# Patient Record
Sex: Male | Born: 1960
Health system: Southern US, Community
[De-identification: ages and names within clinical notes are randomized; demographics above are authoritative.]

## PROBLEM LIST (undated history)

## (undated) DIAGNOSIS — M1990 Unspecified osteoarthritis, unspecified site: Secondary | ICD-10-CM

## (undated) DIAGNOSIS — K649 Unspecified hemorrhoids: Secondary | ICD-10-CM

## (undated) DIAGNOSIS — C649 Malignant neoplasm of unspecified kidney, except renal pelvis: Secondary | ICD-10-CM

## (undated) DIAGNOSIS — K7689 Other specified diseases of liver: Secondary | ICD-10-CM

## (undated) HISTORY — PX: NECK SURGERY: SHX720

## (undated) HISTORY — DX: Malignant neoplasm of unspecified kidney, except renal pelvis: C64.9

---

## 2015-06-05 ENCOUNTER — Inpatient Hospital Stay
Admission: EM | Admit: 2015-06-05 | Discharge: 2015-06-07 | DRG: 700 | Disposition: A | Discharge status: Home / Self Care | Payer: BLUE CROSS/BLUE SHIELD | Attending: Internal Medicine | Admitting: Internal Medicine

## 2015-06-05 ENCOUNTER — Encounter: Payer: Self-pay | Admitting: Emergency Medicine

## 2015-06-05 DIAGNOSIS — N400 Enlarged prostate without lower urinary tract symptoms: Secondary | ICD-10-CM | POA: Diagnosis present

## 2015-06-05 DIAGNOSIS — Z833 Family history of diabetes mellitus: Secondary | ICD-10-CM

## 2015-06-05 DIAGNOSIS — N23 Unspecified renal colic: Secondary | ICD-10-CM | POA: Diagnosis present

## 2015-06-05 DIAGNOSIS — Z803 Family history of malignant neoplasm of breast: Secondary | ICD-10-CM

## 2015-06-05 DIAGNOSIS — K5900 Constipation, unspecified: Secondary | ICD-10-CM | POA: Diagnosis present

## 2015-06-05 DIAGNOSIS — N138 Other obstructive and reflux uropathy: Secondary | ICD-10-CM | POA: Diagnosis not present

## 2015-06-05 DIAGNOSIS — R310 Gross hematuria: Secondary | ICD-10-CM | POA: Diagnosis present

## 2015-06-05 DIAGNOSIS — N2889 Other specified disorders of kidney and ureter: Secondary | ICD-10-CM | POA: Diagnosis present

## 2015-06-05 DIAGNOSIS — R109 Unspecified abdominal pain: Secondary | ICD-10-CM | POA: Diagnosis not present

## 2015-06-05 DIAGNOSIS — R319 Hematuria, unspecified: Secondary | ICD-10-CM | POA: Diagnosis present

## 2015-06-05 DIAGNOSIS — K644 Residual hemorrhoidal skin tags: Secondary | ICD-10-CM | POA: Diagnosis present

## 2015-06-05 LAB — COMPREHENSIVE METABOLIC PANEL
ALT: 18 U/L (ref 17–63)
AST: 22 U/L (ref 15–41)
Albumin: 3.9 g/dL (ref 3.5–5.0)
Alkaline Phosphatase: 72 U/L (ref 38–126)
Anion gap: 9 (ref 5–15)
BUN: 16 mg/dL (ref 6–20)
CHLORIDE: 101 mmol/L (ref 101–111)
CO2: 26 mmol/L (ref 22–32)
Calcium: 8.8 mg/dL — ABNORMAL LOW (ref 8.9–10.3)
Creatinine, Ser: 1.75 mg/dL — ABNORMAL HIGH (ref 0.61–1.24)
GFR, EST AFRICAN AMERICAN: 50 mL/min — AB (ref >60)
GFR, EST NON AFRICAN AMERICAN: 43 mL/min — AB (ref >60)
Glucose, Bld: 177 mg/dL — ABNORMAL HIGH (ref 65–99)
POTASSIUM: 3.8 mmol/L (ref 3.5–5.1)
SODIUM: 136 mmol/L (ref 135–145)
Total Bilirubin: 0.9 mg/dL (ref 0.3–1.2)
Total Protein: 7.1 g/dL (ref 6.5–8.1)

## 2015-06-05 LAB — URINALYSIS COMPLETE WITH MICROSCOPIC (ARMC ONLY)
Bilirubin Urine: NEGATIVE
Glucose, UA: 150 mg/dL — AB
Ketones, ur: NEGATIVE mg/dL
LEUKOCYTES UA: NEGATIVE
Nitrite: NEGATIVE
PH: 6 (ref 5.0–8.0)
PROTEIN: 30 mg/dL — AB
SQUAMOUS EPITHELIAL / LPF: NONE SEEN
Specific Gravity, Urine: 1.017 (ref 1.005–1.030)

## 2015-06-05 LAB — CBC
HEMATOCRIT: 39.5 % — AB (ref 40.0–52.0)
Hemoglobin: 13.7 g/dL (ref 13.0–18.0)
MCH: 31.2 pg (ref 26.0–34.0)
MCHC: 34.6 g/dL (ref 32.0–36.0)
MCV: 90.2 fL (ref 80.0–100.0)
Platelets: 184 10*3/uL (ref 150–440)
RBC: 4.38 MIL/uL — AB (ref 4.40–5.90)
RDW: 13.2 % (ref 11.5–14.5)
WBC: 10.8 10*3/uL — AB (ref 3.8–10.6)

## 2015-06-05 LAB — LIPASE, BLOOD: LIPASE: 18 U/L — AB (ref 22–51)

## 2015-06-05 MED ORDER — ACETAMINOPHEN 650 MG RE SUPP: 650.0000 mg | Freq: Four times a day (QID) | RECTAL | Status: DC | PRN

## 2015-06-05 MED ORDER — ALBUTEROL SULFATE (2.5 MG/3ML) 0.083% IN NEBU: 2.5000 mg | INHALATION_SOLUTION | RESPIRATORY_TRACT | Status: DC | PRN

## 2015-06-05 MED ORDER — HYDROCODONE-ACETAMINOPHEN 5-325 MG PO TABS: 1.0000 | ORAL_TABLET | ORAL | Status: DC | PRN

## 2015-06-05 MED ORDER — POLYETHYLENE GLYCOL 3350 17 G PO PACK
17.0000 g | PACK | Freq: Every day | ORAL | Status: DC
  Administered 2015-06-05 – 2015-06-07 (×3): 17 g via ORAL
  Filled 2015-06-05 (×3): qty 1

## 2015-06-05 MED ORDER — ONDANSETRON HCL 4 MG PO TABS: 4.0000 mg | ORAL_TABLET | Freq: Four times a day (QID) | ORAL | Status: DC | PRN

## 2015-06-05 MED ORDER — ACETAMINOPHEN 10 MG/ML IV SOLN
1000.0000 mg | Freq: Four times a day (QID) | INTRAVENOUS | Status: AC
  Administered 2015-06-06 (×4): 1000 mg via INTRAVENOUS
  Filled 2015-06-05 (×4): qty 100

## 2015-06-05 MED ORDER — SENNOSIDES-DOCUSATE SODIUM 8.6-50 MG PO TABS
2.0000 | ORAL_TABLET | Freq: Every day | ORAL | Status: DC
  Administered 2015-06-05 – 2015-06-06 (×2): 2 via ORAL
  Filled 2015-06-05 (×2): qty 2

## 2015-06-05 MED ORDER — SODIUM CHLORIDE 0.9 % IV BOLUS (SEPSIS)
1000.0000 mL | Freq: Once | INTRAVENOUS | Status: AC
  Administered 2015-06-05: 1000 mL via INTRAVENOUS

## 2015-06-05 MED ORDER — ONDANSETRON HCL 4 MG/2ML IJ SOLN
4.0000 mg | INTRAMUSCULAR | Status: AC
  Administered 2015-06-05: 4 mg via INTRAVENOUS
  Filled 2015-06-05: qty 2

## 2015-06-05 MED ORDER — SODIUM CHLORIDE 0.9 % IV SOLN
INTRAVENOUS | Status: DC
  Administered 2015-06-06 – 2015-06-07 (×6): via INTRAVENOUS

## 2015-06-05 MED ORDER — SODIUM BICARBONATE 8.4 % IV SOLN
INTRAVENOUS | Status: DC
  Administered 2015-06-06 (×2): via INTRAVENOUS
  Filled 2015-06-05 (×6): qty 150

## 2015-06-05 MED ORDER — HYDROMORPHONE HCL 1 MG/ML IJ SOLN
1.0000 mg | INTRAMUSCULAR | Status: AC
  Administered 2015-06-05 (×2): 1 mg via INTRAVENOUS
  Filled 2015-06-05: qty 1

## 2015-06-05 MED ORDER — HYDROMORPHONE HCL 1 MG/ML IJ SOLN
1.0000 mg | INTRAMUSCULAR | Status: AC
  Administered 2015-06-05: 1 mg via INTRAVENOUS

## 2015-06-05 MED ORDER — MORPHINE SULFATE (PF) 2 MG/ML IV SOLN: 2.0000 mg | INTRAVENOUS | Status: DC | PRN

## 2015-06-05 MED ORDER — ONDANSETRON HCL 4 MG/2ML IJ SOLN: 4.0000 mg | Freq: Four times a day (QID) | INTRAMUSCULAR | Status: DC | PRN

## 2015-06-05 MED ORDER — ACETAMINOPHEN 325 MG PO TABS: 650.0000 mg | ORAL_TABLET | Freq: Four times a day (QID) | ORAL | Status: DC | PRN

## 2015-06-05 MED ORDER — HYDROMORPHONE HCL 2 MG PO TABS
2.0000 mg | ORAL_TABLET | ORAL | Status: DC | PRN
  Administered 2015-06-05 – 2015-06-06 (×2): 2 mg via ORAL
  Administered 2015-06-06: 13:00:00 4 mg via ORAL
  Administered 2015-06-06: 04:00:00 2 mg via ORAL
  Administered 2015-06-06 – 2015-06-07 (×5): 4 mg via ORAL
  Filled 2015-06-05: qty 1
  Filled 2015-06-05 (×2): qty 2
  Filled 2015-06-05: qty 1
  Filled 2015-06-05: qty 2
  Filled 2015-06-05: qty 1
  Filled 2015-06-05 (×3): qty 2

## 2015-06-05 MED ORDER — HYDROMORPHONE HCL 1 MG/ML IJ SOLN
INTRAMUSCULAR | Status: AC
  Administered 2015-06-05: 1 mg via INTRAVENOUS
  Filled 2015-06-05: qty 1

## 2015-06-05 MED ORDER — HYDROMORPHONE HCL 1 MG/ML IJ SOLN
0.5000 mg | INTRAMUSCULAR | Status: DC | PRN
  Administered 2015-06-06 – 2015-06-07 (×11): 0.5 mg via INTRAVENOUS
  Filled 2015-06-05 (×11): qty 1

## 2015-06-05 MED ORDER — SODIUM BICARBONATE 8.4 % IV SOLN
INTRAVENOUS | Status: DC
  Administered 2015-06-05: 23:00:00 via INTRAVENOUS
  Filled 2015-06-05 (×2): qty 50

## 2015-06-05 NOTE — H&P (Signed)
[facility] [place] NAME: Joseph May    MR#:  [id number]  DATE OF BIRTH:  01/11/1961  DATE OF ADMISSION:  06/05/2015  PRIMARY CARE PHYSICIAN: [name], MD   REQUESTING/REFERRING PHYSICIAN: Dr. Jacqualine Code  CHIEF COMPLAINT:   Chief Complaint  Patient presents with  . Flank Pain   left-sided flank pain and hematuria for 3 days.  HISTORY OF PRESENT ILLNESS:  Joseph May  is a 54 y.o. male with no past medical history. The patient to come to the ED due to worsening left-sided flank pain with hematuria. The patient traveled to [place] last week. He started to have left-sided flank pain and noticed the hematuria. CAT scan of abdomen and pelvis show left-sided kidney mass. He was treated with the pain control and IV fluid in [place] and was discharged. According to Dr. Jacqualine Code, on-call urologist suggested admitting patient, start bicarbonate and repeat CAT scan if renal function improves.  PAST MEDICAL HISTORY:  History reviewed. No pertinent past medical history.  PAST SURGICAL HISTORY:   Past Surgical History  Procedure Laterality Date  . Neck surgery      SOCIAL HISTORY:   Social History  Substance Use Topics  . Smoking status: Never Smoker   . Smokeless tobacco: Not on file  . Alcohol Use: Yes    FAMILY HISTORY:   Family History  Problem Relation Age of Onset  . Diabetes Mother   . Breast cancer Mother     DRUG ALLERGIES:  No Known Allergies  REVIEW OF SYSTEMS:  CONSTITUTIONAL: No fever, but has chills. No fatigue or weakness.  EYES: No blurred or double vision.  EARS, NOSE, AND THROAT: No tinnitus or ear pain.  RESPIRATORY: No cough, shortness of breath, wheezing or hemoptysis.  CARDIOVASCULAR: No chest pain, orthopnea, edema.  GASTROINTESTINAL: No nausea, vomiting, diarrhea but has left side abdominal pain.  GENITOURINARY: No dysuria, but has left-sided flank pain and hematuria.  ENDOCRINE: No polyuria,  nocturia,  HEMATOLOGY: No anemia, easy bruising or bleeding SKIN: No rash or lesion. MUSCULOSKELETAL: No joint pain or arthritis.   NEUROLOGIC: No tingling, numbness, weakness.  PSYCHIATRY: No anxiety or depression.   MEDICATIONS AT HOME:   Prior to Admission medications   Medication Sig Start Date End Date Taking? Authorizing Provider  acetaminophen (TYLENOL) 500 MG tablet Take 1,000 mg by mouth every 6 (six) hours as needed for mild pain or moderate pain.   Yes Historical Provider, MD  HYDROcodone-acetaminophen (NORCO) 7.5-325 MG per tablet Take 0.5-1 tablets by mouth every 4 (four) hours as needed for moderate pain or severe pain.   Yes Historical Provider, MD      VITAL SIGNS:  Blood pressure 163/99, pulse 110, temperature 99.9 F (37.7 C), temperature source Oral, resp. rate 20, height 6\' 1"  (1.854 m), weight 89.812 kg (198 lb), SpO2 99 %.  PHYSICAL EXAMINATION:  GENERAL:  55 y.o.-year-old patient lying in the bed with no acute distress.  EYES: Pupils equal, round, reactive to light and accommodation. No scleral icterus. Extraocular muscles intact.  HEENT: Head atraumatic, normocephalic. Oropharynx and nasopharynx clear.  NECK:  Supple, no jugular venous distention. No thyroid enlargement, no tenderness.  LUNGS: Normal breath sounds bilaterally, no wheezing, rales,rhonchi or crepitation. No use of accessory muscles of respiration.  CARDIOVASCULAR: S1, S2 normal. No murmurs, rubs, or gallops.  ABDOMEN: Soft, nontender, nondistended. Bowel sounds present. No organomegaly or mass.  EXTREMITIES: No pedal edema, cyanosis, or clubbing.  NEUROLOGIC: Cranial nerves II  through XII are intact. Muscle strength 5/5 in all extremities. Sensation intact. Gait not checked.  PSYCHIATRIC: The patient is alert and oriented x 3.  SKIN: No obvious rash, lesion, or ulcer.   LABORATORY PANEL:   CBC  Recent Labs Lab 06/05/15 1838  WBC 10.8*  HGB 13.7  HCT 39.5*  PLT 184    ------------------------------------------------------------------------------------------------------------------  Chemistries   Recent Labs Lab 06/05/15 1838  NA 136  K 3.8  CL 101  CO2 26  GLUCOSE 177*  BUN 16  CREATININE 1.75*  CALCIUM 8.8*  AST 22  ALT 18  ALKPHOS 72  BILITOT 0.9   ------------------------------------------------------------------------------------------------------------------  Cardiac Enzymes No results for input(s): TROPONINI in the last 168 hours. ------------------------------------------------------------------------------------------------------------------  RADIOLOGY:  No results found. CAT scan of abdomen and pelvis without IV contrast in [place] show thrombosis in the left intrarenal collecting system and suspected cortical or mass left kidney.  EKG:  No orders found for this or any previous visit.  IMPRESSION AND PLAN:   Left kidney mass Left mild hydronephrosis Acute renal failure Hematuria  The patient will be admitted to medical floor. I will keep patient nothing by mouth except for medications and water. I will start bicarbonate infusion per urologist the recommendation. Per on-call urologist, the patient need CAT scan of the abdomen and pelvis with contrast is bone kidney function improves tomorrow. Pain control and Zofran when necessary.  All the records are reviewed and case discussed with ED provider. Management plans discussed with the patient, family and they are in agreement.  CODE STATUS: Full code  TOTAL TIME TAKING CARE OF THIS PATIENT: 52 minutes.    Demetrios Loll M.D on 06/05/2015 at 9:00 PM  Between 7am to 6pm - Pager - [phone]  After 6pm go to [email or web address] - password [email or web address] [facility]  Office  [phone]  CC: Primary care physician; [name], MD

## 2015-06-05 NOTE — ED Notes (Signed)
Patient to ED with report of left flank pain that started on Friday, patient was seen in [place] and has paperwork that shows the details of admission. Wife reports that patient had been urinating blood and they assumed it was a kidney stone but they found a thrombus in the left intrarenal area as well as a suspected cortical mass of left kidney.

## 2015-06-05 NOTE — ED Notes (Signed)
Pt has left flank pain.  Pt was seen in hospital in [place] 2 days ago while on vacation.  Pt has disc and ct report...given to dr Jacqualine Code..  Pt taking hydrocodone without relief of pain.  No n/v/d.  Pt continues to have blood in urine.  Iv in place.  Family with pt.

## 2015-06-05 NOTE — ED Notes (Signed)
meds given   siderails up x 2 

## 2015-06-05 NOTE — ED Provider Notes (Signed)
[facility] Emergency Department Provider Note REMINDER - THIS NOTE IS NOT A FINAL MEDICAL RECORD UNTIL IT IS SIGNED. UNTIL THEN, THE CONTENT BELOW MAY REFLECT INFORMATION FROM A DOCUMENTATION TEMPLATE, NOT THE ACTUAL PATIENT VISIT. ____________________________________________  Time seen: Approximately 8:43 PM  I have reviewed the triage vital signs and the nursing notes.   HISTORY  Chief Complaint Flank Pain    HPI Joseph May is a 54 y.o. male presents for evaluation of a left kidney mass and left-sided pain. Patient reports that about 3 days ago in [place] he developed sudden onset of blood and clots in his urine with severe left flank pain. He was evaluated and admitted for concerns of a left renal mass and bleeding. He drove home from [place] and continues to have severe pain in the left flank. The patient denies fevers, occasionally feels slightly chilled. No nausea or vomiting. No previous medical history. No allergies. Reports is hydrocodone does not assist much with pain control.  Patient has CT report which is concerning for a possible, though not well visualized left cortical renal mass.  History reviewed. No pertinent past medical history.  Patient Active Problem List   Diagnosis Date Noted  . Left kidney mass 06/05/2015  . Hematuria 06/05/2015    Past Surgical History  Procedure Laterality Date  . Neck surgery      Current Outpatient Rx  Name  Route  Sig  Dispense  Refill  . acetaminophen (TYLENOL) 500 MG tablet   Oral   Take 1,000 mg by mouth every 6 (six) hours as needed for mild pain or moderate pain.         Marland Kitchen HYDROcodone-acetaminophen (NORCO) 7.5-325 MG per tablet   Oral   Take 0.5-1 tablets by mouth every 4 (four) hours as needed for moderate pain or severe pain.           Allergies Review of patient's allergies indicates no known allergies.  Family History  Problem Relation Age of Onset  . Diabetes Mother   .  Breast cancer Mother     Social History Social History  Substance Use Topics  . Smoking status: Never Smoker   . Smokeless tobacco: None  . Alcohol Use: Yes    Review of Systems Constitutional: No fever Eyes: No visual changes. ENT: No sore throat. Cardiovascular: Denies chest pain. Respiratory: Denies shortness of breath. Gastrointestinal:  No diarrhea.  No constipation. Genitourinary: Urgency, is able to urinate and states it is bloody but not passing any further clots. Musculoskeletal: Negative for back pain. Skin: Negative for rash. Neurological: Negative for headaches, focal weakness or numbness.  10-point ROS otherwise negative.  ____________________________________________   PHYSICAL EXAM:  VITAL SIGNS: ED Triage Vitals  Enc Vitals Group     BP 06/05/15 1835 146/84 mmHg     Pulse Rate 06/05/15 1835 104     Resp 06/05/15 1835 20     Temp 06/05/15 1835 99.9 F (37.7 C)     Temp Source 06/05/15 1835 Oral     SpO2 06/05/15 1835 99 %     Weight 06/05/15 1835 198 lb (89.812 kg)     Height 06/05/15 1835 6\' 1"  (1.854 m)     Head Cir --      Peak Flow --      Pain Score 06/05/15 1835 8     Pain Loc --      Pain Edu? --      Excl. in [place]? --  Constitutional: Alert and oriented. Well appearing and in no acute distress, but he does appear to be in moderate to severe pain. Eyes: Conjunctivae are normal. PERRL. EOMI. Head: Atraumatic. Nose: No congestion/rhinnorhea. Mouth/Throat: Mucous membranes are moist.  Oropharynx non-erythematous. Neck: No stridor.   Cardiovascular: Normal rate, regular rhythm. Grossly normal heart sounds.  Good peripheral circulation. Respiratory: Normal respiratory effort.  No retractions. Lungs CTAB. Gastrointestinal: Soft and nontender except for along the left flank. No distention. No abdominal bruits. Moderate left-sided CVA tenderness. No right-sided CVA tenderness. Musculoskeletal: No lower extremity tenderness nor edema.  No joint  effusions. Neurologic:  Normal speech and language. No gross focal neurologic deficits are appreciated. No gait instability. Skin:  Skin is warm, dry and intact. No rash noted. Psychiatric: Mood and affect are normal. Speech and behavior are normal.  ____________________________________________   LABS (all labs ordered are listed, but only abnormal results are displayed)  Labs Reviewed  LIPASE, BLOOD - Abnormal; Notable for the following:    Lipase 18 (*)    All other components within normal limits  COMPREHENSIVE METABOLIC PANEL - Abnormal; Notable for the following:    Glucose, Bld 177 (*)    Creatinine, Ser 1.75 (*)    Calcium 8.8 (*)    GFR calc non Af Amer 43 (*)    GFR calc Af Amer 50 (*)    All other components within normal limits  CBC - Abnormal; Notable for the following:    WBC 10.8 (*)    RBC 4.38 (*)    HCT 39.5 (*)    All other components within normal limits  URINALYSIS COMPLETEWITH MICROSCOPIC ([facility] ONLY) - Abnormal; Notable for the following:    Color, Urine YELLOW (*)    APPearance CLOUDY (*)    Glucose, UA 150 (*)    Hgb urine dipstick 3+ (*)    Protein, ur 30 (*)    Bacteria, UA RARE (*)    All other components within normal limits  URINE CULTURE   ____________________________________________  EKG   ____________________________________________  RADIOLOGY  We discussed imaging with Dr. Louis Meckel of urology, and given the patient's mild left-sided hydronephrosis and elevated creatinine he advised hydration and admission and advised against contrast-enhanced imaging, but would advise imaging possibly tomorrow if renal function improving. ____________________________________________   PROCEDURES  Procedure(s) performed: None  Critical Care performed: No  ____________________________________________   INITIAL IMPRESSION / ASSESSMENT AND PLAN / ED COURSE  Pertinent labs & imaging results that were available during my care of the patient were  reviewed by me and considered in my medical decision making (see chart for details).  Patient presents with ongoing severe left flank pain. Respiratory admitted in [place], and CT report that he sends is certainly concerning for renal mass. Dr. Louis Meckel of urology will see him. He has a low-grade temperature, though not conclusively a fever. Urinalysis was discussed with urology and doesn't frankly look to be infected. We will admit the patient to the hospital for ongoing urologic consultation, hydration, and further workup. He has significant pain issues, which is improving slowly with a lot of but will also require IV medication for pain control at this time. ____________________________________________   FINAL CLINICAL IMPRESSION(S) / ED DIAGNOSES  Final diagnoses:  Left renal mass  Left flank pain      Delman Kitten, MD 06/05/15 2122

## 2015-06-05 NOTE — Plan of Care (Signed)
Problem: Discharge Progression Outcomes Goal: Other Discharge Outcomes/Goals Outcome: Progressing Plan of care progress to goals: Discharge plan- discharge home, possibly in the AM per Dr. Louis Meckel Pain controlled- pt c/o left flank pain improved with PRN pain medications. Hemodynamically stable- VSS, continue to monitor labs. Complications controlled- Dr. Louis Meckel in to see patient, discussed MRI in the AM and possible discharge.  C/o constipation, laxatives ordered by MD and given per order, continue to monitor for results. Tolerating diet- no c/o nausea or vomiting  Activity- pt is ambulatory with steady gait.   Pt is A&O, wife was present during admission but then went home and will return in the AM. Pt c/o left flank pain, improved with PRN pain medication. C/o constipation, laxatives given per order, continue to monitor for results. MRI scheduled for in the AM. IVF infusing per MD order. Low Fall risk, pt will call if assistance is needed.

## 2015-06-05 NOTE — ED Notes (Signed)
Dr Jacqualine Code in with pt again. meds given again for pain.  Iv fluids infusing.

## 2015-06-05 NOTE — Consult Note (Signed)
I have been asked to see the patient by Dr. Lauralyn Primes, for evaluation and management of left renal mass.  History of present illness: 54 year old otherwise healthy white male who presented to the emergency department this afternoon with ongoing/progressive left flank pain. The patient was recently seen in an outside hospital, [place], for gross hematuria and left flank pain. He was admitted there for 1 night and underwent a noncontrast CT scan which demonstrated a suspicious renal lesion, likely cortical, in the left kidney. There appeared to be a density within the collecting system consistent with clot. The patient's kidney function was slightly diminished, and as such she was not given contrast. The patient was given pain medications and recommended that he follow-up closer to home, here in [place]. Currently, the patient states that he has had consistent left flank pressure/pain. The pain does not radiate down across the abdomen into the groin. He has no associated voiding symptoms. His urine has cleared for the most part, he is no longer passing hematuria or clots. However, he has had no relief of his pain since 2 days prior. The Dilaudid oral pain medication seems to be controlling his pain reasonably well. Yet, this afternoon his pain was significantly worse. The patient denies any associated fevers or chills. He denies any dysuria. He denies any abdominal pain. He denies any recent weight loss, night sweats, or diminished energy levels. He denies any changes to his bowel habits, visual changes or headaches, or muscle weakness. The patient's last bowel movement was 4 days prior. He denies any nausea or vomiting. The patient has no family history of kidney cancer or GU malignancies. He is a lifelong nonsmoker. He takes no medications and has no past medical history. He works as a Furniture conservator/restorer, Warden/ranger particles routinely. He has no exposure to analine dyes or organic  solvents. He is married and has one daughter, and one grandson. His wife is an Glass blower/designer at [employer], works predominantly at [place]: A 12 point comprehensive review of systems was obtained and is negative unless otherwise stated in the history of present illness.  Patient Active Problem List   Diagnosis Date Noted  . Left kidney mass 06/05/2015  . Hematuria 06/05/2015    No current facility-administered medications on file prior to encounter.   No current outpatient prescriptions on file prior to encounter.    History reviewed. No pertinent past medical history.  Past Surgical History  Procedure Laterality Date  . Neck surgery      Social History  Substance Use Topics  . Smoking status: Never Smoker   . Smokeless tobacco: None  . Alcohol Use: Yes    Family History  Problem Relation Age of Onset  . Diabetes Mother   . Breast cancer Mother     PE: Filed Vitals:   06/05/15 2015 06/05/15 2030 06/05/15 2100 06/05/15 2132  BP: 151/95 146/94 150/91 188/83  Pulse: 108 105 108 120  Temp:    100 F (37.8 C)  TempSrc:    Oral  Resp:    24  Height:    6\' 1"  (1.854 m)  Weight:      SpO2: 99% 94% 97% 100%   Patient appears to be in no acute distress  patient is alert and oriented x3 Atraumatic normocephalic head No cervical or supraclavicular lymphadenopathy appreciated No increased work of breathing, no audible wheezes/rhonchi Regular sinus rhythm/rate Abdomen is soft, nontender, nondistended, no CVA or suprapubic tenderness  Lower extremities are symmetric without appreciable edema Grossly neurologically intact No identifiable skin lesions   Recent Labs  06/05/15 1838  WBC 10.8*  HGB 13.7  HCT 39.5*    Recent Labs  06/05/15 1838  NA 136  K 3.8  CL 101  CO2 26  GLUCOSE 177*  BUN 16  CREATININE 1.75*  CALCIUM 8.8*   No results for input(s): LABPT, INR in the last 72 hours. No results for input(s): LABURIN in the last 72  hours. No results found for this or any previous visit.  Imaging: I've independently reviewed the patient's CT scan from the outside hospital which is a noncontrast abdomen/pelvis. There is what appears to be a 5 cm intraoral polar mass emanating from the renal cortex, however there is no contrast making the distinction difficult to visualize. There is a increased density within the renal pelvis. There is no ureterohydronephrosis. The right kidney looks normal. There are no kidney stones. There is no appreciable lymphadenopathy. 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  Imp: The patient has a left renal mass poorly characterized on the current noncontrast CT scan. He has associated renal colic from the hematuria presumably coming from the mass. This likely happened because of dehydration while on vacation at the [place] in [place].  Recommendations: The patient certainly needs more definitive imaging. Given the densities within the collecting system as well as his complex vasculature and his elevated creatinine I recommend the patient undergo a MRI- urogram. We will hydrate the patient tonight and obtain these images tomorrow. In the meantime, the patient can eat as we are not planning to take the patient to interventional radiology tonight or to the operating room for any surgeries or procedures. Further, given that we are not planning to perform a CT scan, the patient does not need to be on a bicarbonate drip. He should finish the bicarb drip that he currently has and then he can be switched to normal saline. I also ordered IV Tylenol for the patient for better pain control along with oral Dilaudid. Hopefully this will be enough for him so that he can be discharged tomorrow on oral Dilaudid. Finally, I ordered the patient an aggressive bowel regimen with miralax and Senokot S.  I instructed the patient to take Miralax  upon discharge.  One of my partners from [facility] will follow up with the patient tomorrow after his MRI. Hopefully, the patient will be discharged tomorrow afternoon, and I will then follow up with him on Tuesday in the [facility] office.   Thank you for involving me in this patient's care, we will continue to follow.  Louis Meckel W

## 2015-06-06 ENCOUNTER — Inpatient Hospital Stay: Payer: BLUE CROSS/BLUE SHIELD

## 2015-06-06 LAB — CBC
HCT: 36.4 % — ABNORMAL LOW (ref 40.0–52.0)
HEMOGLOBIN: 12.3 g/dL — AB (ref 13.0–18.0)
MCH: 30.6 pg (ref 26.0–34.0)
MCHC: 33.9 g/dL (ref 32.0–36.0)
MCV: 90.4 fL (ref 80.0–100.0)
PLATELETS: 172 10*3/uL (ref 150–440)
RBC: 4.02 MIL/uL — AB (ref 4.40–5.90)
RDW: 13.2 % (ref 11.5–14.5)
WBC: 8.3 10*3/uL (ref 3.8–10.6)

## 2015-06-06 LAB — BASIC METABOLIC PANEL
ANION GAP: 8 (ref 5–15)
BUN: 15 mg/dL (ref 6–20)
CALCIUM: 8.4 mg/dL — AB (ref 8.9–10.3)
CHLORIDE: 100 mmol/L — AB (ref 101–111)
CO2: 29 mmol/L (ref 22–32)
CREATININE: 1.73 mg/dL — AB (ref 0.61–1.24)
GFR calc non Af Amer: 44 mL/min — ABNORMAL LOW (ref >60)
GFR, EST AFRICAN AMERICAN: 51 mL/min — AB (ref >60)
Glucose, Bld: 113 mg/dL — ABNORMAL HIGH (ref 65–99)
Potassium: 3.8 mmol/L (ref 3.5–5.1)
SODIUM: 137 mmol/L (ref 135–145)

## 2015-06-06 MED ORDER — GADOBENATE DIMEGLUMINE 529 MG/ML IV SOLN
20.0000 mL | Freq: Once | INTRAVENOUS | Status: AC | PRN
  Administered 2015-06-06: 20 mL via INTRAVENOUS

## 2015-06-06 NOTE — Plan of Care (Signed)
Problem: Discharge Progression Outcomes Goal: Other Discharge Outcomes/Goals Outcome: Progressing Plan of care progress to goals: Discharge plan- discharge home, possibly in the AM per Dr. Louis Meckel Pain controlled- pt c/o left flank pain improved with PRN pain medications. Hemodynamically stable- VSS, continue to monitor labs. Complications controlled- Dr. Louis Meckel in to see patient, discussed MRI in the AM and possible discharge.  C/o constipation, laxatives ordered by MD and given per order, continue to monitor for results. Tolerating diet- no c/o nausea or vomiting   Activity- pt is ambulatory with steady gait.    Pt is A&O, wife was present during admission but then went home and will return in the AM. Pt c/o left flank pain, improved with PRN pain medication. C/o constipation, laxatives given per order, continue to monitor for results. MRI scheduled for in the AM. IVF infusing per MD order. Low Fall risk, pt will call if assistance is needed.

## 2015-06-06 NOTE — Plan of Care (Signed)
Problem: Discharge Progression Outcomes Goal: Other Discharge Outcomes/Goals Outcome: Progressing Plan of care progress to goal for: 1. Pain-pt c/o pain in left flank, prn meds given with improvement 2. Hemodynamically-             -VSS, pt remains afebrile this shift             -IV fluids continue per orders 3. Complications-no evidence of this shift 4. Diet-pt tolerating diet this shift 5. Activity-pt up in room unassisted with wife at bedside

## 2015-06-06 NOTE — Progress Notes (Signed)
Subjective: Patient reports he has left flank pain about the same. He hasn't ambulated much. No BM in a few days. He is voiding without difficulty and reports his urine has cleared.    Objective: Vital signs in last 24 hours: Temp:  [98.8 F (37.1 C)-100 F (37.8 C)] 98.8 F (37.1 C) (09/12 1307) Pulse Rate:  [101-120] 101 (09/12 1307) Resp:  [20-24] 20 (09/12 1307) BP: (146-188)/(83-99) 152/92 mmHg (09/12 1307) SpO2:  [94 %-100 %] 97 % (09/12 1307) Weight:  [89.812 kg (198 lb)-95.528 kg (210 lb 9.6 oz)] 95.528 kg (210 lb 9.6 oz) (09/12 0514)  Intake/Output from previous day: 09/11 0701 - 09/12 0700 In: 1243 [I.V.:1043; IV Piggyback:200] Out: 1700 [Urine:1700] Intake/Output this shift: Total I/O In: 563.3 [I.V.:463.3; IV Piggyback:100] Out: 1450 [Urine:1450]  Physical Exam:    Lab Results:  Recent Labs  06/05/15 1838 06/06/15 0518  HGB 13.7 12.3*  HCT 39.5* 36.4*   BMET  Recent Labs  06/05/15 1838 06/06/15 0518  NA 136 137  K 3.8 3.8  CL 101 100*  CO2 26 29  GLUCOSE 177* 113*  BUN 16 15  CREATININE 1.75* 1.73*  CALCIUM 8.8* 8.4*   No results for input(s): LABPT, INR in the last 72 hours. No results for input(s): LABURIN in the last 72 hours. No results found for this or any previous visit.  Studies/Results: Mr Pelvis W Wo Contrast  06/06/2015   CLINICAL DATA:  Left-sided flank pain for 3 days. Left renal mass on CT. Hematuria.  EXAM: MRI ABDOMEN WITHOUT AND WITH CONTRAST  TECHNIQUE: Multiplanar multisequence MR imaging of the abdomen was performed both before and after the administration of intravenous contrast.  CONTRAST:  42mL MULTIHANCE GADOBENATE DIMEGLUMINE 529 MG/ML IV SOLN  COMPARISON:  Outside CT of 06/03/2015.  FINDINGS: Portions of exam are mildly motion degraded.  Lower chest: Normal heart size without pericardial or pleural effusion. Left base atelectasis.  Hepatobiliary: Tiny left hepatic lobe cyst. Normal gallbladder, without biliary ductal  dilatation.  Pancreas: Normal, without mass or ductal dilatation.  Spleen: Normal  Adrenals/Urinary Tract: Normal adrenal glands. Normal right kidney, without hydronephrosis. An anterior interpolar left renal mass measures in 5.2 x 3.8 cm on image 68 of series 14. 4.1 cm craniocaudal on image 29 of series 18. Mildly T2 hypo intense. Demonstrates heterogeneous post-contrast enhancement.  There is hemorrhage in the left renal collecting system and proximal left ureter. New or increased mild left-sided hydroureteronephrosis. Example image 8 of series 2. No obstructive mass identified. Normal urinary bladder.  Stomach/Bowel: Normal stomach, without wall thickening. Normal large and small bowel loops.  Vascular/Lymphatic: Normal caliber of the aorta and branch vessels. Single renal arteries bilaterally. Duplicated IVC. There are likely 2 accessory left lower pole renal veins, entering the left-sided IVC. Example images 68 and 76 of series 14. No renal vein thrombus. No retroperitoneal or retrocrural adenopathy.  Reproductive: Normal prostate.  Other: Development of small volume free pelvic fluid, including on image 17 of series 6.  Musculoskeletal: No acute osseous abnormality.  IMPRESSION: 1. Enhancing left renal mass, consistent with renal cell carcinoma. 2. No renal vein involvement or evidence of metastatic disease within the abdomen/pelvis. 3. New or increased mild left-sided hydroureteronephrosis, likely secondary to hemorrhage within the left renal collecting system. 4. Mildly motion degraded exam. 5. New small volume pelvic fluid. 6. 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   Electronically Signed   By: Abigail Miyamoto M.D.   On: 06/06/2015 10:35  Mr Abdomen W Wo Contrast  06/06/2015   CLINICAL DATA:  Left-sided flank pain for 3 days. Left renal mass on CT. Hematuria.  EXAM: MRI ABDOMEN WITHOUT AND WITH CONTRAST  TECHNIQUE: Multiplanar multisequence MR imaging of the abdomen was  performed both before and after the administration of intravenous contrast.  CONTRAST:  77mL MULTIHANCE GADOBENATE DIMEGLUMINE 529 MG/ML IV SOLN  COMPARISON:  Outside CT of 06/03/2015.  FINDINGS: Portions of exam are mildly motion degraded.  Lower chest: Normal heart size without pericardial or pleural effusion. Left base atelectasis.  Hepatobiliary: Tiny left hepatic lobe cyst. Normal gallbladder, without biliary ductal dilatation.  Pancreas: Normal, without mass or ductal dilatation.  Spleen: Normal  Adrenals/Urinary Tract: Normal adrenal glands. Normal right kidney, without hydronephrosis. An anterior interpolar left renal mass measures in 5.2 x 3.8 cm on image 68 of series 14. 4.1 cm craniocaudal on image 29 of series 18. Mildly T2 hypo intense. Demonstrates heterogeneous post-contrast enhancement.  There is hemorrhage in the left renal collecting system and proximal left ureter. New or increased mild left-sided hydroureteronephrosis. Example image 8 of series 2. No obstructive mass identified. Normal urinary bladder.  Stomach/Bowel: Normal stomach, without wall thickening. Normal large and small bowel loops.  Vascular/Lymphatic: Normal caliber of the aorta and branch vessels. Single renal arteries bilaterally. Duplicated IVC. There are likely 2 accessory left lower pole renal veins, entering the left-sided IVC. Example images 68 and 76 of series 14. No renal vein thrombus. No retroperitoneal or retrocrural adenopathy.  Reproductive: Normal prostate.  Other: Development of small volume free pelvic fluid, including on image 17 of series 6.  Musculoskeletal: No acute osseous abnormality.  IMPRESSION: 1. Enhancing left renal mass, consistent with renal cell carcinoma. 2. No renal vein involvement or evidence of metastatic disease within the abdomen/pelvis. 3. New or increased mild left-sided hydroureteronephrosis, likely secondary to hemorrhage within the left renal collecting system. 4. Mildly motion degraded  exam. 5. New small volume pelvic fluid. 6. 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   Electronically Signed   By: Abigail Miyamoto M.D.   On: 06/06/2015 10:35   I reviewed the images.   Assessment/Plan:  Left renal mass-mostly endophytic, enhancing measuring 5.2 x 3.8 cm x 4.1 cm. No evidence of locally advanced or metastatic disease. There is some hemorrhage into the left renal collecting system and proximal left ureter but overall the collecting system appears well-preserved. His hemoglobin and hematocrit are down a slight amount. His kidney function is stable to slightly better. Will need left radical nephrectomy possible partial nephrectomy but that may be difficult as the mass is interpolar, mostly endophytic and abuts the collecting system. I discussed with the patient and his wife the nature of renal masses (could be BENIGN or Malignant). Discussed nature r/b/ of surveillance, partial nx, radical nx. Discussed role of renal mass biopsy. I drew them a picture of the anatomy. All questions answered.   Left flank pain - persistent. Pt with only mild hydronephrosis on CT. Will monitor. Do not think he needs a ureteral stent at this point.   Gross hematuria - likely from left renal mass. Per pt his urine is now clear which is a good sign. Vitals stable. HR down some. H/H pending in AM. Do not think he needs embolization at this point.   Patient can be d/c'd to home when pain controlled and h/h stable. He has a f/u appt at [facility] tomorrow at 1:30 PM to discuss nephrectomy with  Dr. Louis Meckel. Nephrectomy would be in a few weeks. Will follow.     LOS: 1 day   Sanaa Zilberman 06/06/2015

## 2015-06-06 NOTE — Progress Notes (Signed)
[facility] [place] NAME: Joseph May    MR#:  [id number]  DATE OF BIRTH:  [birth year]  SUBJECTIVE:  CHIEF COMPLAINT:   Chief Complaint  Patient presents with  . Flank Pain   patient still having some left-sided flank pain but it's improved. He and hematuria improved. Hemoglobin stable. -Patient had some hemorrhoidal bleeding earlier today and that also resolved. Family at bedside  REVIEW OF SYSTEMS:    Review of Systems  Constitutional: Negative for fever and chills.  HENT: Negative for congestion and tinnitus.   Eyes: Negative for blurred vision and double vision.  Respiratory: Negative for cough, shortness of breath and wheezing.   Cardiovascular: Negative for chest pain, orthopnea and PND.  Gastrointestinal: Negative for nausea, vomiting, abdominal pain and diarrhea.  Genitourinary: Positive for hematuria (improved) and flank pain (left-sided). Negative for dysuria.  Neurological: Negative for dizziness, sensory change and focal weakness.  All other systems reviewed and are negative.   Nutrition: Regular Tolerating Diet: Yes Tolerating PT: Ambulatory     DRUG ALLERGIES:  No Known Allergies  VITALS:  Blood pressure 152/92, pulse 101, temperature 98.8 F (37.1 C), temperature source Oral, resp. rate 20, height 6\' 1"  (1.854 m), weight 95.528 kg (210 lb 9.6 oz), SpO2 97 %.  PHYSICAL EXAMINATION:   Physical Exam  GENERAL:  54 y.o.-year-old patient lying in the bed with no acute distress.  EYES: Pupils equal, round, reactive to light and accommodation. No scleral icterus. Extraocular muscles intact.  HEENT: Head atraumatic, normocephalic. Oropharynx and nasopharynx clear.  NECK:  Supple, no jugular venous distention. No thyroid enlargement, no tenderness.  LUNGS: Normal breath sounds bilaterally, no wheezing, rales, rhonchi. No use of accessory muscles of respiration.  CARDIOVASCULAR: S1, S2 normal. No murmurs, rubs,  or gallops.  ABDOMEN: Soft, slightly distended, left-sided flank pain. Bowel sounds present. No organomegaly or mass.  EXTREMITIES: No cyanosis, clubbing or edema b/l.    NEUROLOGIC: Cranial nerves II through XII are intact. No focal Motor or sensory deficits b/l.   PSYCHIATRIC: The patient is alert and oriented x 3. Good affect SKIN: No obvious rash, lesion, or ulcer.    LABORATORY PANEL:   CBC  Recent Labs Lab 06/06/15 0518  WBC 8.3  HGB 12.3*  HCT 36.4*  PLT 172   ------------------------------------------------------------------------------------------------------------------  Chemistries   Recent Labs Lab 06/05/15 1838 06/06/15 0518  NA 136 137  K 3.8 3.8  CL 101 100*  CO2 26 29  GLUCOSE 177* 113*  BUN 16 15  CREATININE 1.75* 1.73*  CALCIUM 8.8* 8.4*  AST 22  --   ALT 18  --   ALKPHOS 72  --   BILITOT 0.9  --    ------------------------------------------------------------------------------------------------------------------  Cardiac Enzymes No results for input(s): TROPONINI in the last 168 hours. ------------------------------------------------------------------------------------------------------------------  RADIOLOGY:  Mr Pelvis W Wo Contrast  06/06/2015   CLINICAL DATA:  Left-sided flank pain for 3 days. Left renal mass on CT. Hematuria.  EXAM: MRI ABDOMEN WITHOUT AND WITH CONTRAST  TECHNIQUE: Multiplanar multisequence MR imaging of the abdomen was performed both before and after the administration of intravenous contrast.  CONTRAST:  12mL MULTIHANCE GADOBENATE DIMEGLUMINE 529 MG/ML IV SOLN  COMPARISON:  Outside CT of 06/03/2015.  FINDINGS: Portions of exam are mildly motion degraded.  Lower chest: Normal heart size without pericardial or pleural effusion. Left base atelectasis.  Hepatobiliary: Tiny left hepatic lobe cyst. Normal gallbladder, without biliary ductal dilatation.  Pancreas: Normal, without mass  or ductal dilatation.  Spleen: Normal   Adrenals/Urinary Tract: Normal adrenal glands. Normal right kidney, without hydronephrosis. An anterior interpolar left renal mass measures in 5.2 x 3.8 cm on image 68 of series 14. 4.1 cm craniocaudal on image 29 of series 18. Mildly T2 hypo intense. Demonstrates heterogeneous post-contrast enhancement.  There is hemorrhage in the left renal collecting system and proximal left ureter. New or increased mild left-sided hydroureteronephrosis. Example image 8 of series 2. No obstructive mass identified. Normal urinary bladder.  Stomach/Bowel: Normal stomach, without wall thickening. Normal large and small bowel loops.  Vascular/Lymphatic: Normal caliber of the aorta and branch vessels. Single renal arteries bilaterally. Duplicated IVC. There are likely 2 accessory left lower pole renal veins, entering the left-sided IVC. Example images 68 and 76 of series 14. No renal vein thrombus. No retroperitoneal or retrocrural adenopathy.  Reproductive: Normal prostate.  Other: Development of small volume free pelvic fluid, including on image 17 of series 6.  Musculoskeletal: No acute osseous abnormality.  IMPRESSION: 1. Enhancing left renal mass, consistent with renal cell carcinoma. 2. No renal vein involvement or evidence of metastatic disease within the abdomen/pelvis. 3. New or increased mild left-sided hydroureteronephrosis, likely secondary to hemorrhage within the left renal collecting system. 4. Mildly motion degraded exam. 5. New small volume pelvic fluid. 6. 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   Electronically Signed   By: Abigail Miyamoto M.D.   On: 06/06/2015 10:35   Mr Abdomen W Wo Contrast  06/06/2015   CLINICAL DATA:  Left-sided flank pain for 3 days. Left renal mass on CT. Hematuria.  EXAM: MRI ABDOMEN WITHOUT AND WITH CONTRAST  TECHNIQUE: Multiplanar multisequence MR imaging of the abdomen was performed both before and after the administration of intravenous contrast.  CONTRAST:   76mL MULTIHANCE GADOBENATE DIMEGLUMINE 529 MG/ML IV SOLN  COMPARISON:  Outside CT of 06/03/2015.  FINDINGS: Portions of exam are mildly motion degraded.  Lower chest: Normal heart size without pericardial or pleural effusion. Left base atelectasis.  Hepatobiliary: Tiny left hepatic lobe cyst. Normal gallbladder, without biliary ductal dilatation.  Pancreas: Normal, without mass or ductal dilatation.  Spleen: Normal  Adrenals/Urinary Tract: Normal adrenal glands. Normal right kidney, without hydronephrosis. An anterior interpolar left renal mass measures in 5.2 x 3.8 cm on image 68 of series 14. 4.1 cm craniocaudal on image 29 of series 18. Mildly T2 hypo intense. Demonstrates heterogeneous post-contrast enhancement.  There is hemorrhage in the left renal collecting system and proximal left ureter. New or increased mild left-sided hydroureteronephrosis. Example image 8 of series 2. No obstructive mass identified. Normal urinary bladder.  Stomach/Bowel: Normal stomach, without wall thickening. Normal large and small bowel loops.  Vascular/Lymphatic: Normal caliber of the aorta and branch vessels. Single renal arteries bilaterally. Duplicated IVC. There are likely 2 accessory left lower pole renal veins, entering the left-sided IVC. Example images 68 and 76 of series 14. No renal vein thrombus. No retroperitoneal or retrocrural adenopathy.  Reproductive: Normal prostate.  Other: Development of small volume free pelvic fluid, including on image 17 of series 6.  Musculoskeletal: No acute osseous abnormality.  IMPRESSION: 1. Enhancing left renal mass, consistent with renal cell carcinoma. 2. No renal vein involvement or evidence of metastatic disease within the abdomen/pelvis. 3. New or increased mild left-sided hydroureteronephrosis, likely secondary to hemorrhage within the left renal collecting system. 4. Mildly motion degraded exam. 5. New small volume pelvic fluid. 6. Duplicated IVC, with accessory left renal veins  entering the left IVC.   Electronically Signed   By: Abigail Miyamoto M.D.   On: 06/06/2015 10:35     ASSESSMENT AND PLAN:   54 year old male with no significant past medical history who presented to the hospital with left-sided flank pain and hematuria. Patient on CT scan was noted to have a possible left-sided renal mass suspicious for malignancy.  #1 left-sided renal mass-likely the cause of patient's left-sided flank pain, hematuria. -Patient had MRI of the abdomen and pelvis showing a left-sided renal mass consistent with possible renal cell carcinoma. -Patient likely will need surgical intervention but will await further urology input.  #2 left-sided flank pain-likely secondary to renal mass and suspected malignancy. -Continue pain control with IV Dilaudid.  #3 hematuria-likely secondary to the left-sided renal mass and suspected renal cell carcinoma. -Hemoglobin stable and will continue to monitor.   All the records are reviewed and case discussed with Care Management/Social Workerr. Management plans discussed with the patient, family and they are in agreement.  CODE STATUS: Full  DVT Prophylaxis: Ambulatory  TOTAL TIME TAKING CARE OF THIS PATIENT: 35 minutes.   POSSIBLE D/C IN 1-2 DAYS, DEPENDING ON CLINICAL CONDITION.  Greater than 50% of time spent in coordination of care with talking to the family and also discussion with the urologist over the phone   Henreitta Leber M.D on 06/06/2015 at 3:08 PM  Between 7am to 6pm - Pager - [phone]  After 6pm go to [email or web address] - password [email or web address]  Office  [phone]  CC: Primary care physician; [name], MD

## 2015-06-06 NOTE — Progress Notes (Signed)
Initial Nutrition Assessment  INTERVENTION:   Meals and Snacks: Cater to patient preferences   NUTRITION DIAGNOSIS:   No nutrition diagnosis at this time  GOAL:   Patient will meet greater than or equal to 90% of their needs  MONITOR:    (Energy Intake, Electrolyte and renal Profile)  REASON FOR ASSESSMENT:   Diagnosis    ASSESSMENT:   Pt admitted with left flank pain and hematuria with renal mass per CAT scan per MD note. Pt for MRI 06/06/2015.  History reviewed. No pertinent past medical history.   Diet Order:  Diet regular Room service appropriate?: Yes; Fluid consistency:: Thin   Current Nutrition: Pt out of room for MRI this am on visit. Per documentation bites of tray this am.  Food/Nutrition-Related History: Per MST no decrease in appetite PTA.   Medications: Miralax, senna, NS at 137mL/hr, Sodium bicarbonate, dilaudid  Electrolyte/Renal Profile and Glucose Profile:   Recent Labs Lab 06/05/15 1838 06/06/15 0518  NA 136 137  K 3.8 3.8  CL 101 100*  CO2 26 29  BUN 16 15  CREATININE 1.75* 1.73*  CALCIUM 8.8* 8.4*  GLUCOSE 177* 113*   Protein Profile:  Recent Labs Lab 06/05/15 1838  ALBUMIN 3.9    Gastrointestinal Profile: Last BM: unknown  Weight Change: Per MST no decrease in weight PTA  Height:   Ht Readings from Last 1 Encounters:  06/05/15 6\' 1"  (1.854 m)    Weight:   Wt Readings from Last 1 Encounters:  06/06/15 210 lb 9.6 oz (95.528 kg)     BMI:  Body mass index is 27.79 kg/(m^2).   EDUCATION NEEDS:   No education needs identified at this time   [place], [place], LDN Pager [phone]

## 2015-06-07 ENCOUNTER — Encounter
Admission: EM | Disposition: A | Discharge status: Home / Self Care | Payer: Self-pay | Source: Home / Self Care | Attending: Specialist

## 2015-06-07 ENCOUNTER — Inpatient Hospital Stay: Payer: BLUE CROSS/BLUE SHIELD | Admitting: Anesthesiology

## 2015-06-07 ENCOUNTER — Inpatient Hospital Stay: Payer: BLUE CROSS/BLUE SHIELD

## 2015-06-07 ENCOUNTER — Ambulatory Visit: Payer: Self-pay | Admitting: Urology

## 2015-06-07 ENCOUNTER — Encounter: Payer: Self-pay | Admitting: Anesthesiology

## 2015-06-07 DIAGNOSIS — N2889 Other specified disorders of kidney and ureter: Secondary | ICD-10-CM

## 2015-06-07 DIAGNOSIS — R109 Unspecified abdominal pain: Secondary | ICD-10-CM

## 2015-06-07 DIAGNOSIS — N138 Other obstructive and reflux uropathy: Secondary | ICD-10-CM

## 2015-06-07 DIAGNOSIS — N23 Unspecified renal colic: Secondary | ICD-10-CM

## 2015-06-07 HISTORY — PX: CYSTOSCOPY WITH STENT PLACEMENT: SHX5790

## 2015-06-07 LAB — HEPATIC FUNCTION PANEL
ALK PHOS: 69 U/L (ref 38–126)
ALT: 17 U/L (ref 17–63)
AST: 21 U/L (ref 15–41)
Albumin: 3.3 g/dL — ABNORMAL LOW (ref 3.5–5.0)
BILIRUBIN DIRECT: 0.2 mg/dL (ref 0.1–0.5)
BILIRUBIN INDIRECT: 0.5 mg/dL (ref 0.3–0.9)
Total Bilirubin: 0.7 mg/dL (ref 0.3–1.2)
Total Protein: 6.5 g/dL (ref 6.5–8.1)

## 2015-06-07 LAB — HEMOGLOBIN: HEMOGLOBIN: 12.2 g/dL — AB (ref 13.0–18.0)

## 2015-06-07 SURGERY — CYSTOSCOPY, WITH STENT INSERTION
Anesthesia: General | Laterality: Left

## 2015-06-07 MED ORDER — HYDROCODONE-ACETAMINOPHEN 7.5-325 MG PO TABS: 1.0000 | ORAL_TABLET | ORAL | Status: DC | PRN

## 2015-06-07 MED ORDER — HYDROCORTISONE ACE-PRAMOXINE 1-1 % RE FOAM: 1.0000 | Freq: Two times a day (BID) | RECTAL | Status: DC

## 2015-06-07 MED ORDER — LIDOCAINE HCL 2 % EX GEL
CUTANEOUS | Status: DC | PRN
  Administered 2015-06-07: 1 via URETHRAL

## 2015-06-07 MED ORDER — HYDROCORTISONE ACE-PRAMOXINE 1-1 % RE FOAM
1.0000 | Freq: Two times a day (BID) | RECTAL | Status: DC
  Administered 2015-06-07: 1 via RECTAL
  Filled 2015-06-07: qty 10

## 2015-06-07 MED ORDER — MEPERIDINE HCL 25 MG/ML IJ SOLN
6.2500 mg | INTRAMUSCULAR | Status: DC | PRN
  Administered 2015-06-07: 12.5 mg via INTRAVENOUS

## 2015-06-07 MED ORDER — SUCCINYLCHOLINE CHLORIDE 20 MG/ML IJ SOLN
INTRAMUSCULAR | Status: DC | PRN
  Administered 2015-06-07: 100 mg via INTRAVENOUS

## 2015-06-07 MED ORDER — HYDROMORPHONE HCL 1 MG/ML IJ SOLN
1.0000 mg | INTRAMUSCULAR | Status: DC | PRN
  Administered 2015-06-07 (×2): 1 mg via INTRAVENOUS
  Filled 2015-06-07 (×2): qty 1

## 2015-06-07 MED ORDER — OXYCODONE HCL 5 MG PO TABS: 5.0000 mg | ORAL_TABLET | Freq: Once | ORAL | Status: DC | PRN

## 2015-06-07 MED ORDER — CEFAZOLIN SODIUM-DEXTROSE 2-3 GM-% IV SOLR
INTRAVENOUS | Status: DC | PRN
  Administered 2015-06-07: 2 g via INTRAVENOUS

## 2015-06-07 MED ORDER — HYDROCORTISONE ACETATE 25 MG RE SUPP
25.0000 mg | Freq: Two times a day (BID) | RECTAL | Status: DC
  Administered 2015-06-07: 25 mg via RECTAL
  Filled 2015-06-07: qty 1

## 2015-06-07 MED ORDER — HYDROCORTISONE ACETATE 25 MG RE SUPP: 25.0000 mg | Freq: Two times a day (BID) | RECTAL | Status: DC

## 2015-06-07 MED ORDER — SODIUM CHLORIDE 0.9 % IR SOLN
Status: DC | PRN
  Administered 2015-06-07: 2400 mL

## 2015-06-07 MED ORDER — LIDOCAINE HCL 2 % EX GEL
CUTANEOUS | Status: AC
  Filled 2015-06-07: qty 10

## 2015-06-07 MED ORDER — MEPERIDINE HCL 25 MG/ML IJ SOLN
INTRAMUSCULAR | Status: AC
  Filled 2015-06-07: qty 1

## 2015-06-07 MED ORDER — LIDOCAINE HCL (CARDIAC) 20 MG/ML IV SOLN
INTRAVENOUS | Status: DC | PRN
  Administered 2015-06-07: 50 mg via INTRAVENOUS

## 2015-06-07 MED ORDER — ACETAMINOPHEN 10 MG/ML IV SOLN
1000.0000 mg | Freq: Four times a day (QID) | INTRAVENOUS | Status: DC
  Administered 2015-06-07 (×2): 1000 mg via INTRAVENOUS
  Filled 2015-06-07 (×4): qty 100

## 2015-06-07 MED ORDER — OXYCODONE HCL 5 MG/5ML PO SOLN: 5.0000 mg | Freq: Once | ORAL | Status: DC | PRN

## 2015-06-07 MED ORDER — IOTHALAMATE MEGLUMINE 43 % IV SOLN
INTRAVENOUS | Status: DC | PRN
  Administered 2015-06-07: 30 mL via URETHRAL

## 2015-06-07 MED ORDER — FUROSEMIDE 10 MG/ML IJ SOLN
INTRAMUSCULAR | Status: DC | PRN
  Administered 2015-06-07: 20 mg via INTRAMUSCULAR

## 2015-06-07 MED ORDER — FENTANYL CITRATE (PF) 100 MCG/2ML IJ SOLN
25.0000 ug | INTRAMUSCULAR | Status: DC | PRN
  Administered 2015-06-07: 50 ug via INTRAVENOUS

## 2015-06-07 MED ORDER — PROPOFOL 10 MG/ML IV BOLUS
INTRAVENOUS | Status: DC | PRN
  Administered 2015-06-07: 180 mg via INTRAVENOUS

## 2015-06-07 MED ORDER — PRAMOXINE HCL 1 % RE FOAM
Freq: Three times a day (TID) | RECTAL | Status: DC | PRN
  Filled 2015-06-07: qty 15

## 2015-06-07 MED ORDER — MIDAZOLAM HCL 2 MG/2ML IJ SOLN
INTRAMUSCULAR | Status: DC | PRN
  Administered 2015-06-07: 1 mg via INTRAVENOUS

## 2015-06-07 MED ORDER — TRAMADOL HCL 50 MG PO TABS: 50.0000 mg | ORAL_TABLET | Freq: Four times a day (QID) | ORAL | Status: DC | PRN

## 2015-06-07 MED ORDER — MAGNESIUM CITRATE PO SOLN
0.5000 | Freq: Once | ORAL | Status: AC
  Administered 2015-06-07: 15:00:00 0.5 via ORAL
  Filled 2015-06-07: qty 296

## 2015-06-07 SURGICAL SUPPLY — 16 items
BAG DRAIN CYSTO-URO LG1000N (MISCELLANEOUS) ×2 IMPLANT
CATH URETL 5X70 OPEN END (CATHETERS) ×2 IMPLANT
GLOVE BIO SURGEON STRL SZ7.5 (GLOVE) ×4 IMPLANT
GOWN STRL REUS W/ TWL LRG LVL3 (GOWN DISPOSABLE) ×2 IMPLANT
GOWN STRL REUS W/TWL LRG LVL3 (GOWN DISPOSABLE) ×2
PACK CYSTO AR (MISCELLANEOUS) ×2 IMPLANT
PREP PVP WINGED SPONGE (MISCELLANEOUS) IMPLANT
SENSORWIRE 0.038 NOT ANGLED (WIRE) ×2
SET CYSTO W/LG BORE CLAMP LF (SET/KITS/TRAYS/PACK) ×2 IMPLANT
SOL .9 NS 3000ML IRR  AL (IV SOLUTION) ×1
SOL .9 NS 3000ML IRR UROMATIC (IV SOLUTION) ×1 IMPLANT
STENT IMPLANT
STENT URET 6FRX28 CONTOUR (STENTS) ×2 IMPLANT
SURGILUBE 2OZ TUBE FLIPTOP (MISCELLANEOUS) ×2 IMPLANT
WATER STERILE IRR 1000ML POUR (IV SOLUTION) ×2 IMPLANT
WIRE SENSOR 0.038 NOT ANGLED (WIRE) ×1 IMPLANT

## 2015-06-07 NOTE — Anesthesia Procedure Notes (Signed)
Procedure Name: Intubation Date/Time: 06/07/2015 8:23 PM Performed by: Lendon Colonel Pre-anesthesia Checklist: Patient identified, Emergency Drugs available, Suction available, Patient being monitored and Timeout performed Patient Re-evaluated:Patient Re-evaluated prior to inductionOxygen Delivery Method: Circle system utilized Preoxygenation: Pre-oxygenation with 100% oxygen Intubation Type: IV induction and Cricoid Pressure applied Ventilation: Mask ventilation without difficulty Laryngoscope Size: Mac and 4 Grade View: Grade III Tube type: Oral Number of attempts: 2 Airway Equipment and Method: Stylet Placement Confirmation: ETT inserted through vocal cords under direct vision,  positive ETCO2 and breath sounds checked- equal and bilateral Secured at: 22 cm Tube secured with: Tape Dental Injury: Teeth and Oropharynx as per pre-operative assessment  Difficulty Due To: Difficult Airway- due to anterior larynx

## 2015-06-07 NOTE — Discharge Instructions (Signed)
DISCHARGE INSTRUCTIONS FOR KIDNEY STONE/URETERAL STENT   MEDICATIONS:  1.  Resume all your other meds from home - except do not take any extra narcotic pain meds that you may have at home.   ACTIVITY:  1. No strenuous activity x 1week  2. No driving while on narcotic pain medications  3. Drink plenty of water  4. Continue to walk at home - you can still get blood clots when you are at home, so keep active, but don't over do it.  5. May return to work/school tomorrow or when you feel ready   BATHING:  1. You can shower and we recommend daily showers    SIGNS/SYMPTOMS TO CALL:  Please call us if you have a fever greater than 101.5, uncontrolled nausea/vomiting, uncontrolled pain, dizziness, unable to urinate, bloody urine, chest pain, shortness of breath, leg swelling, leg pain, redness around wound, drainage from wound, or any other concerns or questions.   You can reach [place] at [phone].   FOLLOW-UP:  1. You will be contacted with a Surgery time/date in the near future.AMBULATORY SURGERY  DISCHARGE INSTRUCTIONS   1) The drugs that you were given will stay in your system until tomorrow so for the next 24 hours you should not:  A) Drive an automobile B) Make any legal decisions C) Drink any alcoholic beverage   2) You may resume regular meals tomorrow.  Today it is better to start with liquids and gradually work up to solid foods.  You may eat anything you prefer, but it is better to start with liquids, then soup and crackers, and gradually work up to solid foods.   3) Please notify your doctor immediately if you have any unusual bleeding, trouble breathing, redness and pain at the surgery site, drainage, fever, or pain not relieved by medication.    4) Additional Instructions:        Please contact your physician with any problems or Same Day Surgery at [phone], Monday through Friday 6 am to 4 pm, or [place] at [facility] number at [phone].

## 2015-06-07 NOTE — Progress Notes (Signed)
Mr. Vitolo has had extreme pain throughout the night needing pain medication every time it is available with temporary relief until next available dose. After voiding, pt has uncontrolled shaking/tremors for about 10-26min. Will continue to assess, monitor, & manage pain.

## 2015-06-07 NOTE — Plan of Care (Signed)
Problem: Discharge Progression Outcomes Goal: Other Discharge Outcomes/Goals Outcome: Progressing Plan of care progress to goal for: 1. Pain-pt c/o pain in left flank, prn meds given with improvement 2. Hemodynamically-             -VSS, pt remains afebrile this shift             -IV fluids continue per orders 3. Complications-no evidence of this shift 4. Diet-pt tolerating diet this shift 5. Activity-pt up in room unassisted with wife at bedside

## 2015-06-07 NOTE — Progress Notes (Signed)
Pt discharged instructions reviewed, prescriptions given, questions answered to pt and wife at bedside. No c/o pain.

## 2015-06-07 NOTE — Consult Note (Signed)
Patient with 5cm central renal mass, concerning for renal cell carcinoma.  As part of the work-up I have added a chest xray and LFTs.

## 2015-06-07 NOTE — Progress Notes (Signed)
[facility] [place] NAME: Joseph May    MR#:  [id number]  DATE OF BIRTH:  Jun 27, 1961  SUBJECTIVE:  CHIEF COMPLAINT:   Chief Complaint  Patient presents with  . Flank Pain   Patient still having significant left-sided flank pain.  Hematuria improved. Hemoglobin stable. Also having bleeding from his hemorrhoids which is causing some irritation.  REVIEW OF SYSTEMS:    Review of Systems  Constitutional: Negative for fever and chills.  HENT: Negative for congestion and tinnitus.   Eyes: Negative for blurred vision and double vision.  Respiratory: Negative for cough, shortness of breath and wheezing.   Cardiovascular: Negative for chest pain, orthopnea and PND.  Gastrointestinal: Negative for nausea, vomiting, abdominal pain and diarrhea.  Genitourinary: Positive for hematuria (improved) and flank pain (left-sided). Negative for dysuria.  Neurological: Negative for dizziness, sensory change and focal weakness.  All other systems reviewed and are negative.   Nutrition: Regular Tolerating Diet: Yes Tolerating PT: Ambulatory     DRUG ALLERGIES:  No Known Allergies  VITALS:  Blood pressure 147/91, pulse 110, temperature 98.5 F (36.9 C), temperature source Oral, resp. rate 18, height 6\' 1"  (1.854 m), weight 95.528 kg (210 lb 9.6 oz), SpO2 97 %.  PHYSICAL EXAMINATION:   Physical Exam  GENERAL:  54 y.o.-year-old patient lying in the bed with no acute distress.  EYES: Pupils equal, round, reactive to light and accommodation. No scleral icterus. Extraocular muscles intact.  HEENT: Head atraumatic, normocephalic. Oropharynx and nasopharynx clear.  NECK:  Supple, no jugular venous distention. No thyroid enlargement, no tenderness.  LUNGS: Normal breath sounds bilaterally, no wheezing, rales, rhonchi. No use of accessory muscles of respiration.  CARDIOVASCULAR: S1, S2 normal. No murmurs, rubs, or gallops.  ABDOMEN: Soft, slightly  distended, left-sided flank pain. Bowel sounds present. No organomegaly or mass.  EXTREMITIES: No cyanosis, clubbing or edema b/l.    NEUROLOGIC: Cranial nerves II through XII are intact. No focal Motor or sensory deficits b/l.   PSYCHIATRIC: The patient is alert and oriented x 3. Good affect SKIN: No obvious rash, lesion, or ulcer.    LABORATORY PANEL:   CBC  Recent Labs Lab 06/06/15 0518 06/07/15 0413  WBC 8.3  --   HGB 12.3* 12.2*  HCT 36.4*  --   PLT 172  --    ------------------------------------------------------------------------------------------------------------------  Chemistries   Recent Labs Lab 06/06/15 0518 06/07/15 0413  NA 137  --   K 3.8  --   CL 100*  --   CO2 29  --   GLUCOSE 113*  --   BUN 15  --   CREATININE 1.73*  --   CALCIUM 8.4*  --   AST  --  21  ALT  --  17  ALKPHOS  --  69  BILITOT  --  0.7   ------------------------------------------------------------------------------------------------------------------  Cardiac Enzymes No results for input(s): TROPONINI in the last 168 hours. ------------------------------------------------------------------------------------------------------------------  RADIOLOGY:  Dg Chest 1 View  06/07/2015   CLINICAL DATA:  Renal mass concerning for renal cell carcinoma  EXAM: CHEST  1 VIEW  COMPARISON:  None.  FINDINGS: Mild cardiomegaly. Low lung volumes. No confluent airspace opacities, effusions or edema. No acute bony abnormality.  IMPRESSION: Low lung volumes, mild cardiomegaly.  No acute findings.   Electronically Signed   By: Rolm Baptise M.D.   On: 06/07/2015 09:25   Mr Pelvis W Wo Contrast  06/06/2015   CLINICAL DATA:  Left-sided flank pain  for 3 days. Left renal mass on CT. Hematuria.  EXAM: MRI ABDOMEN WITHOUT AND WITH CONTRAST  TECHNIQUE: Multiplanar multisequence MR imaging of the abdomen was performed both before and after the administration of intravenous contrast.  CONTRAST:  45mL MULTIHANCE  GADOBENATE DIMEGLUMINE 529 MG/ML IV SOLN  COMPARISON:  Outside CT of 06/03/2015.  FINDINGS: Portions of exam are mildly motion degraded.  Lower chest: Normal heart size without pericardial or pleural effusion. Left base atelectasis.  Hepatobiliary: Tiny left hepatic lobe cyst. Normal gallbladder, without biliary ductal dilatation.  Pancreas: Normal, without mass or ductal dilatation.  Spleen: Normal  Adrenals/Urinary Tract: Normal adrenal glands. Normal right kidney, without hydronephrosis. An anterior interpolar left renal mass measures in 5.2 x 3.8 cm on image 68 of series 14. 4.1 cm craniocaudal on image 29 of series 18. Mildly T2 hypo intense. Demonstrates heterogeneous post-contrast enhancement.  There is hemorrhage in the left renal collecting system and proximal left ureter. New or increased mild left-sided hydroureteronephrosis. Example image 8 of series 2. No obstructive mass identified. Normal urinary bladder.  Stomach/Bowel: Normal stomach, without wall thickening. Normal large and small bowel loops.  Vascular/Lymphatic: Normal caliber of the aorta and branch vessels. Single renal arteries bilaterally. Duplicated IVC. There are likely 2 accessory left lower pole renal veins, entering the left-sided IVC. Example images 68 and 76 of series 14. No renal vein thrombus. No retroperitoneal or retrocrural adenopathy.  Reproductive: Normal prostate.  Other: Development of small volume free pelvic fluid, including on image 17 of series 6.  Musculoskeletal: No acute osseous abnormality.  IMPRESSION: 1. Enhancing left renal mass, consistent with renal cell carcinoma. 2. No renal vein involvement or evidence of metastatic disease within the abdomen/pelvis. 3. New or increased mild left-sided hydroureteronephrosis, likely secondary to hemorrhage within the left renal collecting system. 4. Mildly motion degraded exam. 5. New small volume pelvic fluid. 6. 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   Electronically Signed   By: Abigail Miyamoto M.D.   On: 06/06/2015 10:35   Mr Abdomen W Wo Contrast  06/06/2015   CLINICAL DATA:  Left-sided flank pain for 3 days. Left renal mass on CT. Hematuria.  EXAM: MRI ABDOMEN WITHOUT AND WITH CONTRAST  TECHNIQUE: Multiplanar multisequence MR imaging of the abdomen was performed both before and after the administration of intravenous contrast.  CONTRAST:  25mL MULTIHANCE GADOBENATE DIMEGLUMINE 529 MG/ML IV SOLN  COMPARISON:  Outside CT of 06/03/2015.  FINDINGS: Portions of exam are mildly motion degraded.  Lower chest: Normal heart size without pericardial or pleural effusion. Left base atelectasis.  Hepatobiliary: Tiny left hepatic lobe cyst. Normal gallbladder, without biliary ductal dilatation.  Pancreas: Normal, without mass or ductal dilatation.  Spleen: Normal  Adrenals/Urinary Tract: Normal adrenal glands. Normal right kidney, without hydronephrosis. An anterior interpolar left renal mass measures in 5.2 x 3.8 cm on image 68 of series 14. 4.1 cm craniocaudal on image 29 of series 18. Mildly T2 hypo intense. Demonstrates heterogeneous post-contrast enhancement.  There is hemorrhage in the left renal collecting system and proximal left ureter. New or increased mild left-sided hydroureteronephrosis. Example image 8 of series 2. No obstructive mass identified. Normal urinary bladder.  Stomach/Bowel: Normal stomach, without wall thickening. Normal large and small bowel loops.  Vascular/Lymphatic: Normal caliber of the aorta and branch vessels. Single renal arteries bilaterally. Duplicated IVC. There are likely 2 accessory left lower pole renal veins, entering the left-sided IVC. Example images 68 and 76 of series 14. No  renal vein thrombus. No retroperitoneal or retrocrural adenopathy.  Reproductive: Normal prostate.  Other: Development of small volume free pelvic fluid, including on image 17 of series 6.  Musculoskeletal: No acute osseous abnormality.  IMPRESSION: 1.  Enhancing left renal mass, consistent with renal cell carcinoma. 2. No renal vein involvement or evidence of metastatic disease within the abdomen/pelvis. 3. New or increased mild left-sided hydroureteronephrosis, likely secondary to hemorrhage within the left renal collecting system. 4. Mildly motion degraded exam. 5. New small volume pelvic fluid. 6. 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   Electronically Signed   By: Abigail Miyamoto M.D.   On: 06/06/2015 10:35     ASSESSMENT AND PLAN:   54 year old male with no significant past medical history who presented to the hospital with left-sided flank pain and hematuria. Patient on CT scan was noted to have a possible left-sided renal mass suspicious for malignancy.  #1 left-sided renal mass-likely the cause of patient's left-sided flank pain, hematuria. -Patient had MRI of the abdomen and pelvis showing a left-sided renal mass consistent with possible renal cell carcinoma. -Discussed with urology and the plan to place a left-sided ureteral stent to improve his pain and he can likely be discharged home after. Patient likely will need a left-sided nephrectomy in the next 2 weeks post discharge.  #2 left-sided flank pain-likely secondary to renal colic due to thrombus in the renal pelvis from his hematuria from his renal mass. Patient's pain is not very well controlled with IV Dilaudid and also oral Dilaudid. -Appreciate urology input and patient likely to go to the OR later today for left-sided ureteral stent placement. Patient can likely be discharged shortly thereafter.  #3 hematuria-likely secondary to the left-sided renal mass and suspected renal cell carcinoma. -Hemoglobin stable and will continue to monitor.  #4 hemorrhoids-patient has been constipated for a few days due to pain meds. He has not developed some mild bleeding from his external hemorrhoids. I have ordered some hydrocortisone suppository along with some  Proctofoam.   he likely can be discharged after ureteral stent placement later today.  All the records are reviewed and case discussed with Care Management/Social Workerr. Management plans discussed with the patient, family and they are in agreement.  CODE STATUS: Full  DVT Prophylaxis: Ambulatory  TOTAL TIME TAKING CARE OF THIS PATIENT: 30 minutes.   POSSIBLE D/C later today   Henreitta Leber M.D on 06/07/2015 at 2:47 PM  Between 7am to 6pm - Pager - [phone]  After 6pm go to [email or web address] - password [email or web address] [facility]  Office  [phone]  CC: Primary care physician; [name], MD

## 2015-06-07 NOTE — Transfer of Care (Signed)
Immediate Anesthesia Transfer of Care Note  Patient: Joseph May  Procedure(s) Performed: Procedure(s): CYSTOSCOPY WITH STENT PLACEMENT (Left)  Patient Location: PACU  Anesthesia Type:General  Level of Consciousness: awake, alert , oriented and patient cooperative  Airway & Oxygen Therapy: Patient Spontanous Breathing and Patient connected to nasal cannula oxygen  Post-op Assessment: Report given to RN and Post -op Vital signs reviewed and stable  Post vital signs: Reviewed and stable  Last Vitals:  Filed Vitals:   06/07/15 1306  BP: 147/91  Pulse: 110  Temp: 36.9 C  Resp: 18    Complications: No apparent anesthesia complications

## 2015-06-07 NOTE — Plan of Care (Signed)
Problem: Discharge Progression Outcomes Goal: Other Discharge Outcomes/Goals Outcome: Progressing Plan of care progress to goals: 1. Pain-pt c/o pain in left flank temporarily relieved by PRN Dilaudid 2. Hemodynamically             -VSS, afebrile             -IV fluids infusing as ordered 3. Tolerating regular diet w/o complication 5. Moderate fall risk. +1 assist out of bed for safety due to frequent pain medication. Pt understands how to use call system for assistance.

## 2015-06-07 NOTE — Anesthesia Preprocedure Evaluation (Signed)
Anesthesia Evaluation  Patient identified by MRN, date of birth, ID band Patient awake    Reviewed: Allergy & Precautions, H&P , NPO status , Patient's Chart, lab work & pertinent test results  History of Anesthesia Complications Negative for: history of anesthetic complications  Airway Mallampati: II  TM Distance: >3 FB Neck ROM: full    Dental  (+) Poor Dentition   Pulmonary neg pulmonary ROS,    Pulmonary exam normal breath sounds clear to auscultation       Cardiovascular negative cardio ROS Normal cardiovascular exam Rhythm:regular Rate:Normal     Neuro/Psych negative neurological ROS  negative psych ROS   GI/Hepatic negative GI ROS, Neg liver ROS,   Endo/Other  negative endocrine ROS  Renal/GU negative Renal ROS  negative genitourinary   Musculoskeletal   Abdominal   Peds  Hematology negative hematology ROS (+)   Anesthesia Other Findings left-sided renal mass  Reproductive/Obstetrics negative OB ROS                             Anesthesia Physical Anesthesia Plan  ASA: II  Anesthesia Plan: General ETT and Rapid Sequence   Post-op Pain Management:    Induction:   Airway Management Planned:   Additional Equipment:   Intra-op Plan:   Post-operative Plan:   Informed Consent: I have reviewed the patients History and Physical, chart, labs and discussed the procedure including the risks, benefits and alternatives for the proposed anesthesia with the patient or authorized representative who has indicated his/her understanding and acceptance.   Dental Advisory Given  Plan Discussed with: Anesthesiologist, CRNA and Surgeon  Anesthesia Plan Comments:         Anesthesia Quick Evaluation

## 2015-06-07 NOTE — Progress Notes (Signed)
63M with left renal mass and associated clot colic.  Intv: Tough night with pain control. No hematuria.  No fevers.  No BM  O: Filed Vitals:   06/07/15 1306  BP: 147/91  Pulse: 110  Temp: 98.5 F (36.9 C)  Resp: 18    Intake/Output Summary (Last 24 hours) at 06/07/15 1406 Last data filed at 06/07/15 0900  Gross per 24 hour  Intake   1940 ml  Output   3100 ml  Net  -1160 ml   Uncomfortable appearing Abdomen is distended Left CVA tenderness Extremities symmetric   Recent Labs  06/05/15 1838 06/06/15 0518 06/07/15 0413  WBC 10.8* 8.3  --   HGB 13.7 12.3* 12.2*  HCT 39.5* 36.4*  --     Recent Labs  06/05/15 1838 06/06/15 0518  NA 136 137  K 3.8 3.8  CL 101 100*  CO2 26 29  GLUCOSE 177* 113*  BUN 16 15  CREATININE 1.75* 1.73*  CALCIUM 8.8* 8.4*   No results for input(s): LABPT, INR in the last 72 hours. No results for input(s): PSA in the last 72 hours. No results for input(s): LABURIN in the last 72 hours. No results found for this or any previous visit.   MRI - I have independently reviewed his MRI demonstrating a 5cm central mass in the left intrapolar region concerning for RCC.  Left proximal hydro, presumably from clot. CXR - no evidence of metastatic disease  Imp: Left renal mass which bleed several days ago, he has had persistent clot colic without signficant relieve since admission. Plan: To OR for ureteral stent placement.  Should be able to be discharged from there.  F/u for lap nephrectomy in 2 weeks.

## 2015-06-07 NOTE — Progress Notes (Signed)
PAtient discharged from PACU.  Scripts written.  Instructions provided.  Has f/u pending.

## 2015-06-07 NOTE — Op Note (Signed)
Preoperative diagnosis:  1. Left ureteral obstruction/clot colic   Postoperative diagnosis:  1. same   Procedure:  1. Cystoscopy 2. left ureteral stent placement 3. left retrograde pyelography with interpretation   Surgeon: Ardis Hughs, MD  Anesthesia: General  Complications: None  Intraoperative findings:  Significant amt of old brown blood drained from collecting system once left ureteral orifice was cannulated.  Retrograde pyelogram demonstrated a filling defect in the distal ureter consistent with more clots as well as significant hydro with forniceal rupture at the UPJ.  No appreciable filling defects in renal pelvis.  28cm x 38F stent placed.  EBL: Minimal  Specimens: None  Indication: Joseph May is a 54 y.o. patient with left renal mass with associated bleeding.  Was in the hospital for two days with intense renal colic pain prior to opting for OR and stent placement.   After reviewing the management options for treatment, he elected to proceed with the above surgical procedure(s). We have discussed the potential benefits and risks of the procedure, side effects of the proposed treatment, the likelihood of the patient achieving the goals of the procedure, and any potential problems that might occur during the procedure or recuperation. Informed consent has been obtained.  Description of procedure:  The patient was taken to the operating room and general anesthesia was induced.  The patient was placed in the dorsal lithotomy position, prepped and draped in the usual sterile fashion, and preoperative antibiotics were administered. A preoperative time-out was performed.   Cystourethroscopy was performed.  The patient's urethra was examined and was normal demonstrating bilobar prostatic hypertrophy. The bladder was then systematically examined in its entirety. There was no evidence for any bladder tumors, stones, or other mucosal pathology.    Attention then turned  to the leftureteral orifice and a ureteral catheter was used to intubate the ureteral orifice.  Omnipaque contrast was injected through the ureteral catheter and a retrograde pyelogram was performed with findings as dictated above.  A 0.38 sensor guidewire was then advanced up the left ureter into the renal pelvis under fluoroscopic guidance.  The wire was then backloaded through the cystoscope and a ureteral stent was advance over the wire using Seldinger technique.  The stent was positioned appropriately under fluoroscopic and cystoscopic guidance.  The wire was then removed with an adequate stent curl noted in the renal pelvis as well as in the bladder.  The bladder was then emptied and the procedure ended.  The patient appeared to tolerate the procedure well and without complications.  The patient was able to be awakened and transferred to the recovery unit in satisfactory condition.    Ardis Hughs, M.D.

## 2015-06-08 ENCOUNTER — Encounter: Payer: Self-pay | Admitting: Urology

## 2015-06-08 ENCOUNTER — Other Ambulatory Visit: Payer: Self-pay | Admitting: Urology

## 2015-06-08 NOTE — Discharge Summary (Signed)
[facility] [place] NAME: Joseph May    MR#:  [id number]  DATE OF BIRTH:  [birth year]  DATE OF ADMISSION:  06/05/2015 ADMITTING PHYSICIAN: Demetrios Loll, MD  DATE OF DISCHARGE: 06/07/2015 11:22 PM  PRIMARY CARE PHYSICIAN: [name], MD    ADMISSION DIAGNOSIS:  Left flank pain [R10.9] Left renal mass [N28.89]  DISCHARGE DIAGNOSIS:  Principal Problem:   Left kidney mass Active Problems:   Hematuria   SECONDARY DIAGNOSIS:  History reviewed. No pertinent past medical history.  HOSPITAL COURSE:   54 year old male with no significant past medical history who presented to the hospital with left-sided flank pain and hematuria. Patient on CT scan was noted to have a possible left-sided renal mass suspicious for malignancy.  #1 left-sided renal mass-likely the cause of patient's left-sided flank pain, hematuria. -Patient had MRI of the abdomen and pelvis showing a left-sided renal mass consistent with possible renal cell carcinoma. -Patient was seen by urology and underwent left-sided ureteral stent placement on 06/07/2015 due to clot into the renal pelvis from the renal mass. Patient likely will need a nephrectomy in the near future and will have follow-up with urology as an outpatient.  #2 left-sided flank pain-likely secondary to renal colic due to thrombus in the renal pelvis from his hematuria from his renal mass. Patient was given IV Dilaudid and oral Dilaudid for pain control with minimal improvement. Since he was not improving with conventional treatment urology performed a left-sided ureteral stent which alleviated his symptoms and he was discharged home with close follow-up with them as an outpatient.  #3 hematuria-likely secondary to the left-sided renal mass and suspected renal cell carcinoma. -his Hemoglobin remained stable while in the hospital.   #4 hemorrhoids-patient had been constipated for a few days due to pain  meds. He developed some mild bleeding from his external hemorrhoids. He was discharged on hydrocortisone suppository and proctofoam as needed.   DISCHARGE CONDITIONS:   Stable  CONSULTS OBTAINED:  Treatment Team:  Ardis Hughs, MD  DRUG ALLERGIES:  No Known Allergies  DISCHARGE MEDICATIONS:   Discharge Medication List as of 06/07/2015  9:24 PM    START taking these medications   Details  hydrocortisone (ANUSOL-HC) 25 MG suppository Place 1 suppository (25 mg total) rectally 2 (two) times daily., Starting 06/07/2015, Until Discontinued, Print    hydrocortisone-pramoxine (PROCTOFOAM-HC) rectal foam Place 1 applicator rectally 2 (two) times daily., Starting 06/07/2015, Until Discontinued, Print      CONTINUE these medications which have CHANGED   Details  HYDROcodone-acetaminophen (NORCO) 7.5-325 MG per tablet Take 1-2 tablets by mouth every 4 (four) hours as needed for moderate pain or severe pain., Starting 06/07/2015, Until Discontinued, Print      CONTINUE these medications which have NOT CHANGED   Details  acetaminophen (TYLENOL) 500 MG tablet Take 1,000 mg by mouth every 6 (six) hours as needed for mild pain or moderate pain., Until Discontinued, Historical Med         DISCHARGE INSTRUCTIONS:   DIET:  Regular diet  DISCHARGE CONDITION:  Stable  ACTIVITY:  Activity as tolerated  OXYGEN:  Home Oxygen: No.   Oxygen Delivery: room air  DISCHARGE LOCATION:  home   If you experience worsening of your admission symptoms, develop shortness of breath, life threatening emergency, suicidal or homicidal thoughts you must seek medical attention immediately by calling 911 or calling your MD immediately  if symptoms less severe.  You Must read complete instructions/literature along  with all the possible adverse reactions/side effects for all the Medicines you take and that have been prescribed to you. Take any new Medicines after you have completely understood and  accpet all the possible adverse reactions/side effects.   Please note  You were cared for by a hospitalist during your hospital stay. If you have any questions about your discharge medications or the care you received while you were in the hospital after you are discharged, you can call the unit and asked to speak with the hospitalist on call if the hospitalist that took care of you is not available. Once you are discharged, your primary care physician will handle any further medical issues. Please note that NO REFILLS for any discharge medications will be authorized once you are discharged, as it is imperative that you return to your primary care physician (or establish a relationship with a primary care physician if you do not have one) for your aftercare needs so that they can reassess your need for medications and monitor your lab values.    DATA REVIEW:   CBC  Recent Labs Lab 06/06/15 0518 06/07/15 0413  WBC 8.3  --   HGB 12.3* 12.2*  HCT 36.4*  --   PLT 172  --     Chemistries   Recent Labs Lab 06/06/15 0518 06/07/15 0413  NA 137  --   K 3.8  --   CL 100*  --   CO2 29  --   GLUCOSE 113*  --   BUN 15  --   CREATININE 1.73*  --   CALCIUM 8.4*  --   AST  --  21  ALT  --  17  ALKPHOS  --  69  BILITOT  --  0.7    Cardiac Enzymes No results for input(s): TROPONINI in the last 168 hours.  Microbiology Results  No results found for this or any previous visit.  RADIOLOGY:  Dg Chest 1 View  06/07/2015   CLINICAL DATA:  Renal mass concerning for renal cell carcinoma  EXAM: CHEST  1 VIEW  COMPARISON:  None.  FINDINGS: Mild cardiomegaly. Low lung volumes. No confluent airspace opacities, effusions or edema. No acute bony abnormality.  IMPRESSION: Low lung volumes, mild cardiomegaly.  No acute findings.   Electronically Signed   By: Rolm Baptise M.D.   On: 06/07/2015 09:25      Management plans discussed with the patient, family and they are in agreement.  CODE  STATUS:   TOTAL TIME TAKING CARE OF THIS PATIENT: 40 minutes.    Henreitta Leber M.D on 06/08/2015 at 1:55 PM  Between 7am to 6pm - Pager - [phone]  After 6pm go to [email or web address] - password [email or web address] [facility]  Office  [phone]  CC: Primary care physician; [name], MD

## 2015-06-08 NOTE — Anesthesia Postprocedure Evaluation (Signed)
  Anesthesia Post-op Note  Patient: Joseph May  Procedure(s) Performed: Procedure(s): CYSTOSCOPY WITH STENT PLACEMENT (Left)  Anesthesia type:General ETT, Rapid Sequence  Patient location: PACU  Post pain: Pain level controlled  Post assessment: Post-op Vital signs reviewed, Patient's Cardiovascular Status Stable, Respiratory Function Stable, Patent Airway and No signs of Nausea or vomiting  Post vital signs: Reviewed and stable  Last Vitals:  Filed Vitals:   06/07/15 2214  BP: 158/89  Pulse: 100  Temp: 36.9 C  Resp: 18    Level of consciousness: awake, alert  and patient cooperative  Complications: No apparent anesthesia complications

## 2015-06-09 ENCOUNTER — Ambulatory Visit: Payer: BLUE CROSS/BLUE SHIELD | Admitting: Primary Care

## 2015-06-15 ENCOUNTER — Ambulatory Visit (INDEPENDENT_AMBULATORY_CARE_PROVIDER_SITE_OTHER): Payer: BLUE CROSS/BLUE SHIELD | Admitting: Primary Care

## 2015-06-15 ENCOUNTER — Encounter: Payer: Self-pay | Admitting: Primary Care

## 2015-06-15 VITALS — BP 142/82 | HR 81 | Temp 97.9°F | Ht 73.0 in | Wt 196.1 lb

## 2015-06-15 DIAGNOSIS — N2889 Other specified disorders of kidney and ureter: Secondary | ICD-10-CM | POA: Diagnosis not present

## 2015-06-15 NOTE — Assessment & Plan Note (Signed)
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 Left renal stent in place currently. Will undergo complete removal of left kidney on October 6th.

## 2015-06-15 NOTE — Progress Notes (Signed)
Subjective:    Patient ID: Joseph May, male    DOB: 02/19/1961, 54 y.o.   MRN: [id number]  HPI  Joseph May is a 54 year old male who presents today to establish care and discuss the problems mentioned below. Will review records. His last physical was 10+ years ago. He has no complaints today.  1) Renal mass: Discovered in early September. He was in [place] on vacation in early September when he noted a sudden onset of left sided flank pain and LUQ abdominal pain with urination of blood. He was admitted to the local hospital and was noted to have a mass to his left kidney. He was evaluated by a urologist in the ED in [place] and underwent renal MRI. It was determined to be a renal tumor and is undergoing complete removal of left kidney on October 6th. He is currently managed on norco and tramadol PRN, typically at bedtime.  2) Elevated blood pressure: Noted to be slightly elevated today in clinic at 142/82. He has no history of elevated readings. Denies headaches, chest pain SOB. He's currently in pain due to recent placement of renal stent.  Review of Systems  Constitutional: Negative for unexpected weight change.  HENT: Negative for rhinorrhea.   Respiratory: Negative for cough and shortness of breath.   Cardiovascular: Negative for chest pain.  Gastrointestinal: Negative for diarrhea and constipation.       Some constipation with pain medications. Denies concerns at this time.  Genitourinary: Positive for flank pain and difficulty urinating.       Has renal stent in place.  Musculoskeletal: Negative for myalgias and arthralgias.  Skin: Negative for rash.  Neurological: Negative for dizziness, numbness and headaches.  Psychiatric/Behavioral:       Denies concerns for anxiety or depression       History reviewed. No pertinent past medical history.  Social History   Social History  . Marital Status: Married    Spouse Name: N/A  . Number of Children: N/A  . Years of  Education: N/A   Occupational History  . Not on file.   Social History Main Topics  . Smoking status: Never Smoker   . Smokeless tobacco: Not on file  . Alcohol Use: Yes  . Drug Use: No  . Sexual Activity: Not on file   Other Topics Concern  . Not on file   Social History Narrative   Married.   Works as a Dance movement psychotherapist.   1 daughter.   Enjoys visiting family at the beach.        Past Surgical History  Procedure Laterality Date  . Neck surgery    . Cystoscopy with stent placement Left 06/07/2015    Procedure: CYSTOSCOPY WITH STENT PLACEMENT;  Surgeon: Ardis Hughs, MD;  Location: [facility] ORS;  Service: Urology;  Laterality: Left;    Family History  Problem Relation Age of Onset  . Diabetes Mother   . Breast cancer Mother     Allergies  Allergen Reactions  . Dilaudid [Hydromorphone Hcl] Rash    Current Outpatient Prescriptions on File Prior to Visit  Medication Sig Dispense Refill  . acetaminophen (TYLENOL) 500 MG tablet Take 1,000 mg by mouth every 6 (six) hours as needed for mild pain or moderate pain.    Marland Kitchen HYDROcodone-acetaminophen (NORCO) 7.5-325 MG per tablet Take 1-2 tablets by mouth every 4 (four) hours as needed for moderate pain or severe pain. 30 tablet 0  . Multiple Vitamin (MULTIVITAMIN WITH MINERALS) TABS  tablet Take 1 tablet by mouth daily.    . traMADol (ULTRAM) 50 MG tablet Take 1-2 tablets (50-100 mg total) by mouth every 6 (six) hours as needed for moderate pain. 50 tablet 1  . hydrocortisone (ANUSOL-HC) 25 MG suppository Place 1 suppository (25 mg total) rectally 2 (two) times daily. (Patient not taking: Reported on 06/14/2015) 12 suppository 0  . hydrocortisone-pramoxine (PROCTOFOAM-HC) rectal foam Place 1 applicator rectally 2 (two) times daily. (Patient not taking: Reported on 06/14/2015) 10 g 0   No current facility-administered medications on file prior to visit.    BP 142/82 mmHg  Pulse 81  Temp(Src) 97.9 F (36.6 C) (Oral)  Ht 6'  1" (1.854 m)  Wt 196 lb 1.9 oz (88.959 kg)  BMI 25.88 kg/m2  SpO2 99%    Objective:   Physical Exam  Constitutional: He is oriented to person, place, and time. He appears well-nourished.  Cardiovascular: Normal rate and regular rhythm.   Pulmonary/Chest: Effort normal and breath sounds normal.  Neurological: He is alert and oriented to person, place, and time.  Skin: Skin is warm and dry.  Psychiatric: He has a normal mood and affect.          Assessment & Plan:

## 2015-06-15 NOTE — Patient Instructions (Signed)
Please schedule a physical with me in early 2017. You will also schedule a lab only appointment one week prior. We will discuss your lab results during your physical.  It was a pleasure to meet you today! Please don't hesitate to call me with any questions. Welcome to [facility]!

## 2015-06-15 NOTE — Progress Notes (Signed)
Pre visit review using our clinic review tool, if applicable. No additional management support is needed unless otherwise documented below in the visit note. 

## 2015-06-22 NOTE — Patient Instructions (Addendum)
Joseph May  06/22/2015   Your procedure is scheduled on:  06/30/2015    Report to [facility] Main  Entrance take [facility]  elevators to 3rd floor to  [place] at 1000 AM.  Call this number if you have problems the morning of surgery [phone]   Remember: ONLY 1 PERSON MAY GO WITH YOU TO SHORT STAY TO GET  READY MORNING OF [place].  Do not eat food or drink liquids :After Midnight.             TAKE 8-10 ounces of Magnesium Citrate by 12 noon day before surgery.               Fleets enema nite before surgery.        Take these medicines the morning of surgery with A SIP OF WATER: Hydrocodone if needed                                  You may not have any metal on your body including hair pins and              piercings  Do not wear jewelry, , lotions, powders or perfumes, deodorant                     Men may shave face and neck.   Do not bring valuables to the hospital. [place].  Contacts, dentures or bridgework may not be worn into surgery.  Leave suitcase in the car. After surgery it may be brought to your room.     Special Instructions: coughing and deep breathing exercises, leg exercises               Please read over the following fact sheets you were given: _____________________________________________________________________             [facility] - Preparing for Surgery Before surgery, you can play an important role.  Because skin is not sterile, your skin needs to be as free of germs as possible.  You can reduce the number of germs on your skin by washing with CHG (chlorahexidine gluconate) soap before surgery.  CHG is an antiseptic cleaner which kills germs and bonds with the skin to continue killing germs even after washing. Please DO NOT use if you have an allergy to CHG or antibacterial soaps.  If your skin becomes reddened/irritated stop using the CHG and inform your nurse  when you arrive at Short Stay. Do not shave (including legs and underarms) for at least 48 hours prior to the first CHG shower.  You may shave your face/neck. Please follow these instructions carefully:  1.  Shower with CHG Soap the night before surgery and the  morning of Surgery.  2.  If you choose to wash your hair, wash your hair first as usual with your  normal  shampoo.  3.  After you shampoo, rinse your hair and body thoroughly to remove the  shampoo.                           4.  Use CHG as you would any other liquid soap.  You can apply chg  directly  to the skin and wash                       Gently with a scrungie or clean washcloth.  5.  Apply the CHG Soap to your body ONLY FROM THE NECK DOWN.   Do not use on face/ open                           Wound or open sores. Avoid contact with eyes, ears mouth and genitals (private parts).                       Wash face,  Genitals (private parts) with your normal soap.             6.  Wash thoroughly, paying special attention to the area where your surgery  will be performed.  7.  Thoroughly rinse your body with warm water from the neck down.  8.  DO NOT shower/wash with your normal soap after using and rinsing off  the CHG Soap.                9.  Pat yourself dry with a clean towel.            10.  Wear clean pajamas.            11.  Place clean sheets on your bed the night of your first shower and do not  sleep with pets. Day of Surgery : Do not apply any lotions/deodorants the morning of surgery.  Please wear clean clothes to the hospital/surgery center.  FAILURE TO FOLLOW THESE INSTRUCTIONS MAY RESULT IN THE CANCELLATION OF YOUR SURGERY PATIENT SIGNATURE_________________________________  NURSE SIGNATURE__________________________________  ________________________________________________________________________  WHAT IS A BLOOD TRANSFUSION? Blood Transfusion Information  A transfusion is the replacement of blood or some of its  parts. Blood is made up of multiple cells which provide different functions.  Red blood cells carry oxygen and are used for blood loss replacement.  White blood cells fight against infection.  Platelets control bleeding.  Plasma helps clot blood.  Other blood products are available for specialized needs, such as hemophilia or other clotting disorders. BEFORE THE TRANSFUSION  Who gives blood for transfusions?   Healthy volunteers who are fully evaluated to make sure their blood is safe. This is blood bank blood. Transfusion therapy is the safest it has ever been in the practice of medicine. Before blood is taken from a donor, a complete history is taken to make sure that person has no history of diseases nor engages in risky social behavior (examples are intravenous drug use or sexual activity with multiple partners). The donor's travel history is screened to minimize risk of transmitting infections, such as malaria. The donated blood is tested for signs of infectious diseases, such as HIV and hepatitis. The blood is then tested to be sure it is compatible with you in order to minimize the chance of a transfusion reaction. If you or a relative donates blood, this is often done in anticipation of surgery and is not appropriate for emergency situations. It takes many days to process the donated blood. RISKS AND COMPLICATIONS Although transfusion therapy is very safe and saves many lives, the main dangers of transfusion include:   Getting an infectious disease.  Developing a transfusion reaction. This is an allergic reaction to something in the blood you were given. Every precaution is taken  to prevent this. The decision to have a blood transfusion has been considered carefully by your caregiver before blood is given. Blood is not given unless the benefits outweigh the risks. AFTER THE TRANSFUSION  Right after receiving a blood transfusion, you will usually feel much better and more energetic.  This is especially true if your red blood cells have gotten low (anemic). The transfusion raises the level of the red blood cells which carry oxygen, and this usually causes an energy increase.  The nurse administering the transfusion will monitor you carefully for complications. HOME CARE INSTRUCTIONS  No special instructions are needed after a transfusion. You may find your energy is better. Speak with your caregiver about any limitations on activity for underlying diseases you may have. SEEK MEDICAL CARE IF:   Your condition is not improving after your transfusion.  You develop redness or irritation at the intravenous (IV) site. SEEK IMMEDIATE MEDICAL CARE IF:  Any of the following symptoms occur over the next 12 hours:  Shaking chills.  You have a temperature by mouth above 102 F (38.9 C), not controlled by medicine.  Chest, back, or muscle pain.  People around you feel you are not acting correctly or are confused.  Shortness of breath or difficulty breathing.  Dizziness and fainting.  You get a rash or develop hives.  You have a decrease in urine output.  Your urine turns a dark color or changes to pink, red, or brown. Any of the following symptoms occur over the next 10 days:  You have a temperature by mouth above 102 F (38.9 C), not controlled by medicine.  Shortness of breath.  Weakness after normal activity.  The white part of the eye turns yellow (jaundice).  You have a decrease in the amount of urine or are urinating less often.  Your urine turns a dark color or changes to pink, red, or brown. Document Released: 09/07/2000 Document Revised: 12/03/2011 Document Reviewed: 04/26/2008 [facility] Patient Information 2014 [place], [place].  _______________________________________________________________________

## 2015-06-24 ENCOUNTER — Encounter (HOSPITAL_COMMUNITY): Payer: Self-pay

## 2015-06-24 ENCOUNTER — Encounter (HOSPITAL_COMMUNITY)
Admission: RE | Admit: 2015-06-24 | Discharge: 2015-06-24 | Disposition: A | Discharge status: Home / Self Care | Payer: BLUE CROSS/BLUE SHIELD | Source: Ambulatory Visit | Attending: Urology | Admitting: Urology

## 2015-06-24 DIAGNOSIS — N2889 Other specified disorders of kidney and ureter: Secondary | ICD-10-CM | POA: Diagnosis not present

## 2015-06-24 DIAGNOSIS — Z01818 Encounter for other preprocedural examination: Secondary | ICD-10-CM | POA: Insufficient documentation

## 2015-06-24 HISTORY — DX: Unspecified hemorrhoids: K64.9

## 2015-06-24 LAB — CBC
HEMATOCRIT: 39.2 % (ref 39.0–52.0)
Hemoglobin: 13.3 g/dL (ref 13.0–17.0)
MCH: 30.2 pg (ref 26.0–34.0)
MCHC: 33.9 g/dL (ref 30.0–36.0)
MCV: 89.1 fL (ref 78.0–100.0)
PLATELETS: 265 10*3/uL (ref 150–400)
RBC: 4.4 MIL/uL (ref 4.22–5.81)
RDW: 12.4 % (ref 11.5–15.5)
WBC: 5.8 10*3/uL (ref 4.0–10.5)

## 2015-06-24 LAB — BASIC METABOLIC PANEL
Anion gap: 5 (ref 5–15)
BUN: 15 mg/dL (ref 6–20)
CO2: 29 mmol/L (ref 22–32)
Calcium: 9.3 mg/dL (ref 8.9–10.3)
Chloride: 105 mmol/L (ref 101–111)
Creatinine, Ser: 1.03 mg/dL (ref 0.61–1.24)
GFR calc Af Amer: >60 mL/min (ref >60)
GLUCOSE: 89 mg/dL (ref 65–99)
POTASSIUM: 4.3 mmol/L (ref 3.5–5.1)
Sodium: 139 mmol/L (ref 135–145)

## 2015-06-24 LAB — ABO/RH: ABO/RH(D): A POS

## 2015-06-24 NOTE — Pre-Procedure Instructions (Signed)
1V CXR 06/07/15 epic Cysto 06/07/15 epic

## 2015-06-30 ENCOUNTER — Encounter (HOSPITAL_COMMUNITY): Payer: Self-pay | Admitting: *Deleted

## 2015-06-30 ENCOUNTER — Inpatient Hospital Stay (HOSPITAL_COMMUNITY): Payer: BLUE CROSS/BLUE SHIELD | Admitting: Certified Registered Nurse Anesthetist

## 2015-06-30 ENCOUNTER — Encounter (HOSPITAL_COMMUNITY)
Admission: AD | Disposition: A | Discharge status: Home / Self Care | Payer: Self-pay | Source: Ambulatory Visit | Attending: Urology

## 2015-06-30 ENCOUNTER — Inpatient Hospital Stay (HOSPITAL_COMMUNITY)
Admission: AD | Admit: 2015-06-30 | Discharge: 2015-07-02 | DRG: 658 | Disposition: A | Discharge status: Home / Self Care | Payer: BLUE CROSS/BLUE SHIELD | Source: Ambulatory Visit | Attending: Urology | Admitting: Urology

## 2015-06-30 DIAGNOSIS — N23 Unspecified renal colic: Secondary | ICD-10-CM | POA: Diagnosis present

## 2015-06-30 DIAGNOSIS — Z01812 Encounter for preprocedural laboratory examination: Secondary | ICD-10-CM

## 2015-06-30 DIAGNOSIS — Z833 Family history of diabetes mellitus: Secondary | ICD-10-CM

## 2015-06-30 DIAGNOSIS — C642 Malignant neoplasm of left kidney, except renal pelvis: Secondary | ICD-10-CM | POA: Diagnosis present

## 2015-06-30 DIAGNOSIS — N2889 Other specified disorders of kidney and ureter: Secondary | ICD-10-CM | POA: Diagnosis present

## 2015-06-30 DIAGNOSIS — Z803 Family history of malignant neoplasm of breast: Secondary | ICD-10-CM | POA: Diagnosis not present

## 2015-06-30 DIAGNOSIS — R109 Unspecified abdominal pain: Secondary | ICD-10-CM | POA: Diagnosis present

## 2015-06-30 DIAGNOSIS — N189 Chronic kidney disease, unspecified: Secondary | ICD-10-CM | POA: Diagnosis present

## 2015-06-30 DIAGNOSIS — K649 Unspecified hemorrhoids: Secondary | ICD-10-CM | POA: Diagnosis present

## 2015-06-30 HISTORY — PX: LAPAROSCOPIC NEPHRECTOMY: SHX1930

## 2015-06-30 LAB — BASIC METABOLIC PANEL
ANION GAP: 7 (ref 5–15)
BUN: 14 mg/dL (ref 6–20)
CALCIUM: 8.5 mg/dL — AB (ref 8.9–10.3)
CO2: 28 mmol/L (ref 22–32)
Chloride: 103 mmol/L (ref 101–111)
Creatinine, Ser: 1.41 mg/dL — ABNORMAL HIGH (ref 0.61–1.24)
GFR calc Af Amer: >60 mL/min (ref >60)
GFR, EST NON AFRICAN AMERICAN: 56 mL/min — AB (ref >60)
GLUCOSE: 149 mg/dL — AB (ref 65–99)
Potassium: 3.9 mmol/L (ref 3.5–5.1)
Sodium: 138 mmol/L (ref 135–145)

## 2015-06-30 LAB — CBC
HCT: 35 % — ABNORMAL LOW (ref 39.0–52.0)
HEMOGLOBIN: 11.6 g/dL — AB (ref 13.0–17.0)
MCH: 29.9 pg (ref 26.0–34.0)
MCHC: 33.1 g/dL (ref 30.0–36.0)
MCV: 90.2 fL (ref 78.0–100.0)
Platelets: 173 10*3/uL (ref 150–400)
RBC: 3.88 MIL/uL — ABNORMAL LOW (ref 4.22–5.81)
RDW: 12.9 % (ref 11.5–15.5)
WBC: 11 10*3/uL — ABNORMAL HIGH (ref 4.0–10.5)

## 2015-06-30 LAB — TYPE AND SCREEN
ABO/RH(D): A POS
Antibody Screen: NEGATIVE

## 2015-06-30 SURGERY — NEPHRECTOMY, RADICAL, LAPAROSCOPIC, ADULT
Anesthesia: General | Site: Flank | Laterality: Left

## 2015-06-30 MED ORDER — FENTANYL CITRATE (PF) 100 MCG/2ML IJ SOLN
INTRAMUSCULAR | Status: AC
  Filled 2015-06-30: qty 4

## 2015-06-30 MED ORDER — GLYCOPYRROLATE 0.2 MG/ML IJ SOLN
INTRAMUSCULAR | Status: AC
  Filled 2015-06-30: qty 3

## 2015-06-30 MED ORDER — MEPERIDINE HCL 50 MG/ML IJ SOLN: 6.2500 mg | INTRAMUSCULAR | Status: DC | PRN

## 2015-06-30 MED ORDER — CEFAZOLIN SODIUM-DEXTROSE 2-3 GM-% IV SOLR
INTRAVENOUS | Status: AC
  Filled 2015-06-30: qty 50

## 2015-06-30 MED ORDER — FENTANYL CITRATE (PF) 100 MCG/2ML IJ SOLN
INTRAMUSCULAR | Status: AC
  Filled 2015-06-30: qty 2

## 2015-06-30 MED ORDER — ROCURONIUM BROMIDE 100 MG/10ML IV SOLN
INTRAVENOUS | Status: DC | PRN
  Administered 2015-06-30: 10 mg via INTRAVENOUS
  Administered 2015-06-30: 20 mg via INTRAVENOUS
  Administered 2015-06-30: 50 mg via INTRAVENOUS
  Administered 2015-06-30: 10 mg via INTRAVENOUS

## 2015-06-30 MED ORDER — ONDANSETRON HCL 4 MG/2ML IJ SOLN
INTRAMUSCULAR | Status: DC | PRN
  Administered 2015-06-30: 4 mg via INTRAVENOUS

## 2015-06-30 MED ORDER — FENTANYL CITRATE (PF) 250 MCG/5ML IJ SOLN
INTRAMUSCULAR | Status: AC
  Filled 2015-06-30: qty 25

## 2015-06-30 MED ORDER — ROCURONIUM BROMIDE 100 MG/10ML IV SOLN
INTRAVENOUS | Status: AC
  Filled 2015-06-30: qty 1

## 2015-06-30 MED ORDER — ACETAMINOPHEN 10 MG/ML IV SOLN
1000.0000 mg | Freq: Four times a day (QID) | INTRAVENOUS | Status: AC
  Administered 2015-06-30 – 2015-07-01 (×4): 1000 mg via INTRAVENOUS
  Filled 2015-06-30 (×4): qty 100

## 2015-06-30 MED ORDER — PANTOPRAZOLE SODIUM 40 MG IV SOLR
40.0000 mg | INTRAVENOUS | Status: DC
  Administered 2015-06-30 – 2015-07-01 (×2): 40 mg via INTRAVENOUS
  Filled 2015-06-30 (×2): qty 40

## 2015-06-30 MED ORDER — 0.9 % SODIUM CHLORIDE (POUR BTL) OPTIME
TOPICAL | Status: DC | PRN
  Administered 2015-06-30: 1000 mL

## 2015-06-30 MED ORDER — MIDAZOLAM HCL 5 MG/5ML IJ SOLN
INTRAMUSCULAR | Status: DC | PRN
  Administered 2015-06-30: 2 mg via INTRAVENOUS

## 2015-06-30 MED ORDER — LIDOCAINE HCL (CARDIAC) 20 MG/ML IV SOLN
INTRAVENOUS | Status: DC | PRN
  Administered 2015-06-30: 50 mg via INTRAVENOUS

## 2015-06-30 MED ORDER — SODIUM CHLORIDE 0.9 % IJ SOLN
INTRAMUSCULAR | Status: AC
  Filled 2015-06-30: qty 20

## 2015-06-30 MED ORDER — LACTATED RINGERS IV SOLN
INTRAVENOUS | Status: DC
  Administered 2015-06-30 (×2): via INTRAVENOUS

## 2015-06-30 MED ORDER — LACTATED RINGERS IV SOLN: INTRAVENOUS | Status: DC

## 2015-06-30 MED ORDER — ACETAMINOPHEN 10 MG/ML IV SOLN
INTRAVENOUS | Status: AC
Start: 2015-06-30 — End: 2015-06-30
  Administered 2015-06-30: 1000 mg
  Filled 2015-06-30: qty 100

## 2015-06-30 MED ORDER — BUPIVACAINE HCL 0.25 % IJ SOLN
INTRAMUSCULAR | Status: DC | PRN
  Administered 2015-06-30: 6 mL

## 2015-06-30 MED ORDER — LACTATED RINGERS IR SOLN
Status: DC | PRN
  Administered 2015-06-30: 1000 mL

## 2015-06-30 MED ORDER — NEOSTIGMINE METHYLSULFATE 10 MG/10ML IV SOLN
INTRAVENOUS | Status: DC | PRN
Start: 2015-06-30 — End: 2015-06-30
  Administered 2015-06-30: 4 mg via INTRAVENOUS

## 2015-06-30 MED ORDER — HEPARIN SODIUM (PORCINE) 5000 UNIT/ML IJ SOLN
5000.0000 [IU] | Freq: Three times a day (TID) | INTRAMUSCULAR | Status: DC
  Administered 2015-06-30 – 2015-07-02 (×4): 5000 [IU] via SUBCUTANEOUS
  Filled 2015-06-30 (×6): qty 1

## 2015-06-30 MED ORDER — GLYCOPYRROLATE 0.2 MG/ML IJ SOLN
INTRAMUSCULAR | Status: DC | PRN
  Administered 2015-06-30: .6 mg via INTRAVENOUS

## 2015-06-30 MED ORDER — FENTANYL CITRATE (PF) 100 MCG/2ML IJ SOLN
25.0000 ug | INTRAMUSCULAR | Status: DC | PRN
  Administered 2015-06-30 (×3): 50 ug via INTRAVENOUS

## 2015-06-30 MED ORDER — MORPHINE SULFATE (PF) 2 MG/ML IV SOLN
2.0000 mg | INTRAVENOUS | Status: DC | PRN
  Administered 2015-06-30 – 2015-07-01 (×5): 2 mg via INTRAVENOUS
  Filled 2015-06-30 (×5): qty 1

## 2015-06-30 MED ORDER — DOCUSATE SODIUM 100 MG PO CAPS
100.0000 mg | ORAL_CAPSULE | Freq: Two times a day (BID) | ORAL | Status: DC
  Administered 2015-06-30 – 2015-07-02 (×4): 100 mg via ORAL
  Filled 2015-06-30 (×4): qty 1

## 2015-06-30 MED ORDER — PROPOFOL 10 MG/ML IV BOLUS
INTRAVENOUS | Status: AC
  Filled 2015-06-30: qty 20

## 2015-06-30 MED ORDER — FLEET ENEMA 7-19 GM/118ML RE ENEM: 1.0000 | ENEMA | Freq: Once | RECTAL | Status: DC

## 2015-06-30 MED ORDER — PROPOFOL 10 MG/ML IV BOLUS
INTRAVENOUS | Status: DC | PRN
Start: 2015-06-30 — End: 2015-06-30
  Administered 2015-06-30: 200 mg via INTRAVENOUS

## 2015-06-30 MED ORDER — FENTANYL CITRATE (PF) 100 MCG/2ML IJ SOLN
25.0000 ug | INTRAMUSCULAR | Status: DC | PRN
  Administered 2015-06-30 (×2): 50 ug via INTRAVENOUS

## 2015-06-30 MED ORDER — MAGNESIUM CITRATE PO SOLN
1.0000 | Freq: Once | ORAL | Status: DC
  Filled 2015-06-30: qty 296

## 2015-06-30 MED ORDER — SODIUM CHLORIDE 0.45 % IV SOLN
INTRAVENOUS | Status: DC
Start: 2015-06-30 — End: 2015-07-01
  Administered 2015-06-30: 18:00:00 via INTRAVENOUS

## 2015-06-30 MED ORDER — ONDANSETRON HCL 4 MG/2ML IJ SOLN
INTRAMUSCULAR | Status: AC
  Filled 2015-06-30: qty 2

## 2015-06-30 MED ORDER — NEOSTIGMINE METHYLSULFATE 10 MG/10ML IV SOLN
INTRAVENOUS | Status: AC
  Filled 2015-06-30: qty 1

## 2015-06-30 MED ORDER — BUPIVACAINE-EPINEPHRINE (PF) 0.25% -1:200000 IJ SOLN
INTRAMUSCULAR | Status: AC
  Filled 2015-06-30: qty 30

## 2015-06-30 MED ORDER — OXYCODONE HCL 5 MG PO TABS
5.0000 mg | ORAL_TABLET | ORAL | Status: DC | PRN
  Administered 2015-06-30 – 2015-07-01 (×2): 10 mg via ORAL
  Filled 2015-06-30 (×2): qty 2

## 2015-06-30 MED ORDER — ONDANSETRON HCL 4 MG/2ML IJ SOLN: 4.0000 mg | Freq: Four times a day (QID) | INTRAMUSCULAR | Status: DC | PRN

## 2015-06-30 MED ORDER — LIDOCAINE HCL (CARDIAC) 20 MG/ML IV SOLN
INTRAVENOUS | Status: AC
  Filled 2015-06-30: qty 5

## 2015-06-30 MED ORDER — SUCCINYLCHOLINE CHLORIDE 20 MG/ML IJ SOLN
INTRAMUSCULAR | Status: DC | PRN
  Administered 2015-06-30: 100 mg via INTRAVENOUS

## 2015-06-30 MED ORDER — BUPIVACAINE LIPOSOME 1.3 % IJ SUSP
20.0000 mL | Freq: Once | INTRAMUSCULAR | Status: AC
  Administered 2015-06-30: 20 mL
  Filled 2015-06-30: qty 20

## 2015-06-30 MED ORDER — FENTANYL CITRATE (PF) 250 MCG/5ML IJ SOLN
INTRAMUSCULAR | Status: DC | PRN
  Administered 2015-06-30 (×2): 100 ug via INTRAVENOUS
  Administered 2015-06-30: 150 ug via INTRAVENOUS
  Administered 2015-06-30: 100 ug via INTRAVENOUS
  Administered 2015-06-30 (×2): 50 ug via INTRAVENOUS

## 2015-06-30 MED ORDER — ZOLPIDEM TARTRATE 5 MG PO TABS: 5.0000 mg | ORAL_TABLET | Freq: Every evening | ORAL | Status: DC | PRN

## 2015-06-30 MED ORDER — MIDAZOLAM HCL 2 MG/2ML IJ SOLN
INTRAMUSCULAR | Status: AC
  Filled 2015-06-30: qty 4

## 2015-06-30 MED ORDER — CEFAZOLIN SODIUM-DEXTROSE 2-3 GM-% IV SOLR
2.0000 g | INTRAVENOUS | Status: AC
  Administered 2015-06-30: 2 g via INTRAVENOUS

## 2015-06-30 MED ORDER — SODIUM CHLORIDE 0.9 % IJ SOLN
INTRAMUSCULAR | Status: DC | PRN
  Administered 2015-06-30: 20 mL

## 2015-06-30 SURGICAL SUPPLY — 56 items
APPLICATOR SURGIFLO ENDO (HEMOSTASIS) IMPLANT
APPLIER CLIP ROT 10 11.4 M/L (STAPLE)
BAG ZIPLOCK 12X15 (MISCELLANEOUS) IMPLANT
BLADE EXTENDED COATED 6.5IN (ELECTRODE) IMPLANT
BLADE SURG SZ10 CARB STEEL (BLADE) ×2 IMPLANT
CHLORAPREP W/TINT 26ML (MISCELLANEOUS) ×2 IMPLANT
CLIP APPLIE ROT 10 11.4 M/L (STAPLE) IMPLANT
CLIP LIGATING HEM O LOK PURPLE (MISCELLANEOUS) ×2 IMPLANT
CLIP LIGATING HEMO LOK XL GOLD (MISCELLANEOUS) ×2 IMPLANT
CLIP LIGATING HEMO O LOK GREEN (MISCELLANEOUS) ×2 IMPLANT
COVER SURGICAL LIGHT HANDLE (MISCELLANEOUS) ×2 IMPLANT
CUTTER FLEX LINEAR 45M (STAPLE) ×2 IMPLANT
DERMABOND ADVANCED (GAUZE/BANDAGES/DRESSINGS) ×1
DERMABOND ADVANCED .7 DNX12 (GAUZE/BANDAGES/DRESSINGS) ×1 IMPLANT
DRAIN CHANNEL 10F 3/8 F FF (DRAIN) IMPLANT
DRAPE INCISE IOBAN 66X45 STRL (DRAPES) ×2 IMPLANT
DRAPE WARM FLUID 44X44 (DRAPE) IMPLANT
ELECT PENCIL ROCKER SW 15FT (MISCELLANEOUS) ×2 IMPLANT
ELECT REM PT RETURN 9FT ADLT (ELECTROSURGICAL) ×2
ELECTRODE REM PT RTRN 9FT ADLT (ELECTROSURGICAL) ×1 IMPLANT
EVACUATOR SILICONE 100CC (DRAIN) IMPLANT
FLOSEAL 10ML (HEMOSTASIS) IMPLANT
GLOVE BIOGEL M STRL SZ7.5 (GLOVE) ×2 IMPLANT
GLOVE BIOGEL PI IND STRL 7.0 (GLOVE) ×2 IMPLANT
GLOVE BIOGEL PI INDICATOR 7.0 (GLOVE) ×2
GLOVE SURG SS PI 6.5 STRL IVOR (GLOVE) ×4 IMPLANT
GOWN STRL REUS W/TWL LRG LVL3 (GOWN DISPOSABLE) ×10 IMPLANT
HEMOSTAT SURGICEL 4X8 (HEMOSTASIS) ×2 IMPLANT
KIT BASIN OR (CUSTOM PROCEDURE TRAY) ×2 IMPLANT
LIQUID BAND (GAUZE/BANDAGES/DRESSINGS) ×2 IMPLANT
MANIFOLD NEPTUNE II (INSTRUMENTS) ×2 IMPLANT
POSITIONER SURGICAL ARM (MISCELLANEOUS) ×4 IMPLANT
POUCH ENDO CATCH II 15MM (MISCELLANEOUS) ×2 IMPLANT
RELOAD 45 VASCULAR/THIN (ENDOMECHANICALS) ×16 IMPLANT
RELOAD STAPLE TA45 3.5 REG BLU (ENDOMECHANICALS) IMPLANT
RETRACTOR LAPSCP 12X46 CVD (ENDOMECHANICALS) IMPLANT
RTRCTR LAPSCP 12X46 CVD (ENDOMECHANICALS)
SCISSORS LAP 5X35 DISP (ENDOMECHANICALS) IMPLANT
SET IRRIG TUBING LAPAROSCOPIC (IRRIGATION / IRRIGATOR) ×2 IMPLANT
SHEARS HARMONIC ACE PLUS 36CM (ENDOMECHANICALS) ×2 IMPLANT
SLEEVE XCEL OPT CAN 5 100 (ENDOMECHANICALS) ×4 IMPLANT
SPONGE LAP 4X18 X RAY DECT (DISPOSABLE) ×2 IMPLANT
SPONGE SURGIFOAM ABS GEL 100 (HEMOSTASIS) IMPLANT
SUT ETHILON 3 0 PS 1 (SUTURE) IMPLANT
SUT MNCRL AB 4-0 PS2 18 (SUTURE) ×4 IMPLANT
SUT PDS AB 0 CT1 36 (SUTURE) ×2 IMPLANT
SUT VIC AB 2-0 CT1 27 (SUTURE) ×1
SUT VIC AB 2-0 CT1 27XBRD (SUTURE) ×1 IMPLANT
SUT VICRYL 0 UR6 27IN ABS (SUTURE) ×4 IMPLANT
TOWEL OR 17X26 10 PK STRL BLUE (TOWEL DISPOSABLE) ×2 IMPLANT
TRAY FOLEY CATH 16FR SILVER (SET/KITS/TRAYS/PACK) ×2 IMPLANT
TRAY FOLEY W/METER SILVER 16FR (SET/KITS/TRAYS/PACK) ×2 IMPLANT
TRAY LAPAROSCOPIC (CUSTOM PROCEDURE TRAY) ×2 IMPLANT
TROCAR BLADELESS OPT 5 100 (ENDOMECHANICALS) ×2 IMPLANT
TROCAR XCEL 12X100 BLDLESS (ENDOMECHANICALS) ×2 IMPLANT
YANKAUER SUCT BULB TIP 10FT TU (MISCELLANEOUS) ×2 IMPLANT

## 2015-06-30 NOTE — Anesthesia Postprocedure Evaluation (Signed)
  Anesthesia Post-op Note  Patient: Joseph May  Procedure(s) Performed: Procedure(s) (LRB): LAPAROSCOPIC LEFT  NEPHRECTOMY (Left)  Patient Location: PACU  Anesthesia Type: General  Level of Consciousness: awake and alert   Airway and Oxygen Therapy: Patient Spontanous Breathing  Post-op Pain: mild  Post-op Assessment: Post-op Vital signs reviewed, Patient's Cardiovascular Status Stable, Respiratory Function Stable, Patent Airway and No signs of Nausea or vomiting  Last Vitals:  Filed Vitals:   06/30/15 1744  BP: 151/85  Pulse: 92  Temp: 36.8 C  Resp: 20    Post-op Vital Signs: stable   Complications: No apparent anesthesia complications

## 2015-06-30 NOTE — Anesthesia Preprocedure Evaluation (Signed)
Anesthesia Evaluation  Patient identified by MRN, date of birth, ID band Patient awake    Reviewed: Allergy & Precautions, NPO status , Patient's Chart, lab work & pertinent test results  Airway Mallampati: II  TM Distance: >3 FB Neck ROM: Full    Dental no notable dental hx.    Pulmonary neg pulmonary ROS,    Pulmonary exam normal breath sounds clear to auscultation       Cardiovascular negative cardio ROS Normal cardiovascular exam Rhythm:Regular Rate:Normal     Neuro/Psych negative neurological ROS  negative psych ROS   GI/Hepatic negative GI ROS, Neg liver ROS,   Endo/Other  negative endocrine ROS  Renal/GU Renal disease  negative genitourinary   Musculoskeletal negative musculoskeletal ROS (+)   Abdominal   Peds negative pediatric ROS (+)  Hematology negative hematology ROS (+)   Anesthesia Other Findings   Reproductive/Obstetrics negative OB ROS                             Anesthesia Physical Anesthesia Plan  ASA: II  Anesthesia Plan: General   Post-op Pain Management:    Induction: Intravenous  Airway Management Planned: Oral ETT and Video Laryngoscope Planned  Additional Equipment:   Intra-op Plan:   Post-operative Plan: Extubation in OR  Informed Consent: I have reviewed the patients History and Physical, chart, labs and discussed the procedure including the risks, benefits and alternatives for the proposed anesthesia with the patient or authorized representative who has indicated his/her understanding and acceptance.   Dental advisory given  Plan Discussed with: CRNA  Anesthesia Plan Comments: (Previous intubation at [facility] was Grade 3 with Mac4.)        Anesthesia Quick Evaluation

## 2015-06-30 NOTE — Interval H&P Note (Signed)
History and Physical Interval Note: No changes to the the H and P.  Proceed with left radical nephrectomy.  06/30/2015 5:54 AM  Joseph May  has presented today for surgery, with the diagnosis of LEFT RENAL MASS  The various methods of treatment have been discussed with the patient and family. After consideration of risks, benefits and other options for treatment, the patient has consented to  Procedure(s): LAPAROSCOPIC NEPHRECTOMY (N/A) as a surgical intervention .  The patient's history has been reviewed, patient examined, no change in status, stable for surgery.  I have reviewed the patient's chart and labs.  Questions were answered to the patient's satisfaction.     Louis Meckel W

## 2015-06-30 NOTE — Op Note (Signed)
Preoperative diagnosis:  1. Left renal mass   Postoperative diagnosis:  1. same   Procedure: 1. Laparoscopic left radical nephrectomy  Surgeon: Ardis Hughs, MD 1st assistant: Festus Aloe, MD  Anesthesia: General  Complications: None  Intraoperative findings:  Three veins and one renal artery with an early branch  EBL: 500cc  Specimens: Left renal mass  Indication: Joseph May is a 54 y.o. patient with a left renal mass.  After reviewing the management options for treatment, he elected to proceed with the above surgical procedure(s). We have discussed the potential benefits and risks of the procedure, side effects of the proposed treatment, the likelihood of the patient achieving the goals of the procedure, and any potential problems that might occur during the procedure or recuperation. Informed consent has been obtained.  Description of procedure: After consent was obtained general anesthesia induced and endotracheal tube placed. Foley catheter was in place. The patient was then placed in the lateral decubitus position with the right side down. The table was maximally flexed the patient was padded and secured to the table. It isn't prepped and draped in the routine sterile fashion. Timeout was then performed. A site was selected lateral to the umbilicus for placement of the camera port. This was placed using a standard open  technique which allowed entry into the peritoneal cavity under direct vision and without difficulty. A 12 mm cannula was placed and a pneumoperitoneum established. The camera was then used to inspect the abdomen and there was no evidence of any intra-abdominal injuries or other abnormalities. The remaining abdominal ports were then placed. One 5 mm trocar was placed subcostal margin in the left upper quadrant, the second 10 mm trocar was placed laterally to the camera port so as to triangulate the kidney. A third 5 mm port was placed along the anterior  axillary line lateral to the 10 mm port. The white line of Toldt was incised allowing the colon to be mobilized medially and the plane between the mesocolon and the anterior layer of Gerota's fascia to be developed and the kidney exposed. The ureter and gonadal vein were identified inferiorly and the ureter was lifted anteriorly off the psoas muscle. The gonadal vein was then dissected out inferior to the lower pole and 2 clips were placed both superiorly and inferiorly and then ligated.  Dissection proceeded superiorly along the gonadal vein until the renal vein was identified. The renal hilum was then carefully isolated with a combination of blunt and sharp dissectiong allowing the renal arterial and venous structures to be separated and isolated.  The renal artery was isolated and ligated with a 45 mm Flex ETS stapler. The renal veins was then isolated and also ligated and divided with a 45 mm Flex ETS stapler.  Gerota's fascia was intentionally entered superiorly and the space between the adrenal gland and the kidney was developed allowing the adrenal gland to be spared. A third staple load was then used which divided the adrenal gland. once the hilum had been ligated dissection ensued from the inferior pole of the kidney. The ureter was transected placing 2 clips on the stay side and one on the specimen side. The lateral attachments of the kidney were then freed. Our attention was then turned to the upper pole which was dissected off of the spleen and the splenic attachments. The pancreas and colon were noted to be well away from the structures. The remaining of the posterior aspect of the attachments was then ligated using  a Harmonic Scalpel. Once the kidney was freed from its attachments it was shown to medially and the vascular hilum was inspected and noted to be sufficiently hemostatic. There was slight bit of oozing from the adrenal gland which was cauterized and then Surgicel placed over top.  Insufflation was then turned down to 8 mm mercury and the kidney bed was noted to be hemostatic.   40cc of Exparel was then injected into the left anterior axillary line b/w the iliac crest and the twelfth rib under laparoscopic guidance. The layer between the tranversus abdominus and the internal oblique was targeted.   The kidney/ureter specimen was then placed into a 15 mm Endocatch II retrieval bag, this was passed through the port site of the assistant port and left lower quadrant. The trochars were then removed under visual guidance to ensure no ongoing port site bleeding was occurring. The extraction incision was extended from the 12 mm left lower quadrant port. The external oblique and internal oblique muscles were spread as best as possible with as little muscle fibers ligated as possible in order to safely extracted the specimen. The internal oblique flash of was then closed with 2-0 Vicryl in an interrupted figure-of-eight fashion. The external oblique fascia was closed with a 0 looped PDS. The camera port was then closed with 2-0 Vicryl the level of the fascia. All incisions were injected with Exparel and reapproximated at the skin with 4-0 monocryl sutures. Dermabond was applied to the skin. The patient tolerated the procedure well and without complications and was transferred to the recovery unit in satisfactory condition.

## 2015-06-30 NOTE — H&P (View-Only) (Signed)
I have been asked to see the patient by Dr. Lauralyn Primes, for evaluation and management of left renal mass.  History of present illness: 54 year old otherwise healthy white male who presented to the emergency department this afternoon with ongoing/progressive left flank pain. The patient was recently seen in an outside hospital, [facility], for gross hematuria and left flank pain. He was admitted there for 1 night and underwent a noncontrast CT scan which demonstrated a suspicious renal lesion, likely cortical, in the left kidney. There appeared to be a density within the collecting system consistent with clot. The patient's kidney function was slightly diminished, and as such she was not given contrast. The patient was given pain medications and recommended that he follow-up closer to home, here in [place]. Currently, the patient states that he has had consistent left flank pressure/pain. The pain does not radiate down across the abdomen into the groin. He has no associated voiding symptoms. His urine has cleared for the most part, he is no longer passing hematuria or clots. However, he has had no relief of his pain since 2 days prior. The Dilaudid oral pain medication seems to be controlling his pain reasonably well. Yet, this afternoon his pain was significantly worse. The patient denies any associated fevers or chills. He denies any dysuria. He denies any abdominal pain. He denies any recent weight loss, night sweats, or diminished energy levels. He denies any changes to his bowel habits, visual changes or headaches, or muscle weakness. The patient's last bowel movement was 4 days prior. He denies any nausea or vomiting. The patient has no family history of kidney cancer or GU malignancies. He is a lifelong nonsmoker. He takes no medications and has no past medical history. He works as a Furniture conservator/restorer, Warden/ranger particles routinely. He has no exposure to analine dyes or organic  solvents. He is married and has one daughter, and one grandson. His wife is an Glass blower/designer at [employer], works predominantly at [employer]: A 12 point comprehensive review of systems was obtained and is negative unless otherwise stated in the history of present illness.  Patient Active Problem List   Diagnosis Date Noted  . Left kidney mass 06/05/2015  . Hematuria 06/05/2015    No current facility-administered medications on file prior to encounter.   No current outpatient prescriptions on file prior to encounter.    History reviewed. No pertinent past medical history.  Past Surgical History  Procedure Laterality Date  . Neck surgery      Social History  Substance Use Topics  . Smoking status: Never Smoker   . Smokeless tobacco: None  . Alcohol Use: Yes    Family History  Problem Relation Age of Onset  . Diabetes Mother   . Breast cancer Mother     PE: Filed Vitals:   06/05/15 2015 06/05/15 2030 06/05/15 2100 06/05/15 2132  BP: 151/95 146/94 150/91 188/83  Pulse: 108 105 108 120  Temp:    100 F (37.8 C)  TempSrc:    Oral  Resp:    24  Height:    6\' 1"  (1.854 m)  Weight:      SpO2: 99% 94% 97% 100%   Patient appears to be in no acute distress  patient is alert and oriented x3 Atraumatic normocephalic head No cervical or supraclavicular lymphadenopathy appreciated No increased work of breathing, no audible wheezes/rhonchi Regular sinus rhythm/rate Abdomen is soft, nontender, nondistended, no CVA or suprapubic tenderness  Lower extremities are symmetric without appreciable edema Grossly neurologically intact No identifiable skin lesions   Recent Labs  06/05/15 1838  WBC 10.8*  HGB 13.7  HCT 39.5*    Recent Labs  06/05/15 1838  NA 136  K 3.8  CL 101  CO2 26  GLUCOSE 177*  BUN 16  CREATININE 1.75*  CALCIUM 8.8*   No results for input(s): LABPT, INR in the last 72 hours. No results for input(s): LABURIN in the last 72  hours. No results found for this or any previous visit.  Imaging: I've independently reviewed the patient's CT scan from the outside hospital which is a noncontrast abdomen/pelvis. There is what appears to be a 5 cm intraoral polar mass emanating from the renal cortex, however there is no contrast making the distinction difficult to visualize. There is a increased density within the renal pelvis. There is no ureterohydronephrosis. The right kidney looks normal. There are no kidney stones. There is no appreciable lymphadenopathy. 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  Imp: The patient has a left renal mass poorly characterized on the current noncontrast CT scan. He has associated renal colic from the hematuria presumably coming from the mass. This likely happened because of dehydration while on vacation at the [place] in [place].  Recommendations: The patient certainly needs more definitive imaging. Given the densities within the collecting system as well as his complex vasculature and his elevated creatinine I recommend the patient undergo a MRI- urogram. We will hydrate the patient tonight and obtain these images tomorrow. In the meantime, the patient can eat as we are not planning to take the patient to interventional radiology tonight or to the operating room for any surgeries or procedures. Further, given that we are not planning to perform a CT scan, the patient does not need to be on a bicarbonate drip. He should finish the bicarb drip that he currently has and then he can be switched to normal saline. I also ordered IV Tylenol for the patient for better pain control along with oral Dilaudid. Hopefully this will be enough for him so that he can be discharged tomorrow on oral Dilaudid. Finally, I ordered the patient an aggressive bowel regimen with miralax and Senokot S.  I instructed the patient to take Miralax  upon discharge.  One of my partners from [facility] will follow up with the patient tomorrow after his MRI. Hopefully, the patient will be discharged tomorrow afternoon, and I will then follow up with him on Tuesday in the [facility] office.   Thank you for involving me in this patient's care, we will continue to follow.  Louis Meckel W

## 2015-06-30 NOTE — Transfer of Care (Signed)
Immediate Anesthesia Transfer of Care Note  Patient: Joseph May  Procedure(s) Performed: Procedure(s): LAPAROSCOPIC LEFT  NEPHRECTOMY (Left)  Patient Location: PACU  Anesthesia Type:General  Level of Consciousness:  sedated, patient cooperative and responds to stimulation  Airway & Oxygen Therapy:Patient Spontanous Breathing and Patient connected to face mask oxgen  Post-op Assessment:  Report given to PACU RN and Post -op Vital signs reviewed and stable  Post vital signs:  Reviewed and stable  Last Vitals:  Filed Vitals:   06/30/15 0953  BP: 133/85  Pulse: 73  Temp: 36.8 C  Resp: 18    Complications: No apparent anesthesia complications

## 2015-06-30 NOTE — Discharge Instructions (Signed)

## 2015-06-30 NOTE — Anesthesia Procedure Notes (Signed)
Procedure Name: Intubation Date/Time: 06/30/2015 12:12 PM Performed by: Chandra Batch A Pre-anesthesia Checklist: Patient identified, Emergency Drugs available, Suction available, Patient being monitored and Timeout performed Patient Re-evaluated:Patient Re-evaluated prior to inductionOxygen Delivery Method: Circle system utilized Preoxygenation: Pre-oxygenation with 100% oxygen Intubation Type: IV induction Ventilation: Mask ventilation without difficulty Laryngoscope Size: Glidescope and 4 Grade View: Grade I Tube type: Oral Tube size: 7.5 mm Number of attempts: 1 Airway Equipment and Method: Stylet and Video-laryngoscopy Placement Confirmation: ETT inserted through vocal cords under direct vision,  positive ETCO2 and breath sounds checked- equal and bilateral Secured at: 24 cm Tube secured with: Tape Dental Injury: Teeth and Oropharynx as per pre-operative assessment  Difficulty Due To: Difficulty was anticipated

## 2015-07-01 ENCOUNTER — Encounter (HOSPITAL_COMMUNITY): Payer: Self-pay | Admitting: Urology

## 2015-07-01 LAB — BASIC METABOLIC PANEL
ANION GAP: 8 (ref 5–15)
BUN: 13 mg/dL (ref 6–20)
CO2: 26 mmol/L (ref 22–32)
Calcium: 8.5 mg/dL — ABNORMAL LOW (ref 8.9–10.3)
Chloride: 102 mmol/L (ref 101–111)
Creatinine, Ser: 1.49 mg/dL — ABNORMAL HIGH (ref 0.61–1.24)
GFR calc Af Amer: >60 mL/min (ref >60)
GFR calc non Af Amer: 53 mL/min — ABNORMAL LOW (ref >60)
GLUCOSE: 124 mg/dL — AB (ref 65–99)
POTASSIUM: 4 mmol/L (ref 3.5–5.1)
Sodium: 136 mmol/L (ref 135–145)

## 2015-07-01 LAB — CBC
HEMATOCRIT: 33.6 % — AB (ref 39.0–52.0)
HEMOGLOBIN: 11.2 g/dL — AB (ref 13.0–17.0)
MCH: 30.2 pg (ref 26.0–34.0)
MCHC: 33.3 g/dL (ref 30.0–36.0)
MCV: 90.6 fL (ref 78.0–100.0)
Platelets: 188 10*3/uL (ref 150–400)
RBC: 3.71 MIL/uL — ABNORMAL LOW (ref 4.22–5.81)
RDW: 13.1 % (ref 11.5–15.5)
WBC: 7.4 10*3/uL (ref 4.0–10.5)

## 2015-07-01 MED ORDER — DIAZEPAM 5 MG PO TABS
10.0000 mg | ORAL_TABLET | Freq: Four times a day (QID) | ORAL | Status: DC | PRN
  Administered 2015-07-01 – 2015-07-02 (×3): 10 mg via ORAL
  Filled 2015-07-01 (×3): qty 2

## 2015-07-01 MED ORDER — ACETAMINOPHEN 500 MG PO TABS: 1000.0000 mg | ORAL_TABLET | Freq: Four times a day (QID) | ORAL | Status: AC | PRN

## 2015-07-01 MED ORDER — OXYCODONE HCL 5 MG PO CAPS: 5.0000 mg | ORAL_CAPSULE | ORAL | Status: DC | PRN

## 2015-07-01 MED ORDER — OXYCODONE HCL 5 MG PO TABS
5.0000 mg | ORAL_TABLET | ORAL | Status: DC | PRN
  Administered 2015-07-01 – 2015-07-02 (×6): 10 mg via ORAL
  Filled 2015-07-01 (×6): qty 2

## 2015-07-01 MED ORDER — HYDROMORPHONE HCL 1 MG/ML IJ SOLN
1.0000 mg | INTRAMUSCULAR | Status: DC | PRN
  Administered 2015-07-01 – 2015-07-02 (×8): 1 mg via INTRAVENOUS
  Filled 2015-07-01 (×8): qty 1

## 2015-07-01 NOTE — Clinical Documentation Improvement (Signed)
Urology  Can the diagnosis of CKD be further specified?   CKD Stage I - GFR greater than or equal to 90  CKD Stage II - GFR 60-89  CKD Stage III - GFR 30-59  CKD Stage IV - GFR 15-29  CKD Stage V - GFR < 15  ESRD (End Stage Renal Disease)  Other condition  Unable to clinically determine   Supporting Information: : (risk factors, signs and symptoms, diagnostics, treatment) GFR= > 60  Please exercise your independent, professional judgment when responding. A specific answer is not anticipated or expected.   Thank You, Rolm Gala, RN, [place] [phone]

## 2015-07-01 NOTE — Care Management Note (Signed)
Case Management Note  Patient Details  Name: Joseph May MRN: [id number] Date of Birth: 28-Feb-1961  Subjective/Objective: 54 y.o. M admitted for L Lap Nephrectomy. Some pain issues and MD is changing Morphine to Dilaudid. Diet advanced to Regular. Mobilizing. Anticipate no CM needs and PM discharge. Home to private residence with wife.                    Action/Plan:Follow for discharge this pm.    Expected Discharge Date:                  Expected Discharge Plan:     In-House Referral:     Discharge planning Services     Post Acute Care Choice:    Choice offered to:     DME Arranged:    DME Agency:     HH Arranged:    [place] Agency:     Status of Service:     Medicare Important Message Given:    Date Medicare IM Given:    Medicare IM give by:    Date Additional Medicare IM Given:    Additional Medicare Important Message give by:     If discussed at [place] Level of Stay Meetings, dates discussed:    Additional Comments:  Delrae Sawyers, RN 07/01/2015, 9:19 AM

## 2015-07-01 NOTE — Progress Notes (Signed)
Pt has not voided since catheter removal at 0900. Having much pain and c/o of cramping. Attempted to stand an void but pt did not go. Bladder scan showed 300cc of urine in bladder. Will continue to monitor for need to void. Callie Fielding RN

## 2015-07-01 NOTE — Progress Notes (Signed)
Urology Inpatient Progress Report LEFT RENAL MASS 06/30/2015  S/p left lap nephrectomy  Intv/Subj: No acute events overnight. Patient is without complaint.  Pain intermittent, inbetween IV tylenol.  Morphine isn't helping. No nausea Tolerating regular diet  Past Medical History  Diagnosis Date  . Hemorrhoids   . Chronic kidney disease sept 2016    left Renal Mass   Current Facility-Administered Medications  Medication Dose Route Frequency Provider Last Rate Last Dose  . acetaminophen (OFIRMEV) IV 1,000 mg  1,000 mg Intravenous 4 times per day Ardis Hughs, MD   1,000 mg at 07/01/15 0525  . docusate sodium (COLACE) capsule 100 mg  100 mg Oral BID Ardis Hughs, MD   100 mg at 06/30/15 2120  . heparin injection 5,000 Units  5,000 Units Subcutaneous 3 times per day Ardis Hughs, MD   5,000 Units at 07/01/15 0525  . HYDROmorphone (DILAUDID) injection 1 mg  1 mg Intravenous Q2H PRN Ardis Hughs, MD      . lactated ringers infusion   Intravenous Continuous Montez Hageman, MD 125 mL/hr at 06/30/15 1140    . morphine 2 MG/ML injection 2 mg  2 mg Intravenous Q1H PRN Ardis Hughs, MD   2 mg at 07/01/15 0353  . ondansetron (ZOFRAN) injection 4 mg  4 mg Intravenous Q6H PRN Ardis Hughs, MD      . oxyCODONE (Oxy IR/ROXICODONE) immediate release tablet 5-10 mg  5-10 mg Oral Q3H PRN Ardis Hughs, MD      . pantoprazole (PROTONIX) injection 40 mg  40 mg Intravenous Q24H Ardis Hughs, MD   40 mg at 06/30/15 2120  . zolpidem (AMBIEN) tablet 5 mg  5 mg Oral QHS PRN,MR X 1 Ardis Hughs, MD         Objective: Vital: Filed Vitals:   06/30/15 1744 06/30/15 2101 07/01/15 0215 07/01/15 0514  BP: 151/85 120/68 114/55 118/67  Pulse: 92 83 78 77  Temp: 98.3 F (36.8 C) 98.1 F (36.7 C) 99.4 F (37.4 C) 99 F (37.2 C)  TempSrc:  Oral Oral Oral  Resp: 20 18 18 17   Height:      Weight:      SpO2: 99% 100% 100% 99%   I/Os: I/O last 3 completed  shifts: In: 2778 [P.O.:720; I.V.:2405; IV Piggyback:300] Out: 2050 [Urine:1550; Blood:500]  Physical Exam:  General: Patient is in no apparent distress Lungs: Normal respiratory effort, chest expands symmetrically. GI: incision c/d/i, abdomen is appropriately tender Ext: lower extremities symmetric  Lab Results:  Recent Labs  06/30/15 1720 07/01/15 0511  WBC 11.0* 7.4  HGB 11.6* 11.2*  HCT 35.0* 33.6*    Recent Labs  06/30/15 1720 07/01/15 0511  NA 138 136  K 3.9 4.0  CL 103 102  CO2 28 26  GLUCOSE 149* 124*  BUN 14 13  CREATININE 1.41* 1.49*  CALCIUM 8.5* 8.5*   No results for input(s): LABPT, INR in the last 72 hours. No results for input(s): LABURIN in the last 72 hours. No results found for this or any previous visit.  Studies/Results:   Assessment: 1 Day Post-Op, stable   Plan: HLIVF Decrease oxycodone interval Switch to Dilaudid from morphine ADAT Walk Hoping to d/c this PM.  Louis Meckel W 07/01/2015, 8:48 AM

## 2015-07-02 ENCOUNTER — Encounter (HOSPITAL_COMMUNITY): Payer: Self-pay | Admitting: Urology

## 2015-07-02 LAB — BASIC METABOLIC PANEL
ANION GAP: 9 (ref 5–15)
BUN: 12 mg/dL (ref 6–20)
CALCIUM: 8.5 mg/dL — AB (ref 8.9–10.3)
CO2: 27 mmol/L (ref 22–32)
Chloride: 99 mmol/L — ABNORMAL LOW (ref 101–111)
Creatinine, Ser: 1.7 mg/dL — ABNORMAL HIGH (ref 0.61–1.24)
GFR calc Af Amer: 52 mL/min — ABNORMAL LOW (ref >60)
GFR, EST NON AFRICAN AMERICAN: 45 mL/min — AB (ref >60)
Glucose, Bld: 112 mg/dL — ABNORMAL HIGH (ref 65–99)
POTASSIUM: 3.9 mmol/L (ref 3.5–5.1)
SODIUM: 135 mmol/L (ref 135–145)

## 2015-07-02 LAB — CBC
HCT: 33.1 % — ABNORMAL LOW (ref 39.0–52.0)
Hemoglobin: 11 g/dL — ABNORMAL LOW (ref 13.0–17.0)
MCH: 30.1 pg (ref 26.0–34.0)
MCHC: 33.2 g/dL (ref 30.0–36.0)
MCV: 90.7 fL (ref 78.0–100.0)
PLATELETS: 169 10*3/uL (ref 150–400)
RBC: 3.65 MIL/uL — AB (ref 4.22–5.81)
RDW: 13.1 % (ref 11.5–15.5)
WBC: 8.6 10*3/uL (ref 4.0–10.5)

## 2015-07-02 MED ORDER — BISACODYL 10 MG RE SUPP
10.0000 mg | Freq: Once | RECTAL | Status: AC
  Administered 2015-07-02: 10 mg via RECTAL
  Filled 2015-07-02: qty 1

## 2015-07-02 MED ORDER — OXYCODONE HCL 5 MG PO TABS
15.0000 mg | ORAL_TABLET | ORAL | Status: DC | PRN
  Administered 2015-07-02: 15 mg via ORAL
  Filled 2015-07-02: qty 3

## 2015-07-02 MED ORDER — POLYETHYLENE GLYCOL 3350 17 G PO PACK
17.0000 g | PACK | Freq: Every day | ORAL | Status: DC
  Administered 2015-07-02: 17 g via ORAL
  Filled 2015-07-02: qty 1

## 2015-07-02 MED ORDER — OXYCODONE HCL 5 MG PO TABS: 5.0000 mg | ORAL_TABLET | ORAL | Status: DC | PRN

## 2015-07-02 MED ORDER — OXYCODONE HCL 15 MG PO TABS: 15.0000 mg | ORAL_TABLET | ORAL | Status: DC | PRN

## 2015-07-02 MED ORDER — SENNA 8.6 MG PO TABS
2.0000 | ORAL_TABLET | Freq: Every day | ORAL | Status: DC
  Administered 2015-07-02: 17.2 mg via ORAL
  Filled 2015-07-02: qty 2

## 2015-07-02 MED ORDER — DIAZEPAM 10 MG PO TABS
10.0000 mg | ORAL_TABLET | Freq: Two times a day (BID) | ORAL | Status: DC | PRN
Start: 2015-07-02 — End: 2015-07-02

## 2015-07-02 MED ORDER — DIAZEPAM 10 MG PO TABS: 10.0000 mg | ORAL_TABLET | Freq: Two times a day (BID) | ORAL | Status: DC | PRN

## 2015-07-02 NOTE — Discharge Summary (Signed)
Date of admission: 06/30/2015  Date of discharge: 07/02/2015  Admission diagnosis: renal mass  Discharge diagnosis: Same  Secondary diagnoses: none  History and Physical: For full details, please see admission history and physical. Briefly, Joseph May is a 54 y.o. year old patient with with a left renal mass here for lap nephrectomy.   Hospital Course: The patient underwent  Lap nephrectomy on 06/30/15. The surgery was uneventful and he was transferred to the floor post operatively. Once on the floor his diet was advanced, his foley was removed POD#1 and he voided spontaneously. He was taking narcotic pain medications prior to surgery and so he had a a high narcotic tolerance and pain control was an issue. He stayed until POD#2 when his pain was under control with oral pain medications.   Laboratory values:  Recent Labs  06/30/15 1720 07/01/15 0511 07/02/15 0820  HGB 11.6* 11.2* 11.0*  HCT 35.0* 33.6* 33.1*    Recent Labs  07/01/15 0511 07/02/15 0820  CREATININE 1.49* 1.70*    Disposition: Home  Discharge instruction: The patient was instructed to be ambulatory but told to refrain from heavy lifting, strenuous activity, or driving.   Discharge medications:    Medication List    STOP taking these medications        HYDROcodone-acetaminophen 7.5-325 MG tablet  Commonly known as:  NORCO     oxycodone 5 MG capsule  Commonly known as:  OXY-IR  Replaced by:  oxyCODONE 15 MG immediate release tablet     traMADol 50 MG tablet  Commonly known as:  ULTRAM      TAKE these medications        acetaminophen 500 MG tablet  Commonly known as:  TYLENOL  Take 2 tablets (1,000 mg total) by mouth every 6 (six) hours as needed for mild pain or moderate pain.     diazepam 10 MG tablet  Commonly known as:  VALIUM  Take 1 tablet (10 mg total) by mouth every 12 (twelve) hours as needed.     hydrocortisone 25 MG suppository  Commonly known as:  ANUSOL-HC  Place 1 suppository (25  mg total) rectally 2 (two) times daily.     hydrocortisone-pramoxine rectal foam  Commonly known as:  PROCTOFOAM-HC  Place 1 applicator rectally 2 (two) times daily.     multivitamin with minerals Tabs tablet  Take 1 tablet by mouth daily.     oxyCODONE 15 MG immediate release tablet  Commonly known as:  ROXICODONE  Take 1 tablet (15 mg total) by mouth every 4 (four) hours as needed.        Followup:      Follow-up Information    Follow up with Ardis Hughs, MD On 07/18/2015.   Specialty:  Urology   Why:  12:45pm   Contact information:   [place] [address] [phone]

## 2015-07-12 NOTE — Progress Notes (Signed)
  PATHOLOGY REPORT FINAL DIAGNOSIS Diagnosis Kidney, radical nephrectomy for tumor, left CLEAR CELL RENAL CELL CARCINOMA, WHO/ISUP NUCLEAR GRADE 4 (3.6 CM) THE TUMOR INVADES RENAL VEIN AND PERIRENAL FAT URETER, VASCULAR AND ALL SURGICAL MARGINS ARE NEGATIVE FOR TUMOR

## 2015-08-03 ENCOUNTER — Ambulatory Visit (INDEPENDENT_AMBULATORY_CARE_PROVIDER_SITE_OTHER): Payer: BLUE CROSS/BLUE SHIELD | Admitting: Primary Care

## 2015-08-03 VITALS — BP 146/82 | HR 64 | Temp 97.9°F | Ht 73.0 in | Wt 189.8 lb

## 2015-08-03 DIAGNOSIS — M791 Myalgia, unspecified site: Secondary | ICD-10-CM

## 2015-08-03 LAB — BASIC METABOLIC PANEL
BUN: 14 mg/dL (ref 6–23)
CALCIUM: 9.7 mg/dL (ref 8.4–10.5)
CO2: 30 meq/L (ref 19–32)
Chloride: 100 mEq/L (ref 96–112)
Creatinine, Ser: 1.44 mg/dL (ref 0.40–1.50)
GFR: 54.77 mL/min — AB (ref >60.00)
GLUCOSE: 108 mg/dL — AB (ref 70–99)
POTASSIUM: 4.1 meq/L (ref 3.5–5.1)
Sodium: 139 mEq/L (ref 135–145)

## 2015-08-03 MED ORDER — CYCLOBENZAPRINE HCL 5 MG PO TABS: 5.0000 mg | ORAL_TABLET | Freq: Every evening | ORAL | Status: DC | PRN

## 2015-08-03 NOTE — Patient Instructions (Addendum)
You may take Cyclobenzaprine (Flexeril) at bedtime as needed for muscle spasms/pain.   Complete lab work prior to leaving today. I will notify you of your results.  Use handout to exercise your muscles and advance your activity slowly.  Please call me if no improvement in 2 weeks.  It was a pleasure to see you today!

## 2015-08-03 NOTE — Progress Notes (Signed)
Subjective:    Patient ID: Joseph May, male    DOB: 07-04-1961, 54 y.o.   MRN: [id number]  HPI  Joseph May is a 54 year old male who presents today with a chief complaint of lower extremity pain. His pain is located to the right lower extremity. He initially experienced pain to the anterior and posterior aspect of his left thigh after surgery in October 2016.  His left lower extremity eventually resolved.   About 2 weeks ago he's developed right lower extremity pain with radiation down to his toes. He's taken ibuprofen, applied heat/ice, aspracream with some improvement temporarily. He's started back to work and has been working half days for the previous weeks, mainly sitting, some standing. He describes his pain as sore mostly with some sharp, shooting pain. He experiences most of his pain at night when trying to sleep. He had a post op follow up with his urologist on 07/18/15.   Review of Systems  Constitutional: Negative for fever and chills.  Musculoskeletal: Positive for myalgias. Negative for back pain.       Right lower extremity pain.       Past Medical History  Diagnosis Date  . Hemorrhoids   . Renal mass Sept 2016    Social History   Social History  . Marital Status: Married    Spouse Name: N/A  . Number of Children: N/A  . Years of Education: N/A   Occupational History  . Not on file.   Social History Main Topics  . Smoking status: Never Smoker   . Smokeless tobacco: Never Used  . Alcohol Use: Yes     Comment: 2-3 beers at night  . Drug Use: No  . Sexual Activity: Not on file   Other Topics Concern  . Not on file   Social History Narrative   Married.   Works as a Dance movement psychotherapist.   1 daughter.   Enjoys visiting family at the beach.        Past Surgical History  Procedure Laterality Date  . Neck surgery    . Cystoscopy with stent placement Left 06/07/2015    Procedure: CYSTOSCOPY WITH STENT PLACEMENT;  Surgeon: Ardis Hughs, MD;   Location: [facility] ORS;  Service: Urology;  Laterality: Left;  . Laparoscopic nephrectomy Left 06/30/2015    Procedure: LAPAROSCOPIC LEFT  NEPHRECTOMY;  Surgeon: Ardis Hughs, MD;  Location: [facility] ORS;  Service: Urology;  Laterality: Left;    Family History  Problem Relation Age of Onset  . Diabetes Mother   . Breast cancer Mother     Allergies  Allergen Reactions  . Dilaudid [Hydromorphone Hcl] Rash    Questionable if it was causative. He has tolerated doses since.    Current Outpatient Prescriptions on File Prior to Visit  Medication Sig Dispense Refill  . acetaminophen (TYLENOL) 500 MG tablet Take 2 tablets (1,000 mg total) by mouth every 6 (six) hours as needed for mild pain or moderate pain. 30 tablet 0  . Multiple Vitamin (MULTIVITAMIN WITH MINERALS) TABS tablet Take 1 tablet by mouth daily.     No current facility-administered medications on file prior to visit.    BP 146/82 mmHg  Pulse 64  Temp(Src) 97.9 F (36.6 C) (Oral)  Ht 6\' 1"  (1.854 m)  Wt 189 lb 12.8 oz (86.093 kg)  BMI 25.05 kg/m2  SpO2 98%    Objective:   Physical Exam  Constitutional: He appears well-nourished.  Cardiovascular: Normal rate and regular rhythm.  Pulmonary/Chest: Effort normal and breath sounds normal.  Musculoskeletal:  Decreased ROM with knee flexion, pain to popliteal fossa on right side. No instability to patella or PCL. Negative straight leg raise.  Skin: Skin is warm and dry.  No swelling, erythema, or tenderness  Psychiatric: He has a normal mood and affect.          Assessment & Plan:  Lower extremity pain:  Located to right hamstring area and poplitea fossa. Suspect MSK origin, no joint instability, non tender. Negative straight leg raise. Pain sounds like muscle strain from increased activity post op. Do not suspect blood embolus.  Handout with exercises provided. RX for flexeril HS for tightness or spasms. He is to notify us if no improvement in 2 weeks, will  consider PT at that point.

## 2015-08-03 NOTE — Progress Notes (Signed)
Pre visit review using our clinic review tool, if applicable. No additional management support is needed unless otherwise documented below in the visit note. 

## 2015-08-08 ENCOUNTER — Telehealth: Payer: Self-pay

## 2015-08-08 NOTE — Telephone Encounter (Signed)
Pt left v/m; pt seen 08/03/15 was given muscle relaxant for pain in lower rt leg; muscle relaxant is not helping and pt request different med to [facility].Please advise.

## 2015-08-08 NOTE — Telephone Encounter (Signed)
Anda Kraft wrote in her note that if muscle relaxer note effective, she wanted him to go to PT If he prefers to try something different, he will need to follow up because Anda Kraft is out of the office

## 2015-08-09 ENCOUNTER — Telehealth: Payer: Self-pay | Admitting: Oncology

## 2015-08-09 NOTE — Telephone Encounter (Signed)
Spoke with pt regarding new pt referral. Pt confirmed appt

## 2015-08-10 NOTE — Telephone Encounter (Signed)
Pt states he will give it more time and follow if necessary

## 2015-08-17 ENCOUNTER — Encounter: Payer: Self-pay | Admitting: Primary Care

## 2015-08-17 ENCOUNTER — Ambulatory Visit (INDEPENDENT_AMBULATORY_CARE_PROVIDER_SITE_OTHER): Payer: BLUE CROSS/BLUE SHIELD | Admitting: Primary Care

## 2015-08-17 VITALS — BP 146/82 | HR 101 | Temp 98.1°F | Ht 73.0 in | Wt 189.1 lb

## 2015-08-17 DIAGNOSIS — M792 Neuralgia and neuritis, unspecified: Secondary | ICD-10-CM | POA: Diagnosis not present

## 2015-08-17 MED ORDER — GABAPENTIN 100 MG PO CAPS: 100.0000 mg | ORAL_CAPSULE | Freq: Three times a day (TID) | ORAL | Status: DC

## 2015-08-17 NOTE — Progress Notes (Signed)
Pre visit review using our clinic review tool, if applicable. No additional management support is needed unless otherwise documented below in the visit note. 

## 2015-08-17 NOTE — Patient Instructions (Addendum)
Start Gabapentin 100 mg for nerve pain. Take 1 tablet by mouth three times daily as needed.   Please keep in close contact with me regarding how you're feeling. We may need to adjust the dose.  It was a pleasure to see you today! Happy Thanksgiving!

## 2015-08-17 NOTE — Progress Notes (Signed)
Subjective:    Patient ID: Joseph May, male    DOB: 1960/10/16, 54 y.o.   MRN: [id number]  HPI  Joseph May is a 54 year old male who presents today with a chief complaint of nerve pain. He was evaluated several weeks ago for a complaint of bilateral lower extremity muscle pain and pulling. He also noted subtle sharp, intermittent pain radiating down his right lower extremity. He had just started back to work during his last visit. He was provided with exercises and flexeril at bedtime for muscle aches and rest.   Since his last visit he continues to feel pain that starts from his right hamstring and radiates down to his right toes. He visited the chiropractor who believes his pain may be related to nerve damage from his recent surgery. His pain is worse at night. He's tried to support his legs with pillows at night which hasn't helped. He's tried heat, ice, compression socks, and several chiropractic session without relief.. He's taken tylenol, tylenol PM without much relief. He's currently going through PT at home post operatively and they note good strength.  Review of Systems  Constitutional: Negative for fever.  Musculoskeletal: Positive for myalgias. Negative for back pain.       Sharp pain radiating down right lower extremity.  Neurological: Positive for numbness.       Past Medical History  Diagnosis Date  . Hemorrhoids   . Renal mass Sept 2016    Social History   Social History  . Marital Status: Married    Spouse Name: N/A  . Number of Children: N/A  . Years of Education: N/A   Occupational History  . Not on file.   Social History Main Topics  . Smoking status: Never Smoker   . Smokeless tobacco: Never Used  . Alcohol Use: Yes     Comment: 2-3 beers at night  . Drug Use: No  . Sexual Activity: Not on file   Other Topics Concern  . Not on file   Social History Narrative   Married.   Works as a Dance movement psychotherapist.   1 daughter.   Enjoys visiting family at  the beach.        Past Surgical History  Procedure Laterality Date  . Neck surgery    . Cystoscopy with stent placement Left 06/07/2015    Procedure: CYSTOSCOPY WITH STENT PLACEMENT;  Surgeon: Ardis Hughs, MD;  Location: [facility];  Service: Urology;  Laterality: Left;  . Laparoscopic nephrectomy Left 06/30/2015    Procedure: LAPAROSCOPIC LEFT  NEPHRECTOMY;  Surgeon: Ardis Hughs, MD;  Location: [facility];  Service: Urology;  Laterality: Left;    Family History  Problem Relation Age of Onset  . Diabetes Mother   . Breast cancer Mother     Allergies  Allergen Reactions  . Dilaudid [Hydromorphone Hcl] Rash    Questionable if it was causative. He has tolerated doses since.    Current Outpatient Prescriptions on File Prior to Visit  Medication Sig Dispense Refill  . acetaminophen (TYLENOL) 500 MG tablet Take 2 tablets (1,000 mg total) by mouth every 6 (six) hours as needed for mild pain or moderate pain. 30 tablet 0  . Multiple Vitamin (MULTIVITAMIN WITH MINERALS) TABS tablet Take 1 tablet by mouth daily.    . cyclobenzaprine (FLEXERIL) 5 MG tablet Take 1 tablet (5 mg total) by mouth at bedtime as needed for muscle spasms. (Patient not taking: Reported on 08/17/2015) 15 tablet 0  No current facility-administered medications on file prior to visit.    BP 146/82 mmHg  Pulse 101  Temp(Src) 98.1 F (36.7 C) (Oral)  Ht 6\' 1"  (1.854 m)  Wt 189 lb 1.9 oz (85.784 kg)  BMI 24.96 kg/m2  SpO2 98%    Objective:   Physical Exam  Constitutional: He appears well-nourished.  Cardiovascular: Normal rate and regular rhythm.   Pulmonary/Chest: Effort normal and breath sounds normal.  Musculoskeletal:  Pain with straight leg raise (supine and sitting) to right lower extremity. Good strength. Decreased ROM to right lower extremity due to pain.  Skin: Skin is warm and dry.          Assessment & Plan:  Neuropathic Pain:  Located to right lower extremity x 4 weeks without  relief from supportive measures. Pain to right lower extremity with PROM to right lower extremity with extension and flexion. Suspect nerve damage from recent surgery.  Will treat with low dose gabapentin TID for now. He is to call me with an update in 2 weeks, may need to titrate. Drowsiness precautions provided.

## 2015-08-22 ENCOUNTER — Encounter: Payer: Self-pay | Admitting: Primary Care

## 2015-08-24 ENCOUNTER — Telehealth: Payer: Self-pay | Admitting: Oncology

## 2015-08-24 ENCOUNTER — Ambulatory Visit (HOSPITAL_BASED_OUTPATIENT_CLINIC_OR_DEPARTMENT_OTHER): Payer: BLUE CROSS/BLUE SHIELD | Admitting: Oncology

## 2015-08-24 VITALS — BP 149/82 | HR 86 | Temp 98.1°F | Resp 20 | Ht 73.0 in | Wt 192.0 lb

## 2015-08-24 DIAGNOSIS — C649 Malignant neoplasm of unspecified kidney, except renal pelvis: Secondary | ICD-10-CM

## 2015-08-24 DIAGNOSIS — C642 Malignant neoplasm of left kidney, except renal pelvis: Secondary | ICD-10-CM | POA: Diagnosis not present

## 2015-08-24 DIAGNOSIS — G622 Polyneuropathy due to other toxic agents: Secondary | ICD-10-CM | POA: Diagnosis not present

## 2015-08-24 NOTE — Telephone Encounter (Signed)
per pof to sch pt appt-gave pt copy of avs °

## 2015-08-24 NOTE — Consult Note (Signed)
Reason for Referral: Kidney cancer.   HPI: Joseph May is a 54 year old gentleman who is otherwise healthy for the majority of his life. He did have a neck operation in the past but have not really had any chronic medical illnesses. He presented with flank pain after a period of time predominantly on the left side. While he was vacationing in [place], his pain was intense that subsequently was evaluated in the emergency department and a CT scan obtained at that time showed a left kidney tumor. On 06/06/2015 he underwent MRI of the abdomen and pelvis which showed 5.2 x 3.8 cm left renal mass. No evidence of lymphadenopathy or metastatic disease noted. Patient was evaluated by Dr. Louis Meckel and underwent a left ureteral stent on 06/07/2015. He subsequently underwent laparoscopic left radical nephrectomy on 06/30/2015. He tolerated the procedure well and was discharged on 07/02/2015. The pathology showed clear cell carcinoma with Fuhrman grade 4. No sarcomatoid features noted. No renal invasion or margins involved. Patient recovered well but did develop neuropathy in his right lower extremity and takes gabapentin for that. He resumed all his work-related duties and feels relatively back to normal. Has not reported any hematuria or flank pain at this time.  He does not report any headaches, blurry vision, syncope or seizures. He has not reported any fevers, chills or sweats. Does not report any cough, hemoptysis or wheezing. Desirable any chest pain, palpitation, orthopnea or leg edema. Does not report any nausea, vomiting, diarrhea, constipation or change in his bowel habits. He does not report any frequency, urgency or hesitancy. Does not report any skeletal complaints of arthralgias or myalgias. Remaining review of systems unremarkable.   Past Medical History  Diagnosis Date  . Hemorrhoids   . Renal mass Sept 2016  :  Past Surgical History  Procedure Laterality Date  . Neck surgery    . Cystoscopy with  stent placement Left 06/07/2015    Procedure: CYSTOSCOPY WITH STENT PLACEMENT;  Surgeon: Ardis Hughs, MD;  Location: [facility];  Service: Urology;  Laterality: Left;  . Laparoscopic nephrectomy Left 06/30/2015    Procedure: LAPAROSCOPIC LEFT  NEPHRECTOMY;  Surgeon: Ardis Hughs, MD;  Location: [facility];  Service: Urology;  Laterality: Left;  :   Current outpatient prescriptions:  .  acetaminophen (TYLENOL) 500 MG tablet, Take 2 tablets (1,000 mg total) by mouth every 6 (six) hours as needed for mild pain or moderate pain., Disp: 30 tablet, Rfl: 0 .  gabapentin (NEURONTIN) 100 MG capsule, Take 1 capsule (100 mg total) by mouth 3 (three) times daily., Disp: 90 capsule, Rfl: 3 .  Multiple Vitamin (MULTIVITAMIN WITH MINERALS) TABS tablet, Take 1 tablet by mouth daily., Disp: , Rfl: :  No Active Allergies:  Family History  Problem Relation Age of Onset  . Diabetes Mother   . Breast cancer Mother   :  Social History   Social History  . Marital Status: Married    Spouse Name: N/A  . Number of Children: N/A  . Years of Education: N/A   Occupational History  . Not on file.   Social History Main Topics  . Smoking status: Never Smoker   . Smokeless tobacco: Never Used  . Alcohol Use: Yes     Comment: 2-3 beers at night  . Drug Use: No  . Sexual Activity: Not on file   Other Topics Concern  . Not on file   Social History Narrative   Married.   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   Enjoys visiting family at the beach.      :  Pertinent items are noted in HPI.  Exam: ECOG 0 Blood pressure 149/82, pulse 86, temperature 98.1 F (36.7 C), temperature source Oral, resp. rate 20, height 6\' 1"  (1.854 m), weight 192 lb (87.091 kg), SpO2 100 %. General appearance: alert and cooperative Head: Normocephalic, without obvious abnormality Nose: Nares normal. Septum midline. Mucosa normal. No drainage or sinus tenderness. Throat: lips, mucosa, and tongue normal; teeth  and gums normal Neck: no adenopathy Back: negative Resp: clear to auscultation bilaterally Chest wall: no tenderness Cardio: regular rate and rhythm, S1, S2 normal, no murmur, click, rub or gallop GI: soft, non-tender; bowel sounds normal; no masses,  no organomegaly Extremities: extremities normal, atraumatic, no cyanosis or edema Pulses: 2+ and symmetric Skin: Skin color, texture, turgor normal. No rashes or lesions Lymph nodes: Cervical, supraclavicular, and axillary nodes normal.  CBC    Component Value Date/Time   WBC 8.6 07/02/2015 0820   RBC 3.65* 07/02/2015 0820   HGB 11.0* 07/02/2015 0820   HCT 33.1* 07/02/2015 0820   PLT 169 07/02/2015 0820   MCV 90.7 07/02/2015 0820   MCH 30.1 07/02/2015 0820   MCHC 33.2 07/02/2015 0820   RDW 13.1 07/02/2015 0820      Chemistry      Component Value Date/Time   NA 139 08/03/2015 1516   K 4.1 08/03/2015 1516   CL 100 08/03/2015 1516   CO2 30 08/03/2015 1516   BUN 14 08/03/2015 1516   CREATININE 1.44 08/03/2015 1516      Component Value Date/Time   CALCIUM 9.7 08/03/2015 1516   ALKPHOS 69 06/07/2015 0413   AST 21 06/07/2015 0413   ALT 17 06/07/2015 0413   BILITOT 0.7 06/07/2015 0413       Assessment and Plan:   54 year old gentleman with the following issues:  1. Renal cell carcinoma diagnosed in September 2016 after presenting with a large left kidney mass. He is status post left radical nephrectomy on 06/30/2015. The pathology revealed a 3.6 cm clear cell histology at T3a N0. He did not have any sarcomatoid features. He recovered well from the operation without any major complications.  The natural course of this disease was discussed with the patient as well as treatment options at this time. The standard of care treatment at this time would be observation and surveillance as there is no approved adjuvant therapy at this time. Multiple clinical trials have been done to test drugs approved in the metastatic setting have  been tested in the adjuvant setting without any significant improvement.  However, recent data from a Sutent adjuvant trial (S-TRAC) showed improvement in progression free survival after utilizing 50 mg dose, 4 weeks on and 2 weeks off for 1 year. The progression free survival and the study group 06.8 years compared to 5.6 years in the placebo group. Therefore portions of patients that are disease-free at 3 years with 64% versus 59% in the study versus a placebo arm. Overall survival data is not available at this time.  These date of surgery with the patient with the caveat that this medication has not been approved for this indication. Complications associated with this medicine will also reviewed extensively. He is alert include nausea, vomiting, myelosuppression, fatigue, tiredness, elevation in transaminases, hand-foot syndrome, hypertension, hypothyroidism among others.  After discussion today, the plan is to await his staging workup which is scheduled for January 2017 and also allow him to discuss  this further with his family and think about it at this time. He will let me know with his next visit in January 2017.  All his questions answered to his satisfaction.  2. Peripheral neuropathy: Likely will be affected by his treatment at this time.  3. Follow-up: Will be in January after repeat imaging studies which were rescheduled by Dr. Louis Meckel.

## 2015-08-24 NOTE — Progress Notes (Signed)
Please see consult note.  

## 2015-08-29 ENCOUNTER — Encounter: Payer: Self-pay | Admitting: Primary Care

## 2015-08-29 ENCOUNTER — Other Ambulatory Visit: Payer: Self-pay | Admitting: Primary Care

## 2015-08-29 DIAGNOSIS — M792 Neuralgia and neuritis, unspecified: Secondary | ICD-10-CM

## 2015-08-29 MED ORDER — GABAPENTIN 300 MG PO CAPS: 300.0000 mg | ORAL_CAPSULE | Freq: Three times a day (TID) | ORAL | Status: DC

## 2015-08-31 ENCOUNTER — Other Ambulatory Visit: Payer: Self-pay | Admitting: Urology

## 2015-08-31 DIAGNOSIS — C642 Malignant neoplasm of left kidney, except renal pelvis: Secondary | ICD-10-CM

## 2015-09-16 ENCOUNTER — Encounter: Payer: Self-pay | Admitting: Primary Care

## 2015-09-16 ENCOUNTER — Other Ambulatory Visit: Payer: Self-pay | Admitting: Primary Care

## 2015-09-16 DIAGNOSIS — R202 Paresthesia of skin: Principal | ICD-10-CM

## 2015-09-16 DIAGNOSIS — R200 Anesthesia of skin: Secondary | ICD-10-CM

## 2015-09-27 ENCOUNTER — Ambulatory Visit
Admission: RE | Admit: 2015-09-27 | Discharge: 2015-09-27 | Disposition: A | Discharge status: Home / Self Care | Payer: BLUE CROSS/BLUE SHIELD | Source: Ambulatory Visit | Attending: Urology | Admitting: Urology

## 2015-09-27 DIAGNOSIS — C642 Malignant neoplasm of left kidney, except renal pelvis: Secondary | ICD-10-CM

## 2015-09-27 DIAGNOSIS — Q279 Congenital malformation of peripheral vascular system, unspecified: Secondary | ICD-10-CM | POA: Diagnosis not present

## 2015-09-27 DIAGNOSIS — Z905 Acquired absence of kidney: Secondary | ICD-10-CM | POA: Insufficient documentation

## 2015-09-27 DIAGNOSIS — R918 Other nonspecific abnormal finding of lung field: Secondary | ICD-10-CM | POA: Diagnosis not present

## 2015-09-27 DIAGNOSIS — Z0189 Encounter for other specified special examinations: Secondary | ICD-10-CM | POA: Diagnosis present

## 2015-09-27 MED ORDER — IOHEXOL 350 MG/ML SOLN
100.0000 mL | Freq: Once | INTRAVENOUS | Status: AC | PRN
  Administered 2015-09-27: 85 mL via INTRAVENOUS

## 2015-10-04 ENCOUNTER — Ambulatory Visit (HOSPITAL_BASED_OUTPATIENT_CLINIC_OR_DEPARTMENT_OTHER): Payer: BLUE CROSS/BLUE SHIELD | Admitting: Oncology

## 2015-10-04 ENCOUNTER — Telehealth: Payer: Self-pay | Admitting: Oncology

## 2015-10-04 VITALS — BP 136/71 | HR 75 | Temp 98.4°F | Resp 18 | Ht 73.0 in | Wt 204.1 lb

## 2015-10-04 DIAGNOSIS — C649 Malignant neoplasm of unspecified kidney, except renal pelvis: Secondary | ICD-10-CM | POA: Diagnosis not present

## 2015-10-04 DIAGNOSIS — R911 Solitary pulmonary nodule: Secondary | ICD-10-CM

## 2015-10-04 NOTE — Telephone Encounter (Signed)
Talked to patient here in office. Scheduled appt.       [facility]. °

## 2015-10-04 NOTE — Progress Notes (Signed)
Hematology and Oncology Follow Up Visit  WEIR CROFF [id number] 10/25/1960 55 y.o. 10/04/2015 3:30 PM Joseph May, MDClark, Belenda Cruise, MD   Principle Diagnosis: 55 year old with renal cell carcinoma diagnosed in September 2016. He was found to have a large left kidney mass. The pathology revealed clear cell histology T3a N0. Imaging study did not show any evidence of metastatic disease.   Prior Therapy: He is status post laparoscopic left radical nephrectomy done on 06/30/2015. The pathology revealed clear cell histology nuclear grade 4 measuring 3.6 cm. The final pathological staging T3aNX.  Current therapy: Observation and surveillance.  Interim History: Joseph May presents today for a follow-up visit. Since his last visit, he reports no new complaints. He continues to have right leg pain which is chronic in nature and associated with neuropathy. Despite that, he resumed all activities of daily living. He works full time and have not had any decline in his performance status or activity level. He denied any respiratory symptoms of cough or shortness of breath. Has not reported any hematuria or dysuria. Has not reported any flank pain.  He does not report any headaches, blurry vision, syncope or seizures. He has not reported any fevers, chills or sweats. Does not report any cough, hemoptysis or wheezing. Desirable any chest pain, palpitation, orthopnea or leg edema. Does not report any nausea, vomiting, diarrhea, constipation or change in his bowel habits. He does not report any frequency, urgency or hesitancy. Does not report any skeletal complaints of arthralgias or myalgias. Remaining review of systems unremarkable.    Medications: I have reviewed the patient's current medications.  Current Outpatient Prescriptions  Medication Sig Dispense Refill  . acetaminophen (TYLENOL) 500 MG tablet Take 2 tablets (1,000 mg total) by mouth every 6 (six) hours as needed for mild pain or moderate pain.  30 tablet 0  . Multiple Vitamin (MULTIVITAMIN WITH MINERALS) TABS tablet Take 1 tablet by mouth daily.     No current facility-administered medications for this visit.     Allergies: No Known Allergies  Past Medical History, Surgical history, Social history, and Family History were reviewed and updated.   Physical Exam: Blood pressure 136/71, pulse 75, temperature 98.4 F (36.9 C), temperature source Oral, resp. rate 18, height 6\' 1"  (1.854 m), weight 204 lb 1.6 oz (92.579 kg), SpO2 100 %. ECOG: 0 General appearance: alert and cooperative Head: Normocephalic, without obvious abnormality Neck: no adenopathy Lymph nodes: Cervical, supraclavicular, and axillary nodes normal. Heart:regular rate and rhythm, S1, S2 normal, no murmur, click, rub or gallop Lung:chest clear, no wheezing, rales, normal symmetric air entry Abdomin: soft, non-tender, without masses or organomegaly EXT:no erythema, induration, or nodules   Lab Results: Lab Results  Component Value Date   WBC 8.6 07/02/2015   HGB 11.0* 07/02/2015   HCT 33.1* 07/02/2015   MCV 90.7 07/02/2015   PLT 169 07/02/2015     Chemistry      Component Value Date/Time   NA 139 08/03/2015 1516   K 4.1 08/03/2015 1516   CL 100 08/03/2015 1516   CO2 30 08/03/2015 1516   BUN 14 08/03/2015 1516   CREATININE 1.44 08/03/2015 1516      Component Value Date/Time   CALCIUM 9.7 08/03/2015 1516   ALKPHOS 69 06/07/2015 0413   AST 21 06/07/2015 0413   ALT 17 06/07/2015 0413   BILITOT 0.7 06/07/2015 0413       Radiological Studies:  CLINICAL DATA: History of renal cell carcinoma with left nephrectomy 06/30/2015. Right-sided leg and foot  tingling/ pain for 2 months.  EXAM: CT CHEST WITH CONTRAST  CT ABDOMEN WITHOUT AND WITH CONTRAST  CT PELVIS WITH CONTRAST  TECHNIQUE: Multidetector CT imaging of the abdomen was performed without intravenous contrast. Multidetector CT imaging of the chest and abdomen pelvis was then  performed during bolus administration of intravenous contrast.  CONTRAST: 2mL OMNIPAQUE IOHEXOL 350 MG/ML SOLN  COMPARISON: MRI of the abdomen of 06/06/2015. Outside CT of 06/03/2015.  FINDINGS: CT CHEST WITH CONTRAST  Mediastinum/Nodes: No supraclavicular adenopathy. Normal heart size, without pericardial effusion. No central pulmonary embolism, on this non-dedicated study. No mediastinal or hilar adenopathy.  Lungs/Pleura: No pleural fluid. 3 mm right lower lobe pulmonary nodule on image 31/series 14. A subpleural left lower lobe 4 mm pulmonary nodule on image 40/series 14 was similar on 06/03/2015 abdominal study.  Musculoskeletal: No acute osseous abnormality.  CT ABDOMEN WITHOUT AND WITH CONTRAST, CT PELVIS WITH CONTRAST  Normal liver. Normal gallbladder, without biliary ductal dilatation.  Pancreas: Normal, without mass or ductal dilatation.  Spleen: Normal in size, without focal abnormality.  Adrenals/Urinary Tract: Normal adrenal glands. Left nephrectomy, without locally recurrent disease. Normal stable appearance of the right kidney, without mass or hydronephrosis. No hydroureter. Normal urinary bladder.  Stomach/Bowel: Normal stomach, without wall thickening. Scattered colonic diverticula. Normal terminal ileum and appendix. Normal small bowel.  Vascular/Lymphatic: Aortic atherosclerosis. Duplicated IVC. No abdominopelvic adenopathy.  Reproductive: Normal prostate.  Other: No significant free fluid. Edema about the left pelvic wall on image 141/series 5 could be postoperative.  Musculoskeletal: No acute osseous abnormality.  IMPRESSION: 1. Interval left nephrectomy, without evidence of recurrent or metastatic disease. 2. Tiny nonspecific bilateral pulmonary nodules. Recommend attention on follow-up. 3. Duplicated IVC, as before.  Impression and Plan:  55 year old gentleman with the following issues:  1. Renal cell carcinoma  diagnosed in September 2016 after presenting with a large left kidney mass. He is status post left radical nephrectomy on 06/30/2015. The pathology revealed a 3.6 cm clear cell histology at T3a N0. He did not have any sarcomatoid features. He recovered well from the operation without any major complications.  CT scan on 09/27/2015 was reviewed today and showed no evidence of metastatic disease.  Options of treatment were reviewed today which include observation versus adjuvant Sutent based on the results of the trial (S-TRAC). This study showed improvement in progression free survival but premature for overall survival.  Risks and benefits of observation versus treatment were reviewed and we have elected to continue with observation and surveillance only. Plan to repeat imaging studies in 3 months and every 6 months after that.  2. Peripheral neuropathy: Not cancer related at this time. He follows up with neurology.  3. Very small nonspecific pulmonary nodules: Follow-up CT scan will further determine these.  4. Follow-up: Will be in 3 months after repeat imaging studies.   [id number], MD 1/10/20173:30 PM

## 2015-10-05 ENCOUNTER — Encounter: Payer: Self-pay | Admitting: Primary Care

## 2015-10-06 ENCOUNTER — Other Ambulatory Visit: Payer: Self-pay | Admitting: Primary Care

## 2015-10-06 DIAGNOSIS — M792 Neuralgia and neuritis, unspecified: Secondary | ICD-10-CM

## 2015-10-06 MED ORDER — PREDNISONE 10 MG PO TABS: ORAL_TABLET | ORAL | Status: DC

## 2015-10-21 ENCOUNTER — Ambulatory Visit (INDEPENDENT_AMBULATORY_CARE_PROVIDER_SITE_OTHER): Payer: BLUE CROSS/BLUE SHIELD | Admitting: Neurology

## 2015-10-21 ENCOUNTER — Encounter: Payer: Self-pay | Admitting: Neurology

## 2015-10-21 VITALS — BP 130/80 | HR 107 | Ht 73.0 in | Wt 201.2 lb

## 2015-10-21 DIAGNOSIS — Z85528 Personal history of other malignant neoplasm of kidney: Secondary | ICD-10-CM

## 2015-10-21 DIAGNOSIS — R292 Abnormal reflex: Secondary | ICD-10-CM

## 2015-10-21 DIAGNOSIS — R258 Other abnormal involuntary movements: Secondary | ICD-10-CM

## 2015-10-21 DIAGNOSIS — M792 Neuralgia and neuritis, unspecified: Secondary | ICD-10-CM

## 2015-10-21 DIAGNOSIS — R252 Cramp and spasm: Secondary | ICD-10-CM

## 2015-10-21 MED ORDER — CYCLOBENZAPRINE HCL 5 MG PO TABS: 5.0000 mg | ORAL_TABLET | Freq: Two times a day (BID) | ORAL | Status: DC | PRN

## 2015-10-21 MED ORDER — NORTRIPTYLINE HCL 10 MG PO CAPS: 10.0000 mg | ORAL_CAPSULE | Freq: Every day | ORAL | Status: DC

## 2015-10-21 NOTE — Patient Instructions (Addendum)
1.  MRI lumbar spine 2.  Start flexeril 5mg  at bedtime 3.  Start nortriptyline 10mg  at bedtime

## 2015-10-21 NOTE — Progress Notes (Signed)
[facility] Clinic Note - Initial Visit   Date: 10/21/2015  Joseph May MRN: [id number] DOB: [birth year]   Dear Dr. [name]:  Thank you for your kind referral of Joseph May for consultation of bilateral leg paresthesias and pain. Although his history is well known to you, please allow [place] to reiterate it for the purpose of our medical record. The patient was accompanied to the clinic by self.   History of Present Illness: Joseph May is a 55 y.o. right-handed Caucasian male with renal cell carcinoma s/p left nephrectomy (05/2015) presenting for evaluation of right leg numbness and foot pain.    He underwent left nephrectomy on October 2016 and several weeks later as he tapered his pain medications, he began experiencing burning sensation over the left anterior thigh and then developed right thigh soreness, as if pulled his hamstring. This slowly improved around Thanksgiving, but has persistent and numbness down the side of his right leg and shooting pain in his right foot.  He tried seeing a chiropractor, TENS units, gabapentin, and prednisone with no relief.   He denies back pain, weakness, bowel/bladder incontinence.  He reports having 3-4 beers nightly about three times per week.  He has reduced his alcohol intake to two beers about twice per week.  Out-side paper records, electronic medical record, and images have been reviewed where available and summarized as:  CT abdomen and pelvis 09/27/2015:   1. Interval left nephrectomy, without evidence of recurrent or metastatic disease. 2. Tiny nonspecific bilateral pulmonary nodules. Recommend attention on follow-up. 3. Duplicated IVC, as before.  Past Medical History  Diagnosis Date  . Hemorrhoids   . Renal mass Sept 2016  . Cancer [facility])     [place] 06/30/2015    Past Surgical History  Procedure Laterality Date  . Neck surgery    . Cystoscopy with stent placement Left 06/07/2015    Procedure: CYSTOSCOPY WITH  STENT PLACEMENT;  Surgeon: Ardis Hughs, MD;  Location: [facility] ORS;  Service: Urology;  Laterality: Left;  . Laparoscopic nephrectomy Left 06/30/2015    Procedure: LAPAROSCOPIC LEFT  NEPHRECTOMY;  Surgeon: Ardis Hughs, MD;  Location: [facility] ORS;  Service: Urology;  Laterality: Left;     Medications:  Outpatient Encounter Prescriptions as of 10/21/2015  Medication Sig  . acetaminophen (TYLENOL) 500 MG tablet Take 2 tablets (1,000 mg total) by mouth every 6 (six) hours as needed for mild pain or moderate pain.  . Multiple Vitamin (MULTIVITAMIN WITH MINERALS) TABS tablet Take 1 tablet by mouth daily.  . [DISCONTINUED] predniSONE (DELTASONE) 10 MG tablet Take 3 tablets for 3 days, then 2 tablets for 3 days, then 1 tablets for 3 days.   No facility-administered encounter medications on file as of 10/21/2015.     Allergies: No Known Allergies  Family History: Family History  Problem Relation Age of Onset  . Diabetes Mother   . Breast cancer Mother   . Liver disease Father   . Alcohol abuse Father     Deceased 72  . Healthy Brother   . Healthy Daughter     Social History: Social History  Substance Use Topics  . Smoking status: Never Smoker   . Smokeless tobacco: Never Used  . Alcohol Use: 0.0 oz/week    0 Standard drinks or equivalent per week     Comment: 2-3 beers at night   Social History   Social History Narrative   Married.  Lives in a one story home.  Works as a Dance movement psychotherapist.   1 daughter.     Enjoys visiting family at the beach.        Review of Systems:  CONSTITUTIONAL: No fevers, chills, night sweats, or weight loss.   EYES: No visual changes or eye pain ENT: No hearing changes.  No history of nose bleeds.   RESPIRATORY: No cough, wheezing and shortness of breath.   CARDIOVASCULAR: Negative for chest pain, and palpitations.   GI: Negative for abdominal discomfort, blood in stools or black stools.  No recent change in bowel habits.   GU:  No  history of incontinence.   MUSCLOSKELETAL: No history of joint pain or swelling.  +myalgias.   SKIN: Negative for lesions, rash, and itching.   HEMATOLOGY/ONCOLOGY: Negative for prolonged bleeding, bruising easily, and swollen nodes.  No history of cancer.   ENDOCRINE: Negative for cold or heat intolerance, polydipsia or goiter.   PSYCH:  No depression or anxiety symptoms.   NEURO: As Above.   Vital Signs:  BP 130/80 mmHg  Pulse 107  Ht 6\' 1"  (1.854 m)  Wt 201 lb 3 oz (91.258 kg)  BMI 26.55 kg/m2  SpO2 98%   General Medical Exam:   General:  Well appearing, comfortable.   Eyes/ENT: see cranial nerve examination.   Neck: No masses appreciated.  Full range of motion without tenderness.  No carotid bruits.  Negative Lhermitte's sign. Respiratory:  Clear to auscultation, good air entry bilaterally.   Cardiac:  Regular rate and rhythm, no murmur.   Extremities:  No deformities, edema, or skin discoloration.  Skin:  No rashes or lesions.  Neurological Exam: MENTAL STATUS including orientation to time, place, person, recent and remote memory, attention span and concentration, language, and fund of knowledge is normal.  Speech is not dysarthric.  CRANIAL NERVES: II:  No visual field defects.  Unremarkable fundi.   III-IV-VI: Pupils equal round and reactive to light.  Normal conjugate, extra-ocular eye movements in all directions of gaze.  No nystagmus.  No ptosis.   V:  Normal facial sensation   VII:  Normal facial symmetry and movements.  No pathologic facial reflexes.  VIII:  Normal hearing and vestibular function.   IX-X:  Normal palatal movement.   XI:  Normal shoulder shrug and head rotation.   XII:  Normal tongue strength and range of motion, no deviation or fasciculation.  MOTOR:  No atrophy, fasciculations or abnormal movements.  No pronator drift.  Tone is normal.    Right Upper Extremity:    Left Upper Extremity:    Deltoid  5/5   Deltoid  5/5   Biceps  5/5   Biceps  5/5    Triceps  5/5   Triceps  5/5   Wrist extensors  5/5   Wrist extensors  5/5   Wrist flexors  5/5   Wrist flexors  5/5   Finger extensors  5/5   Finger extensors  5/5   Finger flexors  5/5   Finger flexors  5/5   Dorsal interossei  5/5   Dorsal interossei  5/5   Abductor pollicis  5/5   Abductor pollicis  5/5   Tone (Ashworth scale)  0  Tone (Ashworth scale)  0   Right Lower Extremity:    Left Lower Extremity:    Hip flexors  5/5   Hip flexors  5/5   Hip extensors  5/5   Hip extensors  5/5   Knee flexors  5/5   Knee  flexors  5/5   Knee extensors  5/5   Knee extensors  5/5   Dorsiflexors  5/5   Dorsiflexors  5/5   Plantarflexors  5/5   Plantarflexors  5/5   Toe extensors  5/5   Toe extensors  5/5   Toe flexors  5/5   Toe flexors  5/5   Tone (Ashworth scale)  1+  Tone (Ashworth scale)  1+   MSRs:  Right                                                                 Left brachioradialis 2+  brachioradialis 2+  biceps 2+  biceps 2+  triceps 2+  triceps 2+  patellar 3+  patellar 3+  ankle jerk 3+  ankle jerk 3+  Hoffman no  Hoffman no  plantar response down  plantar response down  Crossed adductors present bilaterally  SENSORY:  Reduced temperature, vibration, light touch on the right over the dorsum of the right foot, intact elsewhere. No sensory level.  COORDINATION/GAIT: Normal finger-to- nose-finger and heel-to-shin.  Intact rapid alternating movements bilaterally.  Able to rise from a chair without using arms.  Gait appears antalgic and spastic.  He is able to walk on toes, unable to walk on heels.  Unsteady with tandem gait.   IMPRESSION: Mr. Risper is a 55 year-old gentleman with history of renal cell carcinoma s/p resection (06/2015) referred for evaluation of right leg paresthesias and spasticity.  His exam shows generalized hyperreflexia, most notable in the legs, with prominent spasticity of the legs.  His sensory deficits follow the right L5 dermatome.  I am most concerned  about a structural lesion of spinal cord affecting the lumbar region and will order MRI lumbar spine with and without contrast.  Need to exclude metastatic lesion to the spinal cord.  If MRI lumbar spine does not disclose any abnormality, will need to look higher at the thoracic and/or cervical spine.    PLAN/RECOMMENDATIONS:  1.  MRI lumbar spine wwo contrast 2.  Start flexeril 5mg  at bedtime 3.  Start nortriptyline 10mg  at bedtime  Return to clinic in 2 months.   The duration of this appointment visit was 45 minutes of face-to-face time with the patient.  Greater than 50% of this time was spent in counseling, explanation of diagnosis, planning of further management, and coordination of care.   Thank you for allowing me to participate in patient's care.  If I can answer any additional questions, I would be pleased to do so.    Sincerely,    Aimie Wagman K. Posey Pronto, DO

## 2015-10-25 ENCOUNTER — Ambulatory Visit
Admission: RE | Admit: 2015-10-25 | Discharge: 2015-10-25 | Disposition: A | Discharge status: Home / Self Care | Payer: BLUE CROSS/BLUE SHIELD | Source: Ambulatory Visit | Attending: Neurology | Admitting: Neurology

## 2015-10-25 DIAGNOSIS — G544 Lumbosacral root disorders, not elsewhere classified: Secondary | ICD-10-CM | POA: Insufficient documentation

## 2015-10-25 DIAGNOSIS — R52 Pain, unspecified: Secondary | ICD-10-CM | POA: Insufficient documentation

## 2015-10-25 DIAGNOSIS — Z85528 Personal history of other malignant neoplasm of kidney: Secondary | ICD-10-CM

## 2015-10-25 DIAGNOSIS — R292 Abnormal reflex: Secondary | ICD-10-CM

## 2015-10-25 DIAGNOSIS — M792 Neuralgia and neuritis, unspecified: Secondary | ICD-10-CM

## 2015-10-25 DIAGNOSIS — M545 Low back pain: Secondary | ICD-10-CM | POA: Diagnosis present

## 2015-10-25 DIAGNOSIS — R258 Other abnormal involuntary movements: Secondary | ICD-10-CM | POA: Insufficient documentation

## 2015-10-25 DIAGNOSIS — R252 Cramp and spasm: Secondary | ICD-10-CM

## 2015-10-25 DIAGNOSIS — M5127 Other intervertebral disc displacement, lumbosacral region: Secondary | ICD-10-CM | POA: Diagnosis not present

## 2015-10-25 MED ORDER — GADOBENATE DIMEGLUMINE 529 MG/ML IV SOLN
20.0000 mL | Freq: Once | INTRAVENOUS | Status: AC | PRN
  Administered 2015-10-25: 19 mL via INTRAVENOUS

## 2015-10-28 ENCOUNTER — Telehealth: Payer: Self-pay | Admitting: Neurology

## 2015-10-28 NOTE — Telephone Encounter (Signed)
PT called and said he missed a call and thinks its about his MRI results/Dawn CB# [phone]

## 2015-10-28 NOTE — Telephone Encounter (Signed)
See next note

## 2015-11-01 ENCOUNTER — Ambulatory Visit: Payer: BLUE CROSS/BLUE SHIELD

## 2015-11-03 ENCOUNTER — Ambulatory Visit: Payer: BLUE CROSS/BLUE SHIELD | Admitting: Neurology

## 2015-12-30 ENCOUNTER — Ambulatory Visit: Admission: RE | Admit: 2015-12-30 | Payer: BLUE CROSS/BLUE SHIELD | Source: Ambulatory Visit

## 2015-12-30 ENCOUNTER — Ambulatory Visit
Admission: RE | Admit: 2015-12-30 | Discharge: 2015-12-30 | Disposition: A | Discharge status: Home / Self Care | Payer: BLUE CROSS/BLUE SHIELD | Source: Ambulatory Visit | Attending: Oncology | Admitting: Oncology

## 2015-12-30 DIAGNOSIS — Z85528 Personal history of other malignant neoplasm of kidney: Secondary | ICD-10-CM | POA: Diagnosis not present

## 2015-12-30 DIAGNOSIS — Z905 Acquired absence of kidney: Secondary | ICD-10-CM | POA: Insufficient documentation

## 2015-12-30 DIAGNOSIS — R911 Solitary pulmonary nodule: Secondary | ICD-10-CM | POA: Insufficient documentation

## 2015-12-30 DIAGNOSIS — C649 Malignant neoplasm of unspecified kidney, except renal pelvis: Secondary | ICD-10-CM | POA: Insufficient documentation

## 2015-12-30 LAB — POCT I-STAT CREATININE: CREATININE: 1.4 mg/dL — AB (ref 0.61–1.24)

## 2015-12-30 MED ORDER — IOPAMIDOL (ISOVUE-300) INJECTION 61%
100.0000 mL | Freq: Once | INTRAVENOUS | Status: AC | PRN
  Administered 2015-12-30: 100 mL via INTRAVENOUS

## 2016-01-03 DIAGNOSIS — Z0000 Encounter for general adult medical examination without abnormal findings: Secondary | ICD-10-CM | POA: Diagnosis not present

## 2016-01-03 DIAGNOSIS — C642 Malignant neoplasm of left kidney, except renal pelvis: Secondary | ICD-10-CM | POA: Diagnosis not present

## 2016-01-04 ENCOUNTER — Other Ambulatory Visit (HOSPITAL_BASED_OUTPATIENT_CLINIC_OR_DEPARTMENT_OTHER): Payer: BLUE CROSS/BLUE SHIELD

## 2016-01-04 ENCOUNTER — Ambulatory Visit (HOSPITAL_BASED_OUTPATIENT_CLINIC_OR_DEPARTMENT_OTHER): Payer: BLUE CROSS/BLUE SHIELD | Admitting: Oncology

## 2016-01-04 ENCOUNTER — Telehealth: Payer: Self-pay | Admitting: Oncology

## 2016-01-04 VITALS — BP 137/75 | HR 73 | Temp 98.0°F | Resp 18 | Wt 200.9 lb

## 2016-01-04 DIAGNOSIS — C642 Malignant neoplasm of left kidney, except renal pelvis: Secondary | ICD-10-CM

## 2016-01-04 DIAGNOSIS — R911 Solitary pulmonary nodule: Secondary | ICD-10-CM | POA: Diagnosis not present

## 2016-01-04 DIAGNOSIS — C649 Malignant neoplasm of unspecified kidney, except renal pelvis: Secondary | ICD-10-CM

## 2016-01-04 DIAGNOSIS — G622 Polyneuropathy due to other toxic agents: Secondary | ICD-10-CM | POA: Diagnosis not present

## 2016-01-04 LAB — COMPREHENSIVE METABOLIC PANEL
ALK PHOS: 74 U/L (ref 40–150)
ALT: 15 U/L (ref 0–55)
ANION GAP: 8 meq/L (ref 3–11)
AST: 16 U/L (ref 5–34)
Albumin: 3.8 g/dL (ref 3.5–5.0)
BUN: 17.6 mg/dL (ref 7.0–26.0)
CO2: 28 meq/L (ref 22–29)
Calcium: 8.9 mg/dL (ref 8.4–10.4)
Chloride: 103 mEq/L (ref 98–109)
Creatinine: 1.6 mg/dL — ABNORMAL HIGH (ref 0.7–1.3)
EGFR: 48 mL/min/{1.73_m2} — ABNORMAL LOW (ref >90)
GLUCOSE: 147 mg/dL — AB (ref 70–140)
POTASSIUM: 4.1 meq/L (ref 3.5–5.1)
SODIUM: 139 meq/L (ref 136–145)
Total Bilirubin: 0.53 mg/dL (ref 0.20–1.20)
Total Protein: 7 g/dL (ref 6.4–8.3)

## 2016-01-04 LAB — CBC WITH DIFFERENTIAL/PLATELET
BASO%: 0.7 % (ref 0.0–2.0)
BASOS ABS: 0 10*3/uL (ref 0.0–0.1)
EOS ABS: 0.1 10*3/uL (ref 0.0–0.5)
EOS%: 2.9 % (ref 0.0–7.0)
HCT: 39.8 % (ref 38.4–49.9)
HGB: 13.4 g/dL (ref 13.0–17.1)
LYMPH%: 21.3 % (ref 14.0–49.0)
MCH: 30.1 pg (ref 27.2–33.4)
MCHC: 33.7 g/dL (ref 32.0–36.0)
MCV: 89.2 fL (ref 79.3–98.0)
MONO#: 0.4 10*3/uL (ref 0.1–0.9)
MONO%: 8.5 % (ref 0.0–14.0)
NEUT#: 3 10*3/uL (ref 1.5–6.5)
NEUT%: 66.6 % (ref 39.0–75.0)
PLATELETS: 222 10*3/uL (ref 140–400)
RBC: 4.46 10*6/uL (ref 4.20–5.82)
RDW: 13.3 % (ref 11.0–14.6)
WBC: 4.5 10*3/uL (ref 4.0–10.3)
lymph#: 1 10*3/uL (ref 0.9–3.3)

## 2016-01-04 NOTE — Telephone Encounter (Signed)
per pof to sch pt appt-gave pt copy of avs-pt having CT @ [facility] all to sch

## 2016-01-04 NOTE — Progress Notes (Signed)
Hematology and Oncology Follow Up Visit  Joseph May [id number] [birth year] 55 y.o. 01/04/2016 3:12 PM Joseph May, MDClark, Belenda Cruise, MD   Principle Diagnosis: 55 year old with renal cell carcinoma diagnosed in September 2016. He was found to have a large left kidney mass. The pathology revealed clear cell histology T3a N0. Imaging study did not show any evidence of metastatic disease.   Prior Therapy: He is status post laparoscopic left radical nephrectomy done on 06/30/2015. The pathology revealed clear cell histology nuclear grade 4 measuring 3.6 cm. The final pathological staging T3aNX.  Current therapy: Observation and surveillance.  Interim History: Joseph May presents today for a follow-up visit. Since his last visit, he continues to do very well. He has resumed work-related duties and currently works close to 50 hours a week. He continues to have right leg pain which is neuropathic in nature although it is improving.  He denied any respiratory symptoms of cough or shortness of breath. Has not reported any hematuria or dysuria. Has not reported any flank pain. He does not report any bone pain or arthralgias.  He does not report any headaches, blurry vision, syncope or seizures. He has not reported any fevers, chills or sweats. Does not report any cough, hemoptysis or wheezing. Desirable any chest pain, palpitation, orthopnea or leg edema. Does not report any nausea, vomiting, diarrhea, constipation or change in his bowel habits. He does not report any frequency, urgency or hesitancy. Does not report any skeletal complaints of arthralgias or myalgias. Remaining review of systems unremarkable.    Medications: I have reviewed the patient's current medications.  Current Outpatient Prescriptions  Medication Sig Dispense Refill  . acetaminophen (TYLENOL) 500 MG tablet Take 2 tablets (1,000 mg total) by mouth every 6 (six) hours as needed for mild pain or moderate pain. 30 tablet 0  .  Multiple Vitamin (MULTIVITAMIN WITH MINERALS) TABS tablet Take 1 tablet by mouth daily.     No current facility-administered medications for this visit.     Allergies: No Known Allergies  Past Medical History, Surgical history, Social history, and Family History were reviewed and updated.   Physical Exam: Blood pressure 137/75, pulse 73, temperature 98 F (36.7 C), temperature source Oral, resp. rate 18, weight 200 lb 14.4 oz (91.128 kg), SpO2 100 %. ECOG: 0 General appearance: Alert, awake gentleman without distress. Head: Normocephalic, without obvious abnormality no oral ulcers or lesions. Neck: no adenopathy Lymph nodes: Cervical, supraclavicular, and axillary nodes normal. Heart:regular rate and rhythm, S1, S2 normal, no murmur, click, rub or gallop Lung:chest clear, no wheezing, rales, normal symmetric air entry Abdomin: soft, non-tender, without masses or organomegaly EXT:no erythema, induration, or nodules   Lab Results: Lab Results  Component Value Date   WBC 4.5 01/04/2016   HGB 13.4 01/04/2016   HCT 39.8 01/04/2016   MCV 89.2 01/04/2016   PLT 222 01/04/2016     Chemistry      Component Value Date/Time   NA 139 08/03/2015 1516   K 4.1 08/03/2015 1516   CL 100 08/03/2015 1516   CO2 30 08/03/2015 1516   BUN 14 08/03/2015 1516   CREATININE 1.40* 12/30/2015 1310      Component Value Date/Time   CALCIUM 9.7 08/03/2015 1516   ALKPHOS 69 06/07/2015 0413   AST 21 06/07/2015 0413   ALT 17 06/07/2015 0413   BILITOT 0.7 06/07/2015 0413     EXAM: CT CHEST, ABDOMEN, AND PELVIS WITH CONTRAST  TECHNIQUE: Multidetector CT imaging of the chest, abdomen and pelvis  was performed following the standard protocol during bolus administration of intravenous contrast.  CONTRAST: 17mL ISOVUE-300 IOPAMIDOL (ISOVUE-300) INJECTION 61%  COMPARISON: CT chest abdomen pelvis dated 09/27/2015  FINDINGS: CT CHEST FINDINGS  Mediastinum/Nodes: The heart is normal in  size. No pericardial effusion.  No suspicious mediastinal lymphadenopathy.  Visualized thyroid is unremarkable.  Lungs/Pleura: 4 mm subpleural left lower lobe pulmonary nodule (series 4/image 113), unchanged.  3 mm calcified granuloma in the right lower lobe (series 4/image 86), benign.  No suspicious pulmonary nodules.  No focal consolidation.  No pleural effusion or pneumothorax.  Musculoskeletal: Mild degenerative changes of the thoracic spine.  CT ABDOMEN PELVIS FINDINGS  Hepatobiliary: Liver is notable for a 7 mm cyst in the medial segment left hepatic dome (series 2/image 53).  Gallbladder is unremarkable. No intrahepatic or extrahepatic ductal dilatation.  Pancreas: Within normal limits.  Spleen: Within normal limits.  Adrenals/Urinary Tract: Adrenal glands are within normal limits.  Status post left nephrectomy. No abnormal soft tissue in the surgical bed.  Right kidney is within normal limits. No hydronephrosis.  Bladder is within normal limits.  Stomach/Bowel: Stomach is within normal limits.  No evidence of bowel obstruction.  Normal appendix (series 2/ image 100).  Mild sigmoid diverticulosis, without evidence of diverticulitis  Vascular/Lymphatic: No evidence of abdominal aortic aneurysm. Duplicated left IVC.  No suspicious abdominopelvic lymphadenopathy.  Reproductive: Prostate is unremarkable.  Other: No abdominopelvic ascites.  Musculoskeletal: Degenerative changes of the lumbar spine.  IMPRESSION: Status post left nephrectomy.  No evidence of recurrent or metastatic disease.  Stable 4 mm subpleural left lower lobe pulmonary nodule, likely benign.     Impression and Plan:  55 year old gentleman with the following issues:  1. Renal cell carcinoma diagnosed in September 2016 after presenting with a large left kidney mass. He is status post left radical nephrectomy on 06/30/2015. The pathology  revealed a 3.6 cm clear cell histology at T3a N0. He did not have any sarcomatoid features. He recovered well from the operation without any major complications.  CT scan on 12/30/2015 was reviewed today and showed no evidence of metastatic disease.  The plan is to continue with active surveillance with repeat imaging studies in 6 months. These will be done sooner if he develops any new symptoms.  2. Peripheral neuropathy: Not cancer related at this time. His leg pain seems to be improving at this time from that neuropathy standpoint.  3. Very small nonspecific pulmonary nodules: Unchanged on the current CT scan likely indicating benign etiology.  4. Follow-up: Will be in 3 months after repeat imaging studies.   Zola Button, MD 4/12/20173:12 PM

## 2016-01-05 ENCOUNTER — Telehealth: Payer: Self-pay | Admitting: *Deleted

## 2016-01-05 NOTE — Progress Notes (Signed)
Spoke with patient, let him know per dr Alen Blew, his blood work looks good and no major changes

## 2016-01-05 NOTE — Telephone Encounter (Signed)
-----   Message from Wyatt Portela, MD sent at 01/05/2016  8:08 AM EDT ----- Please let him know his blood work looks good. No major changes.

## 2016-01-17 ENCOUNTER — Encounter: Payer: Self-pay | Admitting: *Deleted

## 2016-05-02 DIAGNOSIS — D485 Neoplasm of uncertain behavior of skin: Secondary | ICD-10-CM | POA: Diagnosis not present

## 2016-05-02 DIAGNOSIS — D1800 Hemangioma unspecified site: Secondary | ICD-10-CM | POA: Diagnosis not present

## 2016-05-02 DIAGNOSIS — L821 Other seborrheic keratosis: Secondary | ICD-10-CM | POA: Diagnosis not present

## 2016-05-02 DIAGNOSIS — D229 Melanocytic nevi, unspecified: Secondary | ICD-10-CM | POA: Diagnosis not present

## 2016-05-02 DIAGNOSIS — D224 Melanocytic nevi of scalp and neck: Secondary | ICD-10-CM | POA: Diagnosis not present

## 2016-05-02 DIAGNOSIS — D239 Other benign neoplasm of skin, unspecified: Secondary | ICD-10-CM

## 2016-05-02 DIAGNOSIS — Z1283 Encounter for screening for malignant neoplasm of skin: Secondary | ICD-10-CM | POA: Diagnosis not present

## 2016-05-02 HISTORY — DX: Other benign neoplasm of skin, unspecified: D23.9

## 2016-06-08 ENCOUNTER — Ambulatory Visit
Admission: RE | Admit: 2016-06-08 | Discharge: 2016-06-08 | Disposition: A | Discharge status: Home / Self Care | Payer: BLUE CROSS/BLUE SHIELD | Source: Ambulatory Visit | Attending: Oncology | Admitting: Oncology

## 2016-06-08 DIAGNOSIS — R911 Solitary pulmonary nodule: Secondary | ICD-10-CM | POA: Diagnosis not present

## 2016-06-08 DIAGNOSIS — C799 Secondary malignant neoplasm of unspecified site: Secondary | ICD-10-CM | POA: Insufficient documentation

## 2016-06-08 DIAGNOSIS — C649 Malignant neoplasm of unspecified kidney, except renal pelvis: Secondary | ICD-10-CM

## 2016-06-08 DIAGNOSIS — Z905 Acquired absence of kidney: Secondary | ICD-10-CM | POA: Insufficient documentation

## 2016-06-08 DIAGNOSIS — Z85528 Personal history of other malignant neoplasm of kidney: Secondary | ICD-10-CM | POA: Diagnosis not present

## 2016-06-08 LAB — POCT I-STAT CREATININE: Creatinine, Ser: 1.4 mg/dL — ABNORMAL HIGH (ref 0.61–1.24)

## 2016-06-08 MED ORDER — IOPAMIDOL (ISOVUE-300) INJECTION 61%
100.0000 mL | Freq: Once | INTRAVENOUS | Status: AC | PRN
  Administered 2016-06-08: 100 mL via INTRAVENOUS

## 2016-07-02 ENCOUNTER — Other Ambulatory Visit (HOSPITAL_BASED_OUTPATIENT_CLINIC_OR_DEPARTMENT_OTHER): Payer: BLUE CROSS/BLUE SHIELD

## 2016-07-02 DIAGNOSIS — C642 Malignant neoplasm of left kidney, except renal pelvis: Secondary | ICD-10-CM

## 2016-07-02 DIAGNOSIS — C649 Malignant neoplasm of unspecified kidney, except renal pelvis: Secondary | ICD-10-CM

## 2016-07-02 LAB — COMPREHENSIVE METABOLIC PANEL
ALT: 16 U/L (ref 0–55)
ANION GAP: 11 meq/L (ref 3–11)
AST: 15 U/L (ref 5–34)
Albumin: 3.9 g/dL (ref 3.5–5.0)
Alkaline Phosphatase: 80 U/L (ref 40–150)
BUN: 16.3 mg/dL (ref 7.0–26.0)
CALCIUM: 9.3 mg/dL (ref 8.4–10.4)
CHLORIDE: 106 meq/L (ref 98–109)
CO2: 24 meq/L (ref 22–29)
Creatinine: 1.4 mg/dL — ABNORMAL HIGH (ref 0.7–1.3)
EGFR: 58 mL/min/{1.73_m2} — AB (ref >90)
Glucose: 85 mg/dl (ref 70–140)
Potassium: 3.8 mEq/L (ref 3.5–5.1)
Sodium: 140 mEq/L (ref 136–145)
Total Bilirubin: 0.35 mg/dL (ref 0.20–1.20)
Total Protein: 7.5 g/dL (ref 6.4–8.3)

## 2016-07-02 LAB — CBC WITH DIFFERENTIAL/PLATELET
BASO%: 0.6 % (ref 0.0–2.0)
BASOS ABS: 0 10*3/uL (ref 0.0–0.1)
EOS ABS: 0.1 10*3/uL (ref 0.0–0.5)
EOS%: 2.6 % (ref 0.0–7.0)
HCT: 39 % (ref 38.4–49.9)
HGB: 13.5 g/dL (ref 13.0–17.1)
LYMPH%: 25.6 % (ref 14.0–49.0)
MCH: 30.7 pg (ref 27.2–33.4)
MCHC: 34.6 g/dL (ref 32.0–36.0)
MCV: 88.6 fL (ref 79.3–98.0)
MONO#: 0.5 10*3/uL (ref 0.1–0.9)
MONO%: 10.5 % (ref 0.0–14.0)
NEUT#: 3.1 10*3/uL (ref 1.5–6.5)
NEUT%: 60.7 % (ref 39.0–75.0)
PLATELETS: 182 10*3/uL (ref 140–400)
RBC: 4.4 10*6/uL (ref 4.20–5.82)
RDW: 12.9 % (ref 11.0–14.6)
WBC: 5 10*3/uL (ref 4.0–10.3)
lymph#: 1.3 10*3/uL (ref 0.9–3.3)

## 2016-07-05 ENCOUNTER — Ambulatory Visit (HOSPITAL_BASED_OUTPATIENT_CLINIC_OR_DEPARTMENT_OTHER): Payer: BLUE CROSS/BLUE SHIELD | Admitting: Oncology

## 2016-07-05 ENCOUNTER — Telehealth: Payer: Self-pay | Admitting: Oncology

## 2016-07-05 VITALS — BP 135/83 | HR 64 | Temp 98.0°F | Resp 18 | Ht 73.0 in | Wt 202.5 lb

## 2016-07-05 DIAGNOSIS — C642 Malignant neoplasm of left kidney, except renal pelvis: Secondary | ICD-10-CM | POA: Diagnosis not present

## 2016-07-05 DIAGNOSIS — G629 Polyneuropathy, unspecified: Secondary | ICD-10-CM

## 2016-07-05 DIAGNOSIS — R911 Solitary pulmonary nodule: Secondary | ICD-10-CM

## 2016-07-05 DIAGNOSIS — C649 Malignant neoplasm of unspecified kidney, except renal pelvis: Secondary | ICD-10-CM

## 2016-07-05 NOTE — Progress Notes (Signed)
Hematology and Oncology Follow Up Visit  Joseph May [id number] 1961-08-09 55 y.o. 07/05/2016 3:18 PM Joseph May, MDClark, Joseph Cruise, MD   Principle Diagnosis: 55 year old with renal cell carcinoma diagnosed in September 2016. He was found to have a large left kidney mass. The pathology revealed clear cell histology T3a N0. Imaging study did not show any evidence of metastatic disease.   Prior Therapy: He is status post laparoscopic left radical nephrectomy done on 06/30/2015. The pathology revealed clear cell histology nuclear grade 4 measuring 3.6 cm. The final pathological staging T3aNX.  Current therapy: Observation and surveillance.  Interim History: Joseph May presents today for a follow-up visit. Since his last visit, he reports no major changes in his health and continues to recover from his surgery. He continues to perform work-related duties without any decline. He continues to have right leg pain which is neuropathic in nature and not dramatically changed. He feels this is not interfering with his ability to perform work-related duties.  He denied any respiratory symptoms of cough or shortness of breath. Has not reported any hematuria or dysuria. Has not reported any flank pain. He does not report any bone pain or arthralgias.  He does not report any headaches, blurry vision, syncope or seizures. He has not reported any fevers, chills or sweats. Does not report any cough, hemoptysis or wheezing. Desirable any chest pain, palpitation, orthopnea or leg edema. Does not report any nausea, vomiting, diarrhea, constipation or change in his bowel habits. He does not report any frequency, urgency or hesitancy. Does not report any skeletal complaints of arthralgias or myalgias. Remaining review of systems unremarkable.    Medications: I have reviewed the patient's current medications.  Current Outpatient Prescriptions  Medication Sig Dispense Refill  . acetaminophen (TYLENOL) 500 MG  tablet Take 2 tablets (1,000 mg total) by mouth every 6 (six) hours as needed for mild pain or moderate pain. 30 tablet 0  . Multiple Vitamin (MULTIVITAMIN WITH MINERALS) TABS tablet Take 1 tablet by mouth daily.     No current facility-administered medications for this visit.      Allergies: No Known Allergies  Past Medical History, Surgical history, Social history, and Family History were reviewed and updated.   Physical Exam: Blood pressure 135/83, pulse 64, temperature 98 F (36.7 C), temperature source Oral, resp. rate 18, height 6\' 1"  (1.854 m), weight 202 lb 8 oz (91.9 kg), SpO2 99 %. ECOG: 0 General appearance: Well-appearing gentleman without distress. Head: Normocephalic, without obvious abnormality no oral thrush. Neck: no adenopathy Lymph nodes: Cervical, supraclavicular, and axillary nodes normal. Heart:regular rate and rhythm, S1, S2 normal, no murmur, click, rub or gallop Lung:chest clear, no wheezing, rales, normal symmetric air entry Abdomin: soft, non-tender, without masses or organomegaly no shifting dullness or ascites. EXT:no erythema, induration, or nodules   Lab Results: Lab Results  Component Value Date   WBC 5.0 07/02/2016   HGB 13.5 07/02/2016   HCT 39.0 07/02/2016   MCV 88.6 07/02/2016   PLT 182 07/02/2016     Chemistry      Component Value Date/Time   NA 140 07/02/2016 1541   K 3.8 07/02/2016 1541   CL 100 08/03/2015 1516   CO2 24 07/02/2016 1541   BUN 16.3 07/02/2016 1541   CREATININE 1.4 (H) 07/02/2016 1541      Component Value Date/Time   CALCIUM 9.3 07/02/2016 1541   ALKPHOS 80 07/02/2016 1541   AST 15 07/02/2016 1541   ALT 16 07/02/2016 1541   BILITOT  0.35 07/02/2016 1541     EXAM: CT CHEST, ABDOMEN, AND PELVIS WITH CONTRAST  TECHNIQUE: Multidetector CT imaging of the chest, abdomen and pelvis was performed following the standard protocol during bolus administration of intravenous contrast.  CONTRAST:  177mL ISOVUE-300  IOPAMIDOL (ISOVUE-300) INJECTION 61%  COMPARISON:  12/30/2015  FINDINGS: CT CHEST FINDINGS  Cardiovascular: Heart is normal in size.  No pericardial effusion.  No evidence of thoracic aortic aneurysm.  Mediastinum/Nodes: No suspicious mediastinal, hilar, or axillary lymphadenopathy.  Visualized thyroid is unremarkable.  Lungs/Pleura: 3 mm calcified granuloma in the right lower lobe (series 4/ image 84), benign.  4 mm subpleural left lower lobe pulmonary nodule (series 4/image 109), unchanged.  No suspicious pulmonary nodules.  No focal consolidation.  No pleural effusion or pneumothorax.  Musculoskeletal: Degenerative changes of the thoracic spine.  CT ABDOMEN PELVIS FINDINGS  Hepatobiliary: 7 mm probable cyst in the medial segment left hepatic dome (series 2/ image 56). No suspicious/enhancing hepatic lesions.  Gallbladder is unremarkable. No intrahepatic or extrahepatic ductal dilatation.  Pancreas: Within normal limits.  Spleen: Within normal limits.  Adrenals/Urinary Tract: Adrenal glands are within normal limits.  Status post left nephrectomy. No abnormal soft tissue in the surgical bed.  Right kidney is within normal limits.  No hydronephrosis.  Bladder is within normal limits.  Stomach/Bowel: Stomach is within normal limits.  No evidence of bowel obstruction.  Normal appendix (series 2/ image 96).  Colonic diverticulosis, without evidence of diverticulitis.  Vascular/Lymphatic: No evidence of abdominal aortic aneurysm.  Atherosclerotic calcifications of the abdominal aorta and branch vessels.  Duplicated IVC (series 2/ image 86).  No suspicious abdominopelvic lymphadenopathy.  Reproductive: Prostate is unremarkable.  Other: No abdominopelvic ascites.  Musculoskeletal: Degenerative changes of the lumbar spine.  IMPRESSION: Status post left nephrectomy.  No evidence of recurrent or metastatic  disease.  Stable 4 mm subpleural left lower lobe pulmonary nodule, likely benign.    Impression and Plan:  55 year old gentleman with the following issues:  1. Renal cell carcinoma diagnosed in September 2016 after presenting with a large left kidney mass. He is status post left radical nephrectomy on 06/30/2015. The pathology revealed a 3.6 cm clear cell histology at T3a N0. He did not have any sarcomatoid features. He recovered well from the operation without any major complications.  CT scan on 07/02/2016 was reviewed today and discussed with the patient. The scan continues to show no evidence of metastatic disease..  The plan is to continue with active surveillance with repeat imaging studies in 6 months.   2. Peripheral neuropathy: Not cancer related at this time. His leg pain seems to be improving at this time from that neuropathy standpoint.  3. Very small nonspecific pulmonary nodules: Unchanged on the current CT scan. Appear to be benign etiology.  4. Follow-up: Will be in 6 and repeat imaging studies at that time.   [id number], MD 10/12/20173:18 PM

## 2016-07-05 NOTE — Telephone Encounter (Signed)
Gave patient avs report and appointments for April. [facility] will call re scan.  °

## 2016-07-09 ENCOUNTER — Telehealth: Payer: Self-pay | Admitting: Oncology

## 2016-07-09 NOTE — Telephone Encounter (Signed)
Per signed ROI form by pt to mail his records to his residence.

## 2016-07-19 DIAGNOSIS — D225 Melanocytic nevi of trunk: Secondary | ICD-10-CM | POA: Diagnosis not present

## 2016-07-19 DIAGNOSIS — B360 Pityriasis versicolor: Secondary | ICD-10-CM | POA: Diagnosis not present

## 2016-07-19 DIAGNOSIS — D485 Neoplasm of uncertain behavior of skin: Secondary | ICD-10-CM | POA: Diagnosis not present

## 2016-07-19 DIAGNOSIS — L578 Other skin changes due to chronic exposure to nonionizing radiation: Secondary | ICD-10-CM | POA: Diagnosis not present

## 2016-07-19 DIAGNOSIS — D229 Melanocytic nevi, unspecified: Secondary | ICD-10-CM | POA: Diagnosis not present

## 2016-07-19 DIAGNOSIS — L720 Epidermal cyst: Secondary | ICD-10-CM | POA: Diagnosis not present

## 2016-11-28 ENCOUNTER — Telehealth: Payer: Self-pay | Admitting: Medical Oncology

## 2016-11-28 ENCOUNTER — Other Ambulatory Visit: Payer: Self-pay | Admitting: Medical Oncology

## 2016-11-28 NOTE — Telephone Encounter (Signed)
Pt states Joseph May has expired for CT scans and need to be reordered ( requests 4/9 and change location to [place]) .  Note sent to managed care to verify before submitting new orders.  Labs r/s  to march 30 and gave him that appt . I told him to expect call from [facility] radiology re scan appt.

## 2016-12-21 ENCOUNTER — Other Ambulatory Visit (HOSPITAL_BASED_OUTPATIENT_CLINIC_OR_DEPARTMENT_OTHER): Payer: BLUE CROSS/BLUE SHIELD

## 2016-12-21 ENCOUNTER — Ambulatory Visit
Admission: RE | Admit: 2016-12-21 | Discharge: 2016-12-21 | Disposition: A | Discharge status: Home / Self Care | Payer: BLUE CROSS/BLUE SHIELD | Source: Ambulatory Visit | Attending: Oncology | Admitting: Oncology

## 2016-12-21 DIAGNOSIS — K7689 Other specified diseases of liver: Secondary | ICD-10-CM | POA: Insufficient documentation

## 2016-12-21 DIAGNOSIS — C799 Secondary malignant neoplasm of unspecified site: Secondary | ICD-10-CM | POA: Diagnosis not present

## 2016-12-21 DIAGNOSIS — Z905 Acquired absence of kidney: Secondary | ICD-10-CM | POA: Diagnosis not present

## 2016-12-21 DIAGNOSIS — R911 Solitary pulmonary nodule: Secondary | ICD-10-CM | POA: Insufficient documentation

## 2016-12-21 DIAGNOSIS — C649 Malignant neoplasm of unspecified kidney, except renal pelvis: Secondary | ICD-10-CM

## 2016-12-21 DIAGNOSIS — C642 Malignant neoplasm of left kidney, except renal pelvis: Secondary | ICD-10-CM

## 2016-12-21 LAB — COMPREHENSIVE METABOLIC PANEL
ALBUMIN: 4.1 g/dL (ref 3.5–5.0)
ALK PHOS: 81 U/L (ref 40–150)
ALT: 18 U/L (ref 0–55)
AST: 15 U/L (ref 5–34)
Anion Gap: 8 mEq/L (ref 3–11)
BILIRUBIN TOTAL: 0.74 mg/dL (ref 0.20–1.20)
BUN: 21.2 mg/dL (ref 7.0–26.0)
CO2: 25 mEq/L (ref 22–29)
Calcium: 9.4 mg/dL (ref 8.4–10.4)
Chloride: 106 mEq/L (ref 98–109)
Creatinine: 1.4 mg/dL — ABNORMAL HIGH (ref 0.7–1.3)
EGFR: 55 mL/min/{1.73_m2} — ABNORMAL LOW (ref >90)
GLUCOSE: 101 mg/dL (ref 70–140)
Potassium: 4.7 mEq/L (ref 3.5–5.1)
SODIUM: 139 meq/L (ref 136–145)
TOTAL PROTEIN: 7.3 g/dL (ref 6.4–8.3)

## 2016-12-21 LAB — CBC WITH DIFFERENTIAL/PLATELET
BASO%: 0.6 % (ref 0.0–2.0)
BASOS ABS: 0 10*3/uL (ref 0.0–0.1)
EOS%: 2.9 % (ref 0.0–7.0)
Eosinophils Absolute: 0.1 10*3/uL (ref 0.0–0.5)
HCT: 43.9 % (ref 38.4–49.9)
HGB: 15 g/dL (ref 13.0–17.1)
LYMPH%: 17.7 % (ref 14.0–49.0)
MCH: 29.9 pg (ref 27.2–33.4)
MCHC: 34.1 g/dL (ref 32.0–36.0)
MCV: 87.8 fL (ref 79.3–98.0)
MONO#: 0.5 10*3/uL (ref 0.1–0.9)
MONO%: 9.1 % (ref 0.0–14.0)
NEUT#: 3.6 10*3/uL (ref 1.5–6.5)
NEUT%: 69.7 % (ref 39.0–75.0)
PLATELETS: 192 10*3/uL (ref 140–400)
RBC: 5 10*6/uL (ref 4.20–5.82)
RDW: 13.3 % (ref 11.0–14.6)
WBC: 5.1 10*3/uL (ref 4.0–10.3)
lymph#: 0.9 10*3/uL (ref 0.9–3.3)

## 2016-12-21 MED ORDER — IOPAMIDOL (ISOVUE-370) INJECTION 76%
100.0000 mL | Freq: Once | INTRAVENOUS | Status: AC | PRN
  Administered 2016-12-21: 100 mL via INTRAVENOUS

## 2016-12-26 ENCOUNTER — Other Ambulatory Visit: Payer: BLUE CROSS/BLUE SHIELD

## 2017-01-02 ENCOUNTER — Ambulatory Visit (HOSPITAL_BASED_OUTPATIENT_CLINIC_OR_DEPARTMENT_OTHER): Payer: BLUE CROSS/BLUE SHIELD | Admitting: Oncology

## 2017-01-02 ENCOUNTER — Telehealth: Payer: Self-pay | Admitting: Oncology

## 2017-01-02 VITALS — BP 149/73 | HR 69 | Temp 97.8°F | Resp 18 | Ht 73.0 in | Wt 202.7 lb

## 2017-01-02 DIAGNOSIS — G629 Polyneuropathy, unspecified: Secondary | ICD-10-CM

## 2017-01-02 DIAGNOSIS — C642 Malignant neoplasm of left kidney, except renal pelvis: Secondary | ICD-10-CM

## 2017-01-02 DIAGNOSIS — C649 Malignant neoplasm of unspecified kidney, except renal pelvis: Secondary | ICD-10-CM

## 2017-01-02 NOTE — Progress Notes (Signed)
Hematology and Oncology Follow Up Visit  Joseph May [id number] 18-May-1961 56 y.o. 01/02/2017 3:52 PM [name], MDClark, Belenda Cruise, MD   Principle Diagnosis: 56 year old with renal cell carcinoma diagnosed in September 2016. He was found to have a large left kidney mass. The pathology revealed clear cell histology T3a N0. Imaging study did not show any evidence of metastatic disease.   Prior Therapy: He is status post laparoscopic left radical nephrectomy done on 06/30/2015. The pathology revealed clear cell histology nuclear grade 4 measuring 3.6 cm. The final pathological staging T3aNX.  Current therapy: Observation and surveillance.  Interim History: [name] presents today for a follow-up visit. Since his last visit, he reports no recent complaints. He continues to perform work-related duties without any decline. He does report neuropathic-like pain which continues to improve since his operation. He is not interfering with his ability to work, drive or attend any activities of daily living.   He denied any respiratory symptoms of cough or shortness of breath. Has not reported any hematuria or dysuria. Has not reported any flank pain. He does not report any bone pain or arthralgias.  He does not report any headaches, blurry vision, syncope or seizures. He has not reported any fevers, chills or sweats. Does not report any cough, hemoptysis or wheezing. Desirable any chest pain, palpitation, orthopnea or leg edema. Does not report any nausea, vomiting, diarrhea, constipation or change in his bowel habits. He does not report any frequency, urgency or hesitancy. Does not report any skeletal complaints of arthralgias or myalgias. Remaining review of systems unremarkable.    Medications: I have reviewed the patient's current medications.  Current Outpatient Prescriptions  Medication Sig Dispense Refill  . acetaminophen (TYLENOL) 500 MG tablet Take 2 tablets (1,000 mg total) by mouth every  6 (six) hours as needed for mild pain or moderate pain. 30 tablet 0  . Multiple Vitamin (MULTIVITAMIN WITH MINERALS) TABS tablet Take 1 tablet by mouth daily.     No current facility-administered medications for this visit.      Allergies: No Known Allergies  Past Medical History, Surgical history, Social history, and Family History were reviewed and updated.   Physical Exam: Blood pressure (!) 149/73, pulse 69, temperature 97.8 F (36.6 C), temperature source Oral, resp. rate 18, height 6\' 1"  (1.854 m), weight 202 lb 11.2 oz (91.9 kg), SpO2 100 %. ECOG: 0 General appearance: Alert, awake gentleman without distress Head: Normocephalic, without obvious abnormality no oral thrush. Neck: no adenopathy Lymph nodes: Cervical, supraclavicular, and axillary nodes normal. Heart:regular rate and rhythm, S1, S2 normal, no murmur, click, rub or gallop Lung:chest clear, no wheezing, rales, normal symmetric air entry Abdomin: soft, non-tender, without masses or organomegaly no rebound or guarding. EXT:no erythema, induration, or nodules   Lab Results: Lab Results  Component Value Date   WBC 5.1 12/21/2016   HGB 15.0 12/21/2016   HCT 43.9 12/21/2016   MCV 87.8 12/21/2016   PLT 192 12/21/2016     Chemistry      Component Value Date/Time   NA 139 12/21/2016 1018   K 4.7 12/21/2016 1018   CL 100 08/03/2015 1516   CO2 25 12/21/2016 1018   BUN 21.2 12/21/2016 1018   CREATININE 1.4 (H) 12/21/2016 1018      Component Value Date/Time   CALCIUM 9.4 12/21/2016 1018   ALKPHOS 81 12/21/2016 1018   AST 15 12/21/2016 1018   ALT 18 12/21/2016 1018   BILITOT 0.74 12/21/2016 1018     EXAM:  CT CHEST, ABDOMEN, AND PELVIS WITH CONTRAST  TECHNIQUE: Multidetector CT imaging of the chest, abdomen and pelvis was performed following the standard protocol during bolus administration of intravenous contrast.  CONTRAST:  100 cc Isovue 370  COMPARISON:  06/08/2016  FINDINGS: CT CHEST  FINDINGS  Cardiovascular: The heart size is normal.  No pericardial effusion.  Mediastinum/Nodes: No mediastinal lymphadenopathy. There is no hilar lymphadenopathy. The esophagus has normal imaging features. There is no axillary lymphadenopathy.  Lungs/Pleura: 5 mm subpleural posterior left lower lobe pulmonary nodule is unchanged. No focal airspace consolidation. No new pulmonary nodule or mass. No pulmonary edema or pleural effusion.  Musculoskeletal: Bone windows reveal no worrisome lytic or sclerotic osseous lesions.  CT ABDOMEN PELVIS FINDINGS  Hepatobiliary: 7 mm low-density lesion lateral segment left liver measures minimally larger today at 9 mm. Liver parenchyma otherwise unremarkable. There is no evidence for gallstones, gallbladder wall thickening, or pericholecystic fluid. No intrahepatic or extrahepatic biliary dilation.  Pancreas: No focal mass lesion. No dilatation of the main duct. No intraparenchymal cyst. No peripancreatic edema.  Spleen: No splenomegaly. No focal mass lesion.  Adrenals/Urinary Tract: No adrenal nodule or mass. Precontrast imaging of the right kidney is unremarkable. Imaging after IV contrast administration shows no enhancing lesion in the right kidney. Delayed imaging shows no abnormality of the intrarenal collecting system, renal pelvis, or ureter.  No focal bladder wall abnormality is evident although bladder lumen is un opacified.  Stomach/Bowel: Stomach is nondistended. No gastric wall thickening. No evidence of outlet obstruction. Tiny duodenal diverticulum noted. No small bowel wall thickening. No small bowel dilatation. The terminal ileum is normal. The appendix is normal. Diverticular changes are noted in the left colon without evidence of diverticulitis.  Vascular/Lymphatic: Duplicated left-sided IVC again noted. There is abdominal aortic atherosclerosis without aneurysm. There is no gastrohepatic or hepatoduodenal  ligament lymphadenopathy. No intraperitoneal or retroperitoneal lymphadenopathy. No pelvic sidewall lymphadenopathy.  Reproductive: The prostate gland and seminal vesicles have normal imaging features.  Other: No intraperitoneal free fluid.  Musculoskeletal: Bone windows reveal no worrisome lytic or sclerotic osseous lesions.  IMPRESSION: 1. Stable exam. No new or progressive findings to suggest recurrent or metastatic disease. 2. Status post left nephrectomy. 3. Stable tiny posterior left lower lobe subpleural nodule, likely benign. 4. Tiny low-density lesion lateral segment left liver minimally larger today but most likely benign.     Impression and Plan:  56 year old gentleman with the following issues:  1. Renal cell carcinoma diagnosed in September 2016 after presenting with a large left kidney mass. He is status post left radical nephrectomy on 06/30/2015. The pathology revealed a 3.6 cm clear cell histology at T3a N0. He did not have any sarcomatoid features. He recovered well from the operation without any major complications.  CT scan on 12/21/2016 was reviewed today and discussed with the patient. No evidence to suggest metastatic disease noted.   The plan is to continue with active surveillance with repeat imaging studies in 6 months.   2. Peripheral neuropathy: Appears to be improving at this time since his operation. Is not interfering with function at this time.  3. Follow-up: Will be in 6 months after repeat CT scan.   Zola Button, MD 4/11/20183:52 PM

## 2017-01-02 NOTE — Telephone Encounter (Signed)
Appointments scheduled per 01/02/17 los. Patient was given a copy of AVS report and appointment schedule, per 01/02/17 los. °

## 2017-01-07 ENCOUNTER — Ambulatory Visit (INDEPENDENT_AMBULATORY_CARE_PROVIDER_SITE_OTHER): Payer: BLUE CROSS/BLUE SHIELD | Admitting: Primary Care

## 2017-01-07 ENCOUNTER — Encounter: Payer: Self-pay | Admitting: Primary Care

## 2017-01-07 VITALS — BP 150/96 | HR 73 | Temp 98.0°F | Ht 73.0 in | Wt 203.0 lb

## 2017-01-07 DIAGNOSIS — M25512 Pain in left shoulder: Secondary | ICD-10-CM

## 2017-01-07 MED ORDER — PREDNISONE 20 MG PO TABS: ORAL_TABLET | ORAL | 0 refills | Status: DC

## 2017-01-07 MED ORDER — CYCLOBENZAPRINE HCL 5 MG PO TABS: 5.0000 mg | ORAL_TABLET | Freq: Three times a day (TID) | ORAL | 0 refills | Status: DC | PRN

## 2017-01-07 NOTE — Progress Notes (Signed)
Subjective:    Patient ID: Joseph May, male    DOB: 1961-07-03, 56 y.o.   MRN: [id number]  HPI  [name] is a 56 year old male who presents today with a chief complaint of shoulder pain. His pain is located to the left shoulder which he first noticed Thursday morning last week after waking. He thought he may have slept wrong on his shoulder and thought his pain would improve. By Saturday he noticed significant decrease in ROM with pain to his left shoulder. He's also noticed weakness and is unable to complete manual tasks at home and work. He's taken Ibuprofen and tylenol with some improvement. He's not taken anything today. He denies injury/trauma, numbness/tingling. He's very active and does a lot of repetitive movement with lifting at work and is active in the yard.   Review of Systems  Musculoskeletal: Positive for arthralgias.  Skin: Negative for color change.  Neurological: Negative for numbness.       Past Medical History:  Diagnosis Date  . Cancer ([place])    [place] 06/30/2015  . Hemorrhoids   . Renal mass Sept 2016     Social History   Social History  . Marital status: Married    Spouse name: N/A  . Number of children: N/A  . Years of education: N/A   Occupational History  . Not on file.   Social History Main Topics  . Smoking status: Never Smoker  . Smokeless tobacco: Never Used  . Alcohol use 0.0 oz/week     Comment: 2-3 beers at night  . Drug use: No  . Sexual activity: Not on file   Other Topics Concern  . Not on file   Social History Narrative   Married.  Lives in a one story home.   Works as a Dance movement psychotherapist.   1 daughter.     Enjoys visiting family at the beach.        Past Surgical History:  Procedure Laterality Date  . CYSTOSCOPY WITH STENT PLACEMENT Left 06/07/2015   Procedure: CYSTOSCOPY WITH STENT PLACEMENT;  Surgeon: Ardis Hughs, MD;  Location: [facility] OR[facility];  Service: Urology;  Laterality: Left;  . LAPAROSCOPIC NEPHRECTOMY Left  06/30/2015   Procedure: LAPAROSCOPIC LEFT  NEPHRECTOMY;  Surgeon: Ardis Hughs, MD;  Location: [facility];  Service: Urology;  Laterality: Left;  . NECK SURGERY      Family History  Problem Relation Age of Onset  . Diabetes Mother   . Breast cancer Mother   . Liver disease Father   . Alcohol abuse Father     Deceased 69  . Healthy Brother   . Healthy Daughter     No Known Allergies  Current Outpatient Prescriptions on File Prior to Visit  Medication Sig Dispense Refill  . Multiple Vitamin (MULTIVITAMIN WITH MINERALS) TABS tablet Take 1 tablet by mouth daily.    Marland Kitchen acetaminophen (TYLENOL) 500 MG tablet Take 2 tablets (1,000 mg total) by mouth every 6 (six) hours as needed for mild pain or moderate pain. (Patient not taking: Reported on 01/07/2017) 30 tablet 0   No current facility-administered medications on file prior to visit.     BP (!) 150/96   Pulse 73   Temp 98 F (36.7 C) (Oral)   Ht 6\' 1"  (1.854 m)   Wt 203 lb (92.1 kg)   SpO2 98%   BMI 26.78 kg/m    Objective:   Physical Exam  Neck: Neck supple.  Cardiovascular: Normal rate.  Pulmonary/Chest: Effort normal.  Musculoskeletal:       Left shoulder: He exhibits decreased range of motion, pain and decreased strength. He exhibits no tenderness, no swelling and no deformity.  Skin: Skin is warm and dry.          Assessment & Plan:  Acute Shoulder Pain:  Located to left shoulder x 4 days. Exam today with decrease in ROM with weakness due to pain. Exam suggestive of adhesive capsulitis given decrease in ROM with pain. Given history of nephrectomy will avoid NSAID's. Treat with prednisone taper and cyclobenzaprine course. Discussed exercises, heat/ice, avoid heavy exertion. He will update in 3-4 days if no improvement.  Sheral Flow, NP

## 2017-01-07 NOTE — Patient Instructions (Addendum)
Start prednisone tablets. Take three tablets for 3 days, then two tablets for 2 days, then one tablet for 2 days.  You may also try the muscle relaxer at bedtime as needed. You could take this up to three times daily but this may cause drowsiness.   Complete the exercises that were provided.  It was a pleasure to see you today!    Adhesive Capsulitis Adhesive capsulitis is inflammation of the tendons and ligaments that surround the shoulder joint (shoulder capsule). This condition causes the shoulder to become stiff and painful to move. Adhesive capsulitis is also called frozen shoulder. What are the causes? This condition may be caused by:  An injury to the shoulder joint.  Straining the shoulder.  Not moving the shoulder for a period of time. This can happen if your arm was injured or in a sling.  Long-standing health problems, such as:  Diabetes.  Thyroid problems.  Heart disease.  Stroke.  Rheumatoid arthritis.  Lung disease. In some cases, the cause may not be known. What increases the risk? This condition is more likely to develop in:  Women.  People who are older than 54 years of age. What are the signs or symptoms? Symptoms of this condition include:  Pain in the shoulder when moving the arm. There may also be pain when parts of the shoulder are touched. The pain is worse at night or when at rest.  Soreness or aching in the shoulder.  Inability to move the shoulder normally.  Muscle spasms. How is this diagnosed? This condition is diagnosed with a physical exam and imaging tests, such as an X-ray or MRI. How is this treated? This condition may be treated with:  Treatment of the underlying cause or condition.  Physical therapy. This involves performing exercises to get the shoulder moving again.  Medicine. Medicine may be given to relieve pain, inflammation, or muscle spasms.  Steroid injections into the shoulder joint.  Shoulder manipulation.  This is a procedure to move the shoulder into another position. It is done after you are given a medicine to make you fall asleep (general anesthetic). The joint may also be injected with salt water at high pressure to break down scarring.  Surgery. This may be done in severe cases when other treatments have failed. Although most people recover completely from adhesive capsulitis, some may not regain the full movement of the shoulder. Follow these instructions at home:  Take over-the-counter and prescription medicines only as told by your health care provider.  If you are being treated with physical therapy, follow instructions from your physical therapist.  Avoid exercises that put a lot of demand on your shoulder, such as throwing. These exercises can make pain worse.  If directed, apply ice to the injured area:  Put ice in a plastic bag.  Place a towel between your skin and the bag.  Leave the ice on for 20 minutes, 2-3 times per day. Contact a health care provider if:  You develop new symptoms.  Your symptoms get worse. This information is not intended to replace advice given to you by your health care provider. Make sure you discuss any questions you have with your health care provider. Document Released: 07/08/2009 Document Revised: 02/16/2016 Document Reviewed: 01/03/2015 Elsevier Interactive Patient Education  2017 [employer].

## 2017-01-07 NOTE — Progress Notes (Signed)
Pre visit review using our clinic review tool, if applicable. No additional management support is needed unless otherwise documented below in the visit note. 

## 2017-01-15 ENCOUNTER — Encounter: Payer: Self-pay | Admitting: Primary Care

## 2017-01-17 ENCOUNTER — Ambulatory Visit (INDEPENDENT_AMBULATORY_CARE_PROVIDER_SITE_OTHER)
Admission: RE | Admit: 2017-01-17 | Discharge: 2017-01-17 | Disposition: A | Discharge status: Home / Self Care | Payer: BLUE CROSS/BLUE SHIELD | Source: Ambulatory Visit | Attending: Family Medicine | Admitting: Family Medicine

## 2017-01-17 ENCOUNTER — Encounter: Payer: Self-pay | Admitting: Family Medicine

## 2017-01-17 ENCOUNTER — Ambulatory Visit (INDEPENDENT_AMBULATORY_CARE_PROVIDER_SITE_OTHER): Payer: BLUE CROSS/BLUE SHIELD | Admitting: Family Medicine

## 2017-01-17 VITALS — BP 150/86 | HR 88 | Temp 98.6°F | Ht 73.0 in | Wt 200.5 lb

## 2017-01-17 DIAGNOSIS — R202 Paresthesia of skin: Secondary | ICD-10-CM

## 2017-01-17 DIAGNOSIS — C642 Malignant neoplasm of left kidney, except renal pelvis: Secondary | ICD-10-CM | POA: Diagnosis not present

## 2017-01-17 DIAGNOSIS — Z981 Arthrodesis status: Secondary | ICD-10-CM | POA: Diagnosis not present

## 2017-01-17 DIAGNOSIS — M25512 Pain in left shoulder: Secondary | ICD-10-CM

## 2017-01-17 DIAGNOSIS — R200 Anesthesia of skin: Secondary | ICD-10-CM | POA: Diagnosis not present

## 2017-01-17 DIAGNOSIS — C649 Malignant neoplasm of unspecified kidney, except renal pelvis: Secondary | ICD-10-CM | POA: Insufficient documentation

## 2017-01-17 DIAGNOSIS — R29898 Other symptoms and signs involving the musculoskeletal system: Secondary | ICD-10-CM | POA: Diagnosis not present

## 2017-01-17 DIAGNOSIS — M62512 Muscle wasting and atrophy, not elsewhere classified, left shoulder: Secondary | ICD-10-CM | POA: Diagnosis not present

## 2017-01-17 DIAGNOSIS — R2990 Unspecified symptoms and signs involving the nervous system: Secondary | ICD-10-CM | POA: Diagnosis not present

## 2017-01-17 NOTE — Progress Notes (Signed)
Dr. Frederico Hamman T. Sonna Lipsky, MD, [facility] [place], [address] Phone: [phone] Fax: [phone]  01/17/2017  Patient: Joseph May, MRN: [id number], DOB: 01-09-1961, 56 y.o.  Primary Physician:  [name], MD   Chief Complaint  Patient presents with  . Shoulder Pain    Left-referred by K. Clark   Subjective:   Joseph May is a 56 y.o. very pleasant male patient who presents with the following:  The patient presents to me as a referral for evaluation of shoulder pain, but he presents to me as a gentleman with a history of renal cell carcinoma in 2018, status post nephrectomy. He has a significant history for a ruptured disc at C7, status post fusion per report approximately 25 years ago. He presents today with markedly abnormal strength patterns of his left upper extremity.  No clear injury. 2 weeks ago today and felt weird like he slept on it wrong. Then Friday evening could not lift it. On 01/07/2017, he was evaluated in this office by another provider, and he was placed on oral prednisone 60 mg x 3 days, then 40 mg x 2 days.  He has noticed some muscular wasting around his deltoid, particularly in the posterior aspect. He also has developed some tingling down the entirety of his left arm. At this point he cannot abduct his arm. He cannot flex his arm. And he cannot flex at the elbow.  At baseline, the patient is a very physical person who works 55 hours a week without difficulty.  Cannot do a bicep curl. 3/5 Tingling down arm. Resisted flexion and abduction falls immediately.  Grip is all normal and extension.  h/o ruptured disc at c7.  Past Medical History, Surgical History, Social History, Family History, Problem List, Medications, and Allergies have been reviewed and updated if relevant.  Patient Active Problem List   Diagnosis Date Noted  . Left renal mass 06/30/2015  . Left kidney mass 06/05/2015    . Hematuria 06/05/2015    Past Medical History:  Diagnosis Date  . Cancer ([place])    [place] 06/30/2015  . Hemorrhoids   . Renal mass Sept 2016    Past Surgical History:  Procedure Laterality Date  . CYSTOSCOPY WITH STENT PLACEMENT Left 06/07/2015   Procedure: CYSTOSCOPY WITH STENT PLACEMENT;  Surgeon: Ardis Hughs, MD;  Location: [facility] OR[facility];  Service: Urology;  Laterality: Left;  . LAPAROSCOPIC NEPHRECTOMY Left 06/30/2015   Procedure: LAPAROSCOPIC LEFT  NEPHRECTOMY;  Surgeon: Ardis Hughs, MD;  Location: [facility];  Service: Urology;  Laterality: Left;  . NECK SURGERY      Social History   Social History  . Marital status: Married    Spouse name: N/A  . Number of children: N/A  . Years of education: N/A   Occupational History  . Not on file.   Social History Main Topics  . Smoking status: Never Smoker  . Smokeless tobacco: Never Used  . Alcohol use 0.0 oz/week     Comment: 2-3 beers at night  . Drug use: No  . Sexual activity: Not on file   Other Topics Concern  . Not on file   Social History Narrative   Married.  Lives in a one story home.   Works as a Dance movement psychotherapist.   1 daughter.     Enjoys visiting family at the beach.        Family History  Problem Relation Age of  Onset  . Diabetes Mother   . Breast cancer Mother   . Liver disease Father   . Alcohol abuse Father     Deceased 55  . Healthy Brother   . Healthy Daughter     No Known Allergies  Medication list reviewed and updated in full in [facility].  GEN: No fevers, chills. Nontoxic. Primarily MSK c/o today. MSK: Detailed in the HPI GI: tolerating PO intake without difficulty Neuro: as above Otherwise, the pertinent positives and negatives are listed above and in the HPI, otherwise a full review of systems has been reviewed and is negative unless noted positive.   Objective:   BP (!) 150/86   Pulse 88   Temp 98.6 F (37 C) (Oral)   Ht 6\' 1"  (1.854 m)   Wt 200 lb 8 oz (90.9  kg)   BMI 26.45 kg/m    GEN: Well-developed,well-nourished,in no acute distress; alert,appropriate and cooperative throughout examination HEENT: Normocephalic and atraumatic without obvious abnormalities. Ears, externally no deformities CV: RRR, no m/g/r  PULM: Breathing comfortably in no respiratory distress EXT: No clubbing, cyanosis, or edema PSYCH: Normally interactive. Cooperative during the interview. Pleasant. Friendly and conversant. Not anxious or depressed appearing. Normal, full affect.  CERVICAL SPINE EXAM Range of motion: Flexion, extension, lateral bending, and rotation: Approximate 10% decrease of motion. Pain with terminal motion: Minimal Spinous Processes: NT SCM: NT Upper paracervical muscles: nt Upper traps: Mild ttp  Bicep reflexes appear to be absent on the left Bicep flexion, elbow, 3/5. On the right this is 5/5. Shoulder abduction: Left side, 2/5. On the right side this is 5/5 Shoulder flexion: Left side: 2/5. On the right side this is 5/5.  Grip strength both sides, normal, 5/5. Flexion and extension of the wrist, both sides, 5/5 Finger widening is 5/5 bilaterally.  Shoulder: L Inspection: There is fairly dramatic muscular wasting of the left deltoid compared to the right. There is minor biceps wasting compared to the right. There is no appreciable defect. The distal biceps tendon is clearly intact. There is no Popeye deformity. Ecchymosis/edema: neg  AC joint, scapula, clavicle: NT Abduction: Actively, 30 of abduction is possible. Passively 160 is achieved without difficulty. Flexion: Actively, 40 of flexion is achieved. Passively 160 is possible and comfortable. With the shoulder abducted to 90 passively, EROM to 90 is easily achieved. Internal range of motion to approximately 80 is achieved without difficulty. Neer: neg Hawkins: neg Drop Test: neg Empty Can: neg Supraspinatus insertion: NT Bicipital groove: NT Scapular dyskinesis:  none  Radiology: pending  Assessment and Plan:   Weakness of left arm - Plan: MR CERVICAL SPINE W WO CONTRAST  Acute pain of left shoulder - Plan: DG Shoulder Left, MR CERVICAL SPINE W WO CONTRAST  Renal cell carcinoma of left kidney (HCC) - Plan: MR CERVICAL SPINE W WO CONTRAST  Atrophy of muscle of left shoulder - Plan: MR CERVICAL SPINE W WO CONTRAST  Abnormal neurological exam - Plan: MR CERVICAL SPINE W WO CONTRAST  Numbness and tingling in left arm - Plan: MR CERVICAL SPINE W WO CONTRAST  History of fusion of cervical spine - Plan: MR CERVICAL SPINE W WO CONTRAST  Level of Medical Decision-Making in this case is HIGH.  Highly concerning case. Markedly abnormal examination. Markedly abnormal strength functionality of biceps function as well in the plane of abduction and flexion. Arm tingling tingling. Strength testing as above. 2/5 in flexion and abduction, 3/5 biceps function. Notably significant muscular wasting on the  left side.  History and examination are not consistent with acute orthopedic injury. Neurological versus neurosurgical compromise is in question.  The patient has a history of renal cell carcinoma within the last two-years. He also has a distant history of fusion at the level of C7.  Obtain an MRI of the cervical spine with and without contrast to evaluate for potential mass lesion, neoplasm, cord compromise, spinal cord edema, or other emergent condition. Given this clinical concern, we will obtain this individual's MRI urgently.  Follow-up: depending on MRI findings.  Medications Discontinued During This Encounter  Medication Reason  . predniSONE (DELTASONE) 20 MG tablet Completed Course   Orders Placed This Encounter  Procedures  . DG Shoulder Left  . MR CERVICAL SPINE W WO CONTRAST    Signed,  Frederico Hamman T. Maddyn Lieurance, MD   Allergies as of 01/17/2017   No Known Allergies     Medication List       Accurate as of 01/17/17  7:25 PM. Always use  your most recent med list.          acetaminophen 500 MG tablet Commonly known as:  TYLENOL Take 2 tablets (1,000 mg total) by mouth every 6 (six) hours as needed for mild pain or moderate pain.   cyclobenzaprine 5 MG tablet Commonly known as:  FLEXERIL Take 1 tablet (5 mg total) by mouth 3 (three) times daily as needed for muscle spasms.   multivitamin with minerals Tabs tablet Take 1 tablet by mouth daily.

## 2017-01-17 NOTE — Progress Notes (Signed)
Pre visit review using our clinic review tool, if applicable. No additional management support is needed unless otherwise documented below in the visit note. 

## 2017-01-18 ENCOUNTER — Ambulatory Visit
Admission: RE | Admit: 2017-01-18 | Discharge: 2017-01-18 | Disposition: A | Discharge status: Home / Self Care | Payer: BLUE CROSS/BLUE SHIELD | Source: Ambulatory Visit | Attending: Family Medicine | Admitting: Family Medicine

## 2017-01-18 ENCOUNTER — Telehealth: Payer: Self-pay | Admitting: *Deleted

## 2017-01-18 ENCOUNTER — Encounter: Payer: Self-pay | Admitting: Family Medicine

## 2017-01-18 ENCOUNTER — Other Ambulatory Visit: Payer: Self-pay | Admitting: Family Medicine

## 2017-01-18 DIAGNOSIS — M4803 Spinal stenosis, cervicothoracic region: Secondary | ICD-10-CM | POA: Insufficient documentation

## 2017-01-18 DIAGNOSIS — M62512 Muscle wasting and atrophy, not elsewhere classified, left shoulder: Secondary | ICD-10-CM

## 2017-01-18 DIAGNOSIS — C642 Malignant neoplasm of left kidney, except renal pelvis: Secondary | ICD-10-CM | POA: Diagnosis not present

## 2017-01-18 DIAGNOSIS — M25512 Pain in left shoulder: Secondary | ICD-10-CM

## 2017-01-18 DIAGNOSIS — R202 Paresthesia of skin: Secondary | ICD-10-CM | POA: Insufficient documentation

## 2017-01-18 DIAGNOSIS — R29898 Other symptoms and signs involving the musculoskeletal system: Secondary | ICD-10-CM

## 2017-01-18 DIAGNOSIS — R200 Anesthesia of skin: Secondary | ICD-10-CM

## 2017-01-18 DIAGNOSIS — M5031 Other cervical disc degeneration,  high cervical region: Secondary | ICD-10-CM | POA: Diagnosis not present

## 2017-01-18 DIAGNOSIS — Z981 Arthrodesis status: Secondary | ICD-10-CM

## 2017-01-18 DIAGNOSIS — R2990 Unspecified symptoms and signs involving the nervous system: Secondary | ICD-10-CM | POA: Insufficient documentation

## 2017-01-18 DIAGNOSIS — M4804 Spinal stenosis, thoracic region: Secondary | ICD-10-CM | POA: Insufficient documentation

## 2017-01-18 DIAGNOSIS — R937 Abnormal findings on diagnostic imaging of other parts of musculoskeletal system: Secondary | ICD-10-CM

## 2017-01-18 DIAGNOSIS — M50223 Other cervical disc displacement at C6-C7 level: Secondary | ICD-10-CM | POA: Insufficient documentation

## 2017-01-18 DIAGNOSIS — M4802 Spinal stenosis, cervical region: Secondary | ICD-10-CM | POA: Diagnosis not present

## 2017-01-18 MED ORDER — GADOBENATE DIMEGLUMINE 529 MG/ML IV SOLN
19.0000 mL | Freq: Once | INTRAVENOUS | Status: AC | PRN
  Administered 2017-01-18: 19 mL via INTRAVENOUS

## 2017-01-18 MED ORDER — TRAMADOL HCL 50 MG PO TABS: 50.0000 mg | ORAL_TABLET | Freq: Three times a day (TID) | ORAL | 0 refills | Status: DC | PRN

## 2017-01-18 NOTE — Telephone Encounter (Signed)
Tramadol called into [facility].  Patient notified prescription has been called to his pharmacy.

## 2017-01-18 NOTE — Telephone Encounter (Signed)
-----   Message from Jinny Sanders, MD sent at 01/18/2017  2:28 PM EDT ----- GFR > 30  Please call in tramadol 50 mg every 8 hours as needed for pain.  #20, 0 RF

## 2017-01-22 DIAGNOSIS — M4802 Spinal stenosis, cervical region: Secondary | ICD-10-CM | POA: Diagnosis not present

## 2017-01-24 ENCOUNTER — Other Ambulatory Visit: Payer: Self-pay | Admitting: Neurosurgery

## 2017-01-29 ENCOUNTER — Other Ambulatory Visit: Payer: Self-pay | Admitting: Neurosurgery

## 2017-01-29 ENCOUNTER — Encounter (HOSPITAL_COMMUNITY)
Admission: RE | Admit: 2017-01-29 | Discharge: 2017-01-29 | Disposition: A | Discharge status: Home / Self Care | Payer: BLUE CROSS/BLUE SHIELD | Source: Ambulatory Visit | Attending: Neurosurgery | Admitting: Neurosurgery

## 2017-01-29 ENCOUNTER — Encounter (HOSPITAL_COMMUNITY): Payer: Self-pay

## 2017-01-29 DIAGNOSIS — Z905 Acquired absence of kidney: Secondary | ICD-10-CM | POA: Diagnosis not present

## 2017-01-29 DIAGNOSIS — M50221 Other cervical disc displacement at C4-C5 level: Secondary | ICD-10-CM | POA: Diagnosis not present

## 2017-01-29 DIAGNOSIS — M4802 Spinal stenosis, cervical region: Secondary | ICD-10-CM | POA: Diagnosis not present

## 2017-01-29 DIAGNOSIS — Z85528 Personal history of other malignant neoplasm of kidney: Secondary | ICD-10-CM | POA: Diagnosis not present

## 2017-01-29 HISTORY — DX: Unspecified osteoarthritis, unspecified site: M19.90

## 2017-01-29 HISTORY — DX: Other specified diseases of liver: K76.89

## 2017-01-29 LAB — COMPREHENSIVE METABOLIC PANEL
ALK PHOS: 68 U/L (ref 38–126)
ALT: 22 U/L (ref 17–63)
AST: 20 U/L (ref 15–41)
Albumin: 4.3 g/dL (ref 3.5–5.0)
Anion gap: 7 (ref 5–15)
BUN: 16 mg/dL (ref 6–20)
CALCIUM: 9.3 mg/dL (ref 8.9–10.3)
CO2: 27 mmol/L (ref 22–32)
CREATININE: 1.34 mg/dL — AB (ref 0.61–1.24)
Chloride: 104 mmol/L (ref 101–111)
GFR, EST NON AFRICAN AMERICAN: 59 mL/min — AB (ref >60)
Glucose, Bld: 89 mg/dL (ref 65–99)
Potassium: 4 mmol/L (ref 3.5–5.1)
Sodium: 138 mmol/L (ref 135–145)
Total Bilirubin: 0.9 mg/dL (ref 0.3–1.2)
Total Protein: 7.2 g/dL (ref 6.5–8.1)

## 2017-01-29 LAB — CBC
HCT: 43 % (ref 39.0–52.0)
HEMOGLOBIN: 14.7 g/dL (ref 13.0–17.0)
MCH: 30 pg (ref 26.0–34.0)
MCHC: 34.2 g/dL (ref 30.0–36.0)
MCV: 87.8 fL (ref 78.0–100.0)
Platelets: 186 10*3/uL (ref 150–400)
RBC: 4.9 MIL/uL (ref 4.22–5.81)
RDW: 13 % (ref 11.5–15.5)
WBC: 4.8 10*3/uL (ref 4.0–10.5)

## 2017-01-29 LAB — SURGICAL PCR SCREEN
MRSA, PCR: NEGATIVE
Staphylococcus aureus: POSITIVE — AB

## 2017-01-29 NOTE — Pre-Procedure Instructions (Signed)
    Joseph May  01/29/2017      [facility] - [place], [place] - [address] [place] [address] Phone: [phone] Fax: [phone]    Your procedure is scheduled on Thursday, Jan 31, 2017  Report to [facility] Admitting at 11:45 A.M.  Call this number if you have problems the morning of surgery:  [phone]   Remember:  Do not eat food or drink liquids after midnight Wednesday, Jan 30, 2017  Take these medicines the morning of surgery with A SIP OF WATER : if needed: acetaminophen (TYLENOL) for pain Stop taking Aspirin, vitamins, fish oil and herbal medications. Do not take any NSAIDs ie: Ibuprofen, Advil, Naproxen, BC and Goody Powder or any medication containing Aspirin; stop now.  Do not wear jewelry, make-up or nail polish.  Do not wear lotions, powders, or perfumes, or deoderant.  Do not shave 48 hours prior to surgery.  Men may shave face and neck.  Do not bring valuables to the hospital.  [facility] is not responsible for any belongings or valuables. Contacts, dentures or bridgework may not be worn into surgery.  Leave your suitcase in the car.  After surgery it may be brought to your room. For patients admitted to the hospital, discharge time will be determined by your treatment team.  Patients discharged the day of surgery will not be allowed to drive home.  Special instructions: Shower the night before surgery and the morning of surgery with CHG.  Please read over the following fact sheets that you were given. Pain Booklet, Coughing and Deep Breathing, MRSA Information and Surgical Site Infection Prevention

## 2017-01-31 ENCOUNTER — Ambulatory Visit (HOSPITAL_COMMUNITY): Payer: BLUE CROSS/BLUE SHIELD

## 2017-01-31 ENCOUNTER — Ambulatory Visit (HOSPITAL_COMMUNITY): Payer: BLUE CROSS/BLUE SHIELD | Admitting: Certified Registered Nurse Anesthetist

## 2017-01-31 ENCOUNTER — Encounter (HOSPITAL_COMMUNITY): Payer: Self-pay | Admitting: Certified Registered Nurse Anesthetist

## 2017-01-31 ENCOUNTER — Encounter (HOSPITAL_COMMUNITY)
Admission: RE | Disposition: A | Discharge status: Home / Self Care | Payer: Self-pay | Source: Ambulatory Visit | Attending: Neurosurgery

## 2017-01-31 ENCOUNTER — Ambulatory Visit (HOSPITAL_COMMUNITY)
Admission: RE | Admit: 2017-01-31 | Discharge: 2017-02-01 | Disposition: A | Discharge status: Home / Self Care | Payer: BLUE CROSS/BLUE SHIELD | Source: Ambulatory Visit | Attending: Neurosurgery | Admitting: Neurosurgery

## 2017-01-31 DIAGNOSIS — M4802 Spinal stenosis, cervical region: Secondary | ICD-10-CM | POA: Insufficient documentation

## 2017-01-31 DIAGNOSIS — M5020 Other cervical disc displacement, unspecified cervical region: Secondary | ICD-10-CM | POA: Diagnosis present

## 2017-01-31 DIAGNOSIS — M50221 Other cervical disc displacement at C4-C5 level: Secondary | ICD-10-CM | POA: Insufficient documentation

## 2017-01-31 DIAGNOSIS — Z905 Acquired absence of kidney: Secondary | ICD-10-CM | POA: Insufficient documentation

## 2017-01-31 DIAGNOSIS — Z85528 Personal history of other malignant neoplasm of kidney: Secondary | ICD-10-CM | POA: Insufficient documentation

## 2017-01-31 DIAGNOSIS — Z419 Encounter for procedure for purposes other than remedying health state, unspecified: Secondary | ICD-10-CM

## 2017-01-31 DIAGNOSIS — Z981 Arthrodesis status: Secondary | ICD-10-CM | POA: Diagnosis not present

## 2017-01-31 HISTORY — PX: ANTERIOR CERVICAL DECOMP/DISCECTOMY FUSION: SHX1161

## 2017-01-31 SURGERY — ANTERIOR CERVICAL DECOMPRESSION/DISCECTOMY FUSION 2 LEVELS
Anesthesia: General | Site: Neck

## 2017-01-31 MED ORDER — PHENYLEPHRINE 40 MCG/ML (10ML) SYRINGE FOR IV PUSH (FOR BLOOD PRESSURE SUPPORT)
PREFILLED_SYRINGE | INTRAVENOUS | Status: AC
  Filled 2017-01-31: qty 10

## 2017-01-31 MED ORDER — HEMOSTATIC AGENTS (NO CHARGE) OPTIME
TOPICAL | Status: DC | PRN
  Administered 2017-01-31: 1 via TOPICAL

## 2017-01-31 MED ORDER — FENTANYL CITRATE (PF) 250 MCG/5ML IJ SOLN
INTRAMUSCULAR | Status: AC
  Filled 2017-01-31: qty 5

## 2017-01-31 MED ORDER — TIZANIDINE HCL 4 MG PO TABS: 4.0000 mg | ORAL_TABLET | Freq: Four times a day (QID) | ORAL | 0 refills | Status: DC | PRN

## 2017-01-31 MED ORDER — LIDOCAINE-EPINEPHRINE 1 %-1:100000 IJ SOLN
INTRAMUSCULAR | Status: AC
  Filled 2017-01-31: qty 1

## 2017-01-31 MED ORDER — CELECOXIB 200 MG PO CAPS
200.0000 mg | ORAL_CAPSULE | Freq: Two times a day (BID) | ORAL | Status: DC
  Administered 2017-01-31: 200 mg via ORAL
  Filled 2017-01-31: qty 1

## 2017-01-31 MED ORDER — ACETAMINOPHEN 650 MG RE SUPP: 650.0000 mg | RECTAL | Status: DC | PRN

## 2017-01-31 MED ORDER — LIDOCAINE 2% (20 MG/ML) 5 ML SYRINGE
INTRAMUSCULAR | Status: AC
  Filled 2017-01-31: qty 5

## 2017-01-31 MED ORDER — MIDAZOLAM HCL 5 MG/5ML IJ SOLN
INTRAMUSCULAR | Status: DC | PRN
  Administered 2017-01-31: 2 mg via INTRAVENOUS

## 2017-01-31 MED ORDER — PHENYLEPHRINE HCL 10 MG/ML IJ SOLN
INTRAVENOUS | Status: DC | PRN
  Administered 2017-01-31: 25 ug/min via INTRAVENOUS

## 2017-01-31 MED ORDER — CEFAZOLIN SODIUM-DEXTROSE 2-3 GM-% IV SOLR
INTRAVENOUS | Status: DC | PRN
  Administered 2017-01-31: 2 g via INTRAVENOUS

## 2017-01-31 MED ORDER — THROMBIN 5000 UNITS EX SOLR
CUTANEOUS | Status: DC | PRN
  Administered 2017-01-31 (×2): 5000 [IU] via TOPICAL

## 2017-01-31 MED ORDER — DOCUSATE SODIUM 100 MG PO CAPS
100.0000 mg | ORAL_CAPSULE | Freq: Two times a day (BID) | ORAL | Status: DC
  Administered 2017-01-31: 100 mg via ORAL
  Filled 2017-01-31: qty 1

## 2017-01-31 MED ORDER — PHENYLEPHRINE HCL 10 MG/ML IJ SOLN
INTRAMUSCULAR | Status: DC | PRN
  Administered 2017-01-31: 120 ug via INTRAVENOUS
  Administered 2017-01-31: 40 ug via INTRAVENOUS

## 2017-01-31 MED ORDER — DIAZEPAM 5 MG PO TABS
ORAL_TABLET | ORAL | Status: AC
  Filled 2017-01-31: qty 1

## 2017-01-31 MED ORDER — LIDOCAINE-EPINEPHRINE 1 %-1:100000 IJ SOLN
INTRAMUSCULAR | Status: DC | PRN
  Administered 2017-01-31: 5 mL

## 2017-01-31 MED ORDER — ONDANSETRON HCL 4 MG/2ML IJ SOLN
INTRAMUSCULAR | Status: AC
  Filled 2017-01-31: qty 2

## 2017-01-31 MED ORDER — PROPOFOL 10 MG/ML IV BOLUS
INTRAVENOUS | Status: AC
  Filled 2017-01-31: qty 20

## 2017-01-31 MED ORDER — LACTATED RINGERS IV SOLN
INTRAVENOUS | Status: DC
Start: 2017-01-31 — End: 2017-01-31
  Administered 2017-01-31 (×3): via INTRAVENOUS

## 2017-01-31 MED ORDER — THROMBIN 5000 UNITS EX SOLR
CUTANEOUS | Status: AC
  Filled 2017-01-31: qty 10000

## 2017-01-31 MED ORDER — CEFAZOLIN SODIUM-DEXTROSE 2-4 GM/100ML-% IV SOLN
INTRAVENOUS | Status: AC
  Filled 2017-01-31: qty 100

## 2017-01-31 MED ORDER — ACETAMINOPHEN 325 MG PO TABS: 650.0000 mg | ORAL_TABLET | ORAL | Status: DC | PRN

## 2017-01-31 MED ORDER — MIDAZOLAM HCL 2 MG/2ML IJ SOLN
INTRAMUSCULAR | Status: AC
  Filled 2017-01-31: qty 2

## 2017-01-31 MED ORDER — MENTHOL 3 MG MT LOZG: 1.0000 | LOZENGE | OROMUCOSAL | Status: DC | PRN

## 2017-01-31 MED ORDER — ROCURONIUM BROMIDE 100 MG/10ML IV SOLN
INTRAVENOUS | Status: DC | PRN
  Administered 2017-01-31: 20 mg via INTRAVENOUS
  Administered 2017-01-31: 30 mg via INTRAVENOUS
  Administered 2017-01-31: 50 mg via INTRAVENOUS

## 2017-01-31 MED ORDER — LIDOCAINE HCL (CARDIAC) 20 MG/ML IV SOLN
INTRAVENOUS | Status: DC | PRN
  Administered 2017-01-31: 100 mg via INTRAVENOUS

## 2017-01-31 MED ORDER — POTASSIUM CHLORIDE IN NACL 20-0.9 MEQ/L-% IV SOLN: INTRAVENOUS | Status: DC

## 2017-01-31 MED ORDER — ZOLPIDEM TARTRATE 5 MG PO TABS
5.0000 mg | ORAL_TABLET | Freq: Every evening | ORAL | Status: DC | PRN
Start: 2017-01-31 — End: 2017-02-01

## 2017-01-31 MED ORDER — SUCCINYLCHOLINE CHLORIDE 20 MG/ML IJ SOLN
INTRAMUSCULAR | Status: DC | PRN
  Administered 2017-01-31: 120 mg via INTRAVENOUS

## 2017-01-31 MED ORDER — SODIUM CHLORIDE 0.9 % IV SOLN
250.0000 mL | INTRAVENOUS | Status: DC
  Administered 2017-01-31: 250 mL via INTRAVENOUS

## 2017-01-31 MED ORDER — MORPHINE SULFATE (PF) 4 MG/ML IV SOLN: 2.0000 mg | INTRAVENOUS | Status: DC | PRN

## 2017-01-31 MED ORDER — PHENOL 1.4 % MT LIQD: 1.0000 | OROMUCOSAL | Status: DC | PRN

## 2017-01-31 MED ORDER — DIAZEPAM 5 MG PO TABS
5.0000 mg | ORAL_TABLET | Freq: Four times a day (QID) | ORAL | Status: DC | PRN
  Administered 2017-01-31: 5 mg via ORAL

## 2017-01-31 MED ORDER — SENNOSIDES-DOCUSATE SODIUM 8.6-50 MG PO TABS: 1.0000 | ORAL_TABLET | Freq: Every evening | ORAL | Status: DC | PRN

## 2017-01-31 MED ORDER — ONDANSETRON HCL 4 MG PO TABS: 4.0000 mg | ORAL_TABLET | Freq: Four times a day (QID) | ORAL | Status: DC | PRN

## 2017-01-31 MED ORDER — DEXAMETHASONE SODIUM PHOSPHATE 10 MG/ML IJ SOLN
INTRAMUSCULAR | Status: DC | PRN
  Administered 2017-01-31: 10 mg via INTRAVENOUS

## 2017-01-31 MED ORDER — 0.9 % SODIUM CHLORIDE (POUR BTL) OPTIME
TOPICAL | Status: DC | PRN
  Administered 2017-01-31: 1000 mL

## 2017-01-31 MED ORDER — OXYCODONE HCL 5 MG PO TABS
5.0000 mg | ORAL_TABLET | ORAL | Status: DC | PRN
  Administered 2017-01-31 – 2017-02-01 (×3): 10 mg via ORAL
  Filled 2017-01-31 (×2): qty 2

## 2017-01-31 MED ORDER — SUGAMMADEX SODIUM 200 MG/2ML IV SOLN
INTRAVENOUS | Status: DC | PRN
  Administered 2017-01-31: 200 mg via INTRAVENOUS

## 2017-01-31 MED ORDER — ONDANSETRON HCL 4 MG/2ML IJ SOLN
INTRAMUSCULAR | Status: DC | PRN
  Administered 2017-01-31: 4 mg via INTRAVENOUS

## 2017-01-31 MED ORDER — SODIUM CHLORIDE 0.9% FLUSH: 3.0000 mL | INTRAVENOUS | Status: DC | PRN

## 2017-01-31 MED ORDER — HYDROCODONE-ACETAMINOPHEN 5-325 MG PO TABS: 1.0000 | ORAL_TABLET | Freq: Four times a day (QID) | ORAL | 0 refills | Status: DC | PRN

## 2017-01-31 MED ORDER — ONDANSETRON HCL 4 MG/2ML IJ SOLN: 4.0000 mg | Freq: Four times a day (QID) | INTRAMUSCULAR | Status: DC | PRN

## 2017-01-31 MED ORDER — SODIUM CHLORIDE 0.9% FLUSH
3.0000 mL | Freq: Two times a day (BID) | INTRAVENOUS | Status: DC
  Administered 2017-01-31: 3 mL via INTRAVENOUS

## 2017-01-31 MED ORDER — PROPOFOL 10 MG/ML IV BOLUS
INTRAVENOUS | Status: DC | PRN
  Administered 2017-01-31: 200 mg via INTRAVENOUS

## 2017-01-31 MED ORDER — ESMOLOL HCL 100 MG/10ML IV SOLN
INTRAVENOUS | Status: DC | PRN
  Administered 2017-01-31: 20 mg via INTRAVENOUS
  Administered 2017-01-31 (×3): 10 mg via INTRAVENOUS

## 2017-01-31 MED ORDER — ESMOLOL HCL 100 MG/10ML IV SOLN
INTRAVENOUS | Status: AC
  Filled 2017-01-31: qty 10

## 2017-01-31 MED ORDER — FENTANYL CITRATE (PF) 100 MCG/2ML IJ SOLN
INTRAMUSCULAR | Status: DC | PRN
  Administered 2017-01-31: 50 ug via INTRAVENOUS
  Administered 2017-01-31: 150 ug via INTRAVENOUS
  Administered 2017-01-31: 100 ug via INTRAVENOUS
  Administered 2017-01-31: 50 ug via INTRAVENOUS

## 2017-01-31 MED ORDER — ACETAMINOPHEN 10 MG/ML IV SOLN
INTRAVENOUS | Status: DC | PRN
  Administered 2017-01-31: 1000 mg via INTRAVENOUS

## 2017-01-31 MED ORDER — OXYCODONE HCL 5 MG PO TABS
ORAL_TABLET | ORAL | Status: AC
Start: 2017-01-31 — End: 2017-02-01
  Filled 2017-01-31: qty 2

## 2017-01-31 SURGICAL SUPPLY — 72 items
BNDG GAUZE ELAST 4 BULKY (GAUZE/BANDAGES/DRESSINGS) IMPLANT
BUR DRUM 4.0 (BURR) ×2 IMPLANT
BUR MATCHSTICK NEURO 3.0 LAGG (BURR) ×2 IMPLANT
CANISTER SUCT 3000ML PPV (MISCELLANEOUS) ×2 IMPLANT
CARTRIDGE OIL MAESTRO DRILL (MISCELLANEOUS) ×1 IMPLANT
DECANTER SPIKE VIAL GLASS SM (MISCELLANEOUS) ×2 IMPLANT
DERMABOND ADVANCED (GAUZE/BANDAGES/DRESSINGS) ×1
DERMABOND ADVANCED .7 DNX12 (GAUZE/BANDAGES/DRESSINGS) ×1 IMPLANT
DIFFUSER DRILL AIR PNEUMATIC (MISCELLANEOUS) ×2 IMPLANT
DRAPE C-ARM 42X72 X-RAY (DRAPES) ×4 IMPLANT
DRAPE HALF SHEET 40X57 (DRAPES) IMPLANT
DRAPE LAPAROTOMY 100X72 PEDS (DRAPES) ×2 IMPLANT
DRAPE MICROSCOPE LEICA (MISCELLANEOUS) ×2 IMPLANT
DRAPE POUCH INSTRU U-SHP 10X18 (DRAPES) ×2 IMPLANT
DURAPREP 6ML APPLICATOR 50/CS (WOUND CARE) ×2 IMPLANT
ELECT COATED BLADE 2.86 ST (ELECTRODE) ×2 IMPLANT
ELECT REM PT RETURN 9FT ADLT (ELECTROSURGICAL) ×2
ELECTRODE REM PT RTRN 9FT ADLT (ELECTROSURGICAL) ×1 IMPLANT
GAUZE SPONGE 4X4 16PLY XRAY LF (GAUZE/BANDAGES/DRESSINGS) IMPLANT
GLOVE BIO SURGEON STRL SZ 6.5 (GLOVE) IMPLANT
GLOVE BIO SURGEON STRL SZ7 (GLOVE) IMPLANT
GLOVE BIO SURGEON STRL SZ7.5 (GLOVE) IMPLANT
GLOVE BIO SURGEON STRL SZ8 (GLOVE) ×2 IMPLANT
GLOVE BIO SURGEON STRL SZ8.5 (GLOVE) IMPLANT
GLOVE BIOGEL M 8.0 STRL (GLOVE) IMPLANT
GLOVE BIOGEL PI IND STRL 8 (GLOVE) ×3 IMPLANT
GLOVE BIOGEL PI IND STRL 8.5 (GLOVE) ×1 IMPLANT
GLOVE BIOGEL PI INDICATOR 8 (GLOVE) ×3
GLOVE BIOGEL PI INDICATOR 8.5 (GLOVE) ×1
GLOVE ECLIPSE 6.5 STRL STRAW (GLOVE) ×4 IMPLANT
GLOVE ECLIPSE 7.0 STRL STRAW (GLOVE) IMPLANT
GLOVE ECLIPSE 7.5 STRL STRAW (GLOVE) ×4 IMPLANT
GLOVE ECLIPSE 8.0 STRL XLNG CF (GLOVE) IMPLANT
GLOVE ECLIPSE 8.5 STRL (GLOVE) IMPLANT
GLOVE EXAM NITRILE LRG STRL (GLOVE) IMPLANT
GLOVE EXAM NITRILE XL STR (GLOVE) IMPLANT
GLOVE EXAM NITRILE XS STR PU (GLOVE) IMPLANT
GLOVE INDICATOR 6.5 STRL GRN (GLOVE) IMPLANT
GLOVE INDICATOR 7.0 STRL GRN (GLOVE) IMPLANT
GLOVE INDICATOR 7.5 STRL GRN (GLOVE) IMPLANT
GLOVE INDICATOR 8.0 STRL GRN (GLOVE) IMPLANT
GLOVE INDICATOR 8.5 STRL (GLOVE) IMPLANT
GLOVE OPTIFIT SS 8.0 STRL (GLOVE) IMPLANT
GLOVE SURG SS PI 6.5 STRL IVOR (GLOVE) IMPLANT
GOWN STRL REUS W/ TWL LRG LVL3 (GOWN DISPOSABLE) ×1 IMPLANT
GOWN STRL REUS W/ TWL XL LVL3 (GOWN DISPOSABLE) ×1 IMPLANT
GOWN STRL REUS W/TWL 2XL LVL3 (GOWN DISPOSABLE) ×4 IMPLANT
GOWN STRL REUS W/TWL LRG LVL3 (GOWN DISPOSABLE) ×1
GOWN STRL REUS W/TWL XL LVL3 (GOWN DISPOSABLE) ×1
KIT BASIN OR (CUSTOM PROCEDURE TRAY) ×2 IMPLANT
KIT ROOM TURNOVER OR (KITS) ×2 IMPLANT
NEEDLE HYPO 25X1 1.5 SAFETY (NEEDLE) ×2 IMPLANT
NEEDLE SPNL 22GX3.5 QUINCKE BK (NEEDLE) ×2 IMPLANT
NS IRRIG 1000ML POUR BTL (IV SOLUTION) ×2 IMPLANT
OIL CARTRIDGE MAESTRO DRILL (MISCELLANEOUS) ×2
PACK LAMINECTOMY NEURO (CUSTOM PROCEDURE TRAY) ×2 IMPLANT
PAD ARMBOARD 7.5X6 YLW CONV (MISCELLANEOUS) ×6 IMPLANT
PIN DISTRACTION 14MM (PIN) ×4 IMPLANT
PLATE HELIX R 22MM (Plate) ×4 IMPLANT
RUBBERBAND STERILE (MISCELLANEOUS) ×4 IMPLANT
SCREW 4.0X13 (Screw) ×8 IMPLANT
SCREW 4.0X13MM (Screw) ×8 IMPLANT
SPACER ACF PARALLEL 7MM (Bone Implant) ×2 IMPLANT
SPACER CC-ACF 8MM PARALLEL (Bone Implant) ×2 IMPLANT
SPONGE INTESTINAL PEANUT (DISPOSABLE) ×2 IMPLANT
SPONGE SURGIFOAM ABS GEL SZ50 (HEMOSTASIS) ×2 IMPLANT
SUT VIC AB 0 CT1 27 (SUTURE)
SUT VIC AB 0 CT1 27XBRD ANTBC (SUTURE) IMPLANT
SUT VIC AB 3-0 SH 8-18 (SUTURE) ×2 IMPLANT
TOWEL GREEN STERILE (TOWEL DISPOSABLE) ×2 IMPLANT
TOWEL GREEN STERILE FF (TOWEL DISPOSABLE) ×2 IMPLANT
WATER STERILE IRR 1000ML POUR (IV SOLUTION) ×2 IMPLANT

## 2017-01-31 NOTE — Transfer of Care (Signed)
Immediate Anesthesia Transfer of Care Note  Patient: Joseph May  Procedure(s) Performed: Procedure(s): ANTERIOR CERVICAL DECOMPRESSION/DISCECTOMY FUSION CERVICAL FOUR - CERVICAL FIVE , CERVICAL SIX- CERVICAL SEVEN (N/A)  Patient Location: PACU  Anesthesia Type:General  Level of Consciousness: awake, alert  and oriented  Airway & Oxygen Therapy: Patient Spontanous Breathing and Patient connected to nasal cannula oxygen  Post-op Assessment: Report given to RN and Post -op Vital signs reviewed and stable  Post vital signs: Reviewed and stable  Last Vitals:  Vitals:   01/31/17 1156 01/31/17 1742  BP: (!) 159/81 (!) (P) 155/102  Pulse: 75   Resp: 20 (P) 17  Temp: 36.8 C (P) 36.4 C    Last Pain:  Vitals:   01/31/17 1156  TempSrc: Oral         Complications: No apparent anesthesia complications

## 2017-01-31 NOTE — Anesthesia Procedure Notes (Signed)
Procedure Name: Intubation Date/Time: 01/31/2017 2:48 PM Performed by: Rejeana Brock L Pre-anesthesia Checklist: Patient identified, Emergency Drugs available, Suction available and Patient being monitored Patient Re-evaluated:Patient Re-evaluated prior to inductionOxygen Delivery Method: Circle System Utilized Preoxygenation: Pre-oxygenation with 100% oxygen Intubation Type: IV induction Ventilation: Mask ventilation without difficulty Laryngoscope Size: Glidescope and 4 Grade View: Grade I Tube type: Oral Tube size: 7.5 mm Number of attempts: 1 Airway Equipment and Method: Stylet and Oral airway Placement Confirmation: ETT inserted through vocal cords under direct vision,  positive ETCO2 and breath sounds checked- equal and bilateral Secured at: 23 cm Tube secured with: Tape Dental Injury: Teeth and Oropharynx as per pre-operative assessment

## 2017-01-31 NOTE — Discharge Instructions (Signed)

## 2017-01-31 NOTE — Discharge Summary (Signed)
Physician Discharge Summary  Patient ID: Joseph May MRN: [id number] DOB/AGE: 1960-12-01 56 y.o.  Admit date: 01/31/2017 Discharge date: 01/31/2017  Admission Diagnoses:HNP cervical C4/5, 66/7  Discharge Diagnoses:  Active Problems:   HNP (herniated nucleus pulposus), cervical   Discharged Condition: good  Hospital Course: Mr. Derusha was admitted and taken to the operating room for an uncomplicated ACDF at N7/9, and at C6/7. Post op he is voiding, ambulating, and tolerating a regular diet He is moving all extremities well. His wound is clean, dry, and without signs of infection. His voice is hoarse. Anterior Cervical decompression C4/5,6/7 Arthrodesis C4/5 with 72mm structural allograft, C6/7 with 74mm structural allograft Anterior instrumentation(Nuvasive) C4/5,6/7    Treatments: surgery: Anterior Cervical decompression C4/5,6/7 Arthrodesis C4/5 with 34mm structural allograft, C6/7 with 8mm structural allograft Anterior instrumentation(Nuvasive) C4/5,6/7     Discharge Exam: Blood pressure (!) 149/89, pulse 97, temperature 97.5 F (36.4 C), resp. rate 16, SpO2 98 %. General appearance: alert, cooperative and appears stated age Neurologic: Motor: weakness in the left upper extremity, 3/5  Disposition: 01-Home or Self Care STENOSIS, CERVICAL REGION Discharge Instructions    Incentive spirometry RT    Complete by:  As directed      Allergies as of 01/31/2017      Reactions   No Known Allergies       Medication List    TAKE these medications   acetaminophen 500 MG tablet Commonly known as:  TYLENOL Take 2 tablets (1,000 mg total) by mouth every 6 (six) hours as needed for mild pain or moderate pain.   HYDROcodone-acetaminophen 5-325 MG tablet Commonly known as:  NORCO/VICODIN Take 1 tablet by mouth every 6 (six) hours as needed for moderate pain.   tiZANidine 4 MG tablet Commonly known as:  ZANAFLEX Take 1 tablet (4 mg total) by mouth every 6 (six) hours as needed  for muscle spasms.      Follow-up Information    Ashok Pall, MD Follow up in 3 week(s).   Specialty:  Neurosurgery Why:  please call to make an appointment Contact information: [address] [place] [address] [phone]           Signed: Winfield Cunas 01/31/2017, 6:54 PM

## 2017-01-31 NOTE — H&P (Signed)
BP (!) 159/81   Pulse 75   Temp 98.2 F (36.8 C) (Oral)   Resp 20   SpO2 99%  Joseph May is a 56 year old gentleman who presents today with a history of pain since April 12 of this year with left shoulder pain and acute onset of profound weakness in the shoulder abductors. He was unable to lift his biceps. He was unable to flex his elbow. He was unable to abduct his left shoulder. He has gotten some movement back into the left biceps, he is at 3/5, but he still is unable to abduct the shoulder. There was no trauma. Nothing that he can think of. He did not have pain in his neck. He has some numbness in the arm when he lies down, in the left upper extremity. But little in the way of pain. He works as a English as a second language teacher. He is right handed. Does drink socially. No history of substance abuse or smoking. PAST MEDICAL HISTORY: Significant for clear cell carcinoma of the kidney. He has undergone a left nephrectomy and had an ACDF at C5-6 25 years ago, performed by Dr. Sherwood Gambler. He takes Tylenol for his pain. FAMILY HISTORY: Mother 17 in good health, has breast cancer. Father died at age 67. In his words, he has a left arm, which is numb and shoulder pain. He has no idea what started the pain. He says the pain has gotten worse because it has moved to his neck. Weakness in the left upper extremity, numbness and tingling there also. REVIEW OF SYSTEMS: Otherwise review of systems is negative. PHYSICAL EXAMINATION: He is 6 feet tall, weighs 200 pounds, temperature is 97.9, blood pressure is 157/92, pulse is 73. On exam, he is 0/5. 1/5 shoulder abductors on the left. He is 3/5 biceps. Triceps is approximately 5-/5. Normal strength in the right upper extremity. Normal strength in the lower extremities. Reflexes 2+ at the biceps, triceps, brachioradialis, knees, and ankles. Proprioception is intact in all extremities at the digits. Pupils equal, round, reactive to light. Full extraocular movements. Full visual fields.  Hearing intact to voice. Uvula elevates midline. Shoulder shrug normal. Tongue midline.  Romberg is negative. Gait is otherwise normal.  MRI of the cervical spine shows a solid fusion at C5-6, shows significant foraminal narrowing and certainly some spinal stenosis present at C4-5. Foraminal narrowing is very severe on the left side. He also has a disc eccentric to the left at C6-7. 3-4 does not look great, it is also spondylitic. Some mild cord canal narrowing, foraminal narrowing is certainly present.  I believe the symptomatic level, like the radiologist, is the C4-5 level. I think that is why the shoulder is that way, has the biceps weakness. Given the fact there is 1 level, I do not want to fuse him at 4 levels. He is relatively young at the age of 33. I would propose only fusing him at 4-5 and then at 6-7. The risks and benefits of bleeding, infection, no relief, fusion failure, hardware failure, no pain relief, further weakness, damage to the spinal cord, paralysis, bowel/bladder dysfunction, and other risks were explained. I also gave him a detailed instruction sheet. He is going to contact me to let me know what he would like to do.

## 2017-01-31 NOTE — Op Note (Signed)
01/31/2017  5:50 PM  PATIENT:  Joseph May  56 y.o. male with profound weakness in the left upper extremity and stenosis at C4/5 and a herniated disc at C6/7  PRE-OPERATIVE DIAGNOSIS:  STENOSIS, CERVICAL REGION C4/5, 6/7  POST-OPERATIVE DIAGNOSIS:  STENOSIS, CERVICAL REGION C4/5, 6/7  PROCEDURE:  Anterior Cervical decompression C4/5,6/7 Arthrodesis C4/5 with 40mm structural allograft, C6/7 with 35mm structural allograft Anterior instrumentation(Nuvasive) C4/5,6/7  SURGEON:   Surgeon(s): Ashok Pall, MD Erline Levine, MD   ASSISTANTS:Stern, Broadus John  ANESTHESIA:   general  EBL:  Total I/O In: 2000 [I.V.:2000] Out: 300 [Urine:250; Blood:50]  BLOOD ADMINISTERED:none  CELL SAVER GIVEN:none  COUNT:per nursing  DRAINS: none   SPECIMEN:  No Specimen  DICTATION: Mr. Cutbirth was taken to the operating room, intubated, and placed under general anesthesia without difficulty. He was positioned supine with his head in slight extension on a horseshoe headrest. The neck was prepped and draped in a sterile manner. I infiltrated 5 cc's 1/2%lidocaine/1:200,000 strength epinephrine into the planned incision starting from the midline to the medial border of the left sternocleidomastoid muscle. I opened the incision with a 10 blade and dissected sharply through soft tissue to the platysma. I dissected in the plane superior to the platysma both rostrally and caudally. I then opened the platysma in a horizontal fashion with Metzenbaum scissors, and dissected in the inferior plane rostrally and caudally. With both blunt and sharp technique I created an avascular corridor to the cervical spine. I placed a spinal needle(s) in the disc space at 4/5, and 6/7 . I then reflected the longus colli from C4-5, and C6-7 and placed self retaining retractors. I opened the disc space(s) at 4/5, and 6/7 with a 15 blade. I removed disc with curettes, Kerrison punches, and the drill. Using the drill I removed osteophytes  and prepared for the decompression.  I decompressed the spinal canal and the C5, and C7 root(s) with the drill, Kerrison punches, and the curettes. I used the microscope to aid in microdissection. I removed the posterior longitudinal ligament to fully expose and decompress the thecal sac. I exposed the roots laterally taking down the 4/5, and c6/7 uncovertebral joints. With the decompression complete we moved on to the arthrodesis. We used the drill to level the surfaces of C4,5,6, and 7. I removed soft tissue to prepare the disc space and the bony surfaces. I measured the space and placed a 60mm structural allograft into the disc space at C4/5 and 81mm at C6/7.  We then placed the anterior instrumentation. I placed 2 screws in each vertebral body through the plate. I locked the screws into place. Intraoperative xray showed the graft, plate, and screws to be in good position. I irrigated the wound, achieved hemostasis, and closed the wound in layers. I approximated the platysma, and the subcuticular plane with vicryl sutures. I used Dermabond for a sterile dressing.   PLAN OF CARE: Admit for overnight observation  PATIENT DISPOSITION:  PACU - hemodynamically stable.   Delay start of Pharmacological VTE agent (>24hrs) due to surgical blood loss or risk of bleeding:  yes

## 2017-01-31 NOTE — Anesthesia Preprocedure Evaluation (Signed)
Anesthesia Evaluation  Patient identified by MRN, date of birth, ID band Patient awake    Airway Mallampati: II       Dental no notable dental hx.    Pulmonary neg pulmonary ROS,    breath sounds clear to auscultation       Cardiovascular negative cardio ROS   Rhythm:Regular Rate:Normal     Neuro/Psych    GI/Hepatic negative GI ROS, Neg liver ROS,   Endo/Other  negative endocrine ROS  Renal/GU      Musculoskeletal  (+) Arthritis ,   Abdominal   Peds  Hematology negative hematology ROS (+)   Anesthesia Other Findings   Reproductive/Obstetrics                             Anesthesia Physical Anesthesia Plan  ASA: I  Anesthesia Plan: General   Post-op Pain Management:    Induction: Intravenous  Airway Management Planned: Oral ETT  Additional Equipment:   Intra-op Plan:   Post-operative Plan: Extubation in OR  Informed Consent: I have reviewed the patients History and Physical, chart, labs and discussed the procedure including the risks, benefits and alternatives for the proposed anesthesia with the patient or authorized representative who has indicated his/her understanding and acceptance.   Dental advisory given  Plan Discussed with: CRNA  Anesthesia Plan Comments:         Anesthesia Quick Evaluation

## 2017-02-01 ENCOUNTER — Encounter (HOSPITAL_COMMUNITY): Payer: Self-pay | Admitting: Neurosurgery

## 2017-02-01 DIAGNOSIS — M4802 Spinal stenosis, cervical region: Secondary | ICD-10-CM | POA: Diagnosis not present

## 2017-02-01 DIAGNOSIS — M50221 Other cervical disc displacement at C4-C5 level: Secondary | ICD-10-CM | POA: Diagnosis not present

## 2017-02-01 DIAGNOSIS — Z905 Acquired absence of kidney: Secondary | ICD-10-CM | POA: Diagnosis not present

## 2017-02-01 DIAGNOSIS — Z85528 Personal history of other malignant neoplasm of kidney: Secondary | ICD-10-CM | POA: Diagnosis not present

## 2017-02-01 NOTE — Progress Notes (Signed)
Discharge instructions read and given to patient and wife. Teach back demonstrated with understanding. Pt comfortable to go home. No complains from patient or wife. Patient wheeled out in a W/C and assisted to his car without and problem.

## 2017-02-04 NOTE — Anesthesia Postprocedure Evaluation (Signed)
Anesthesia Post Note  Patient: Joseph May  Procedure(s) Performed: Procedure(s) (LRB): ANTERIOR CERVICAL DECOMPRESSION/DISCECTOMY FUSION CERVICAL FOUR - CERVICAL FIVE , CERVICAL SIX- CERVICAL SEVEN (N/A)  Patient location during evaluation: PACU Anesthesia Type: General Level of consciousness: awake and alert and patient cooperative Pain management: pain level controlled Vital Signs Assessment: post-procedure vital signs reviewed and stable Respiratory status: spontaneous breathing and respiratory function stable Cardiovascular status: stable Anesthetic complications: no        Last Vitals:  Vitals:   01/31/17 2310 02/01/17 0444  BP: (!) 158/91 131/84  Pulse: (!) 108 88  Resp: 18 18  Temp: 37 C 36.7 C    Last Pain:  Vitals:   02/01/17 0551  TempSrc:   PainSc: 6    Pain Goal: Patients Stated Pain Goal: 3 (02/01/17 0551)               [name]

## 2017-02-19 DIAGNOSIS — M4802 Spinal stenosis, cervical region: Secondary | ICD-10-CM | POA: Diagnosis not present

## 2017-04-02 DIAGNOSIS — M629 Disorder of muscle, unspecified: Secondary | ICD-10-CM | POA: Diagnosis not present

## 2017-04-11 ENCOUNTER — Other Ambulatory Visit: Payer: Self-pay | Admitting: Neurosurgery

## 2017-04-11 DIAGNOSIS — M6289 Other specified disorders of muscle: Secondary | ICD-10-CM

## 2017-04-16 ENCOUNTER — Ambulatory Visit
Admission: RE | Admit: 2017-04-16 | Discharge: 2017-04-16 | Disposition: A | Discharge status: Home / Self Care | Payer: BLUE CROSS/BLUE SHIELD | Source: Ambulatory Visit | Attending: Neurosurgery | Admitting: Neurosurgery

## 2017-04-16 DIAGNOSIS — M6289 Other specified disorders of muscle: Secondary | ICD-10-CM

## 2017-04-16 DIAGNOSIS — M629 Disorder of muscle, unspecified: Secondary | ICD-10-CM | POA: Diagnosis not present

## 2017-04-16 DIAGNOSIS — R600 Localized edema: Secondary | ICD-10-CM | POA: Diagnosis not present

## 2017-04-23 ENCOUNTER — Encounter: Payer: Self-pay | Admitting: Family Medicine

## 2017-05-07 IMAGING — CT CT ABD-PELV W/ CM
2 of 3 series · 15 of 30 positions shown, 18 images · IV contrast (iopamidol)
Comparison: CT chest abdomen pelvis dated 09/27/2015

CLINICAL DATA: Renal cell cancer, status post left nephrectomy.

EXAM:
CT CHEST, ABDOMEN, AND PELVIS WITH CONTRAST
TECHNIQUE: Multidetector CT imaging of the chest, abdomen and pelvis was
performed following the standard protocol during bolus
administration of intravenous contrast.
CONTRAST:  100mL [id number] IOPAMIDOL ([id number]) INJECTION 61%

[Series 2: cap with · axial · 0.71mm/px · z∈[-1170,-610]mm · 11 of 138 slices shown, 14 images]
[im 13/138  mediastinal]
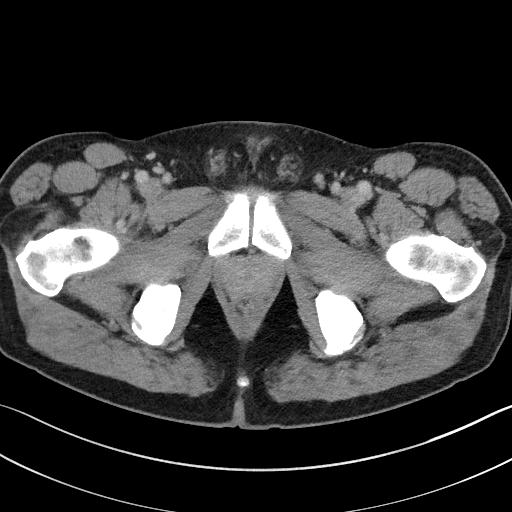
[im 13/138  lung]
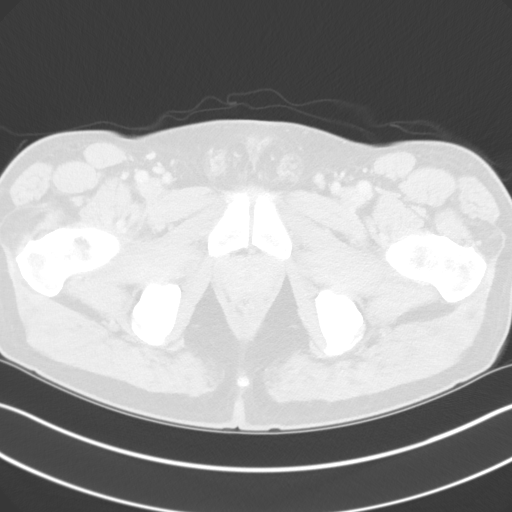
[im 25/138  lung]
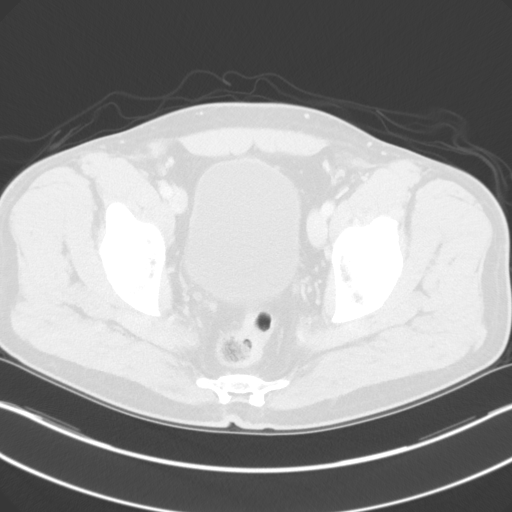
[im 38/138  lung]
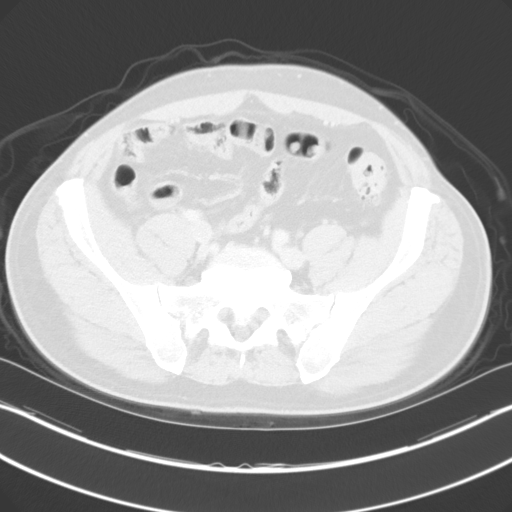
[im 50/138  lung]
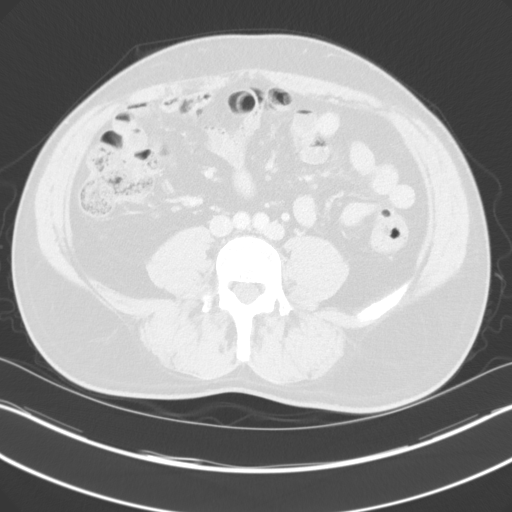
[im 63/138  mediastinal]
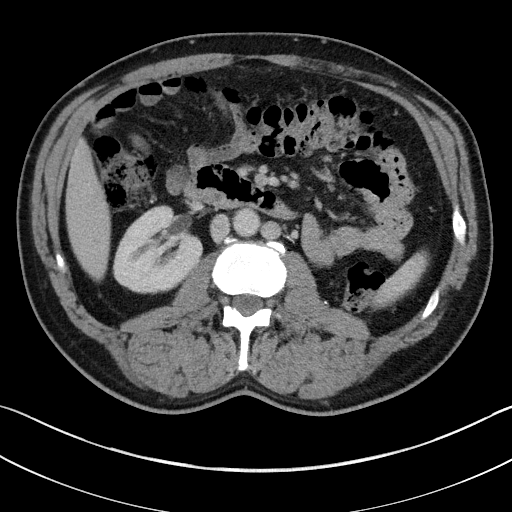
[im 63/138  lung]
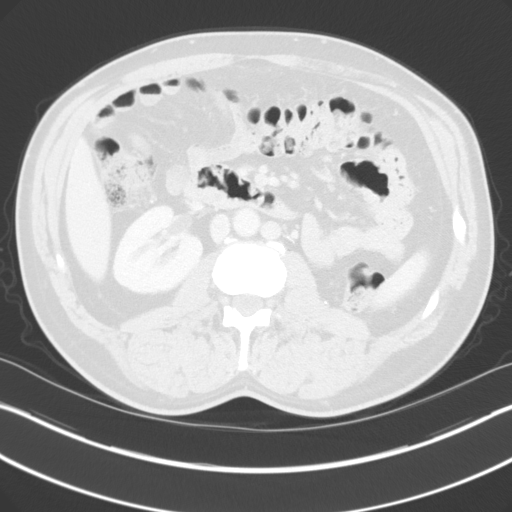
[im 68/138  lung]
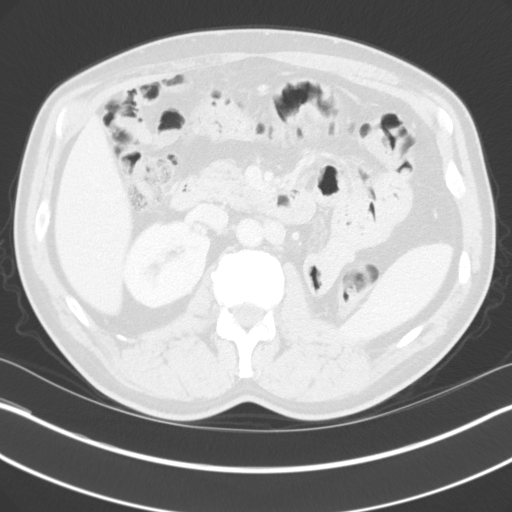
[im 75/138  lung]
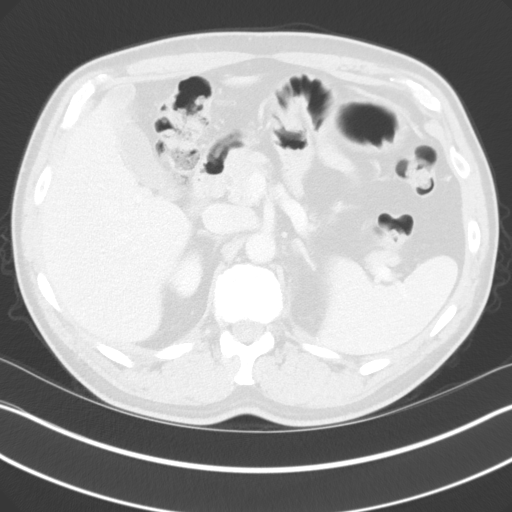
[im 88/138  lung]
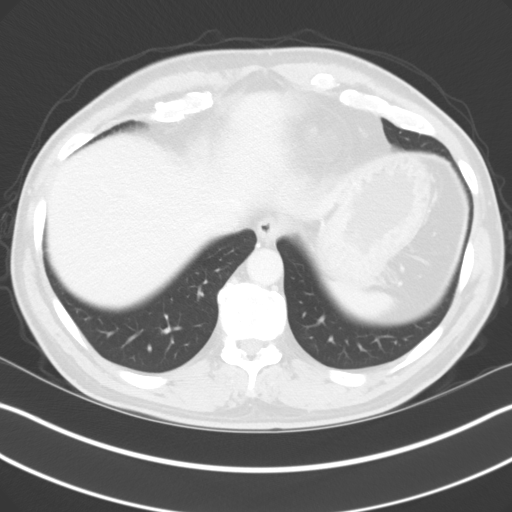
[im 100/138  mediastinal]
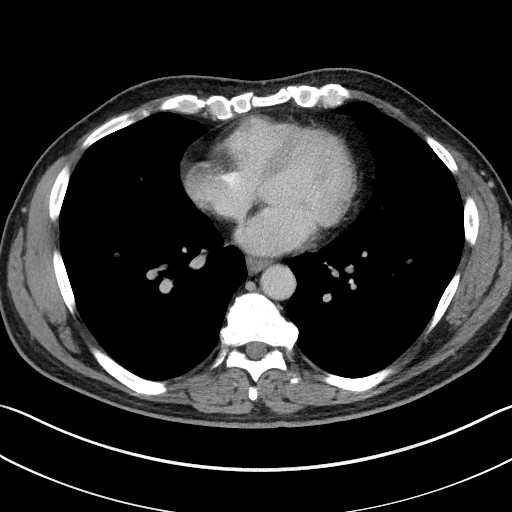
[im 100/138  lung]
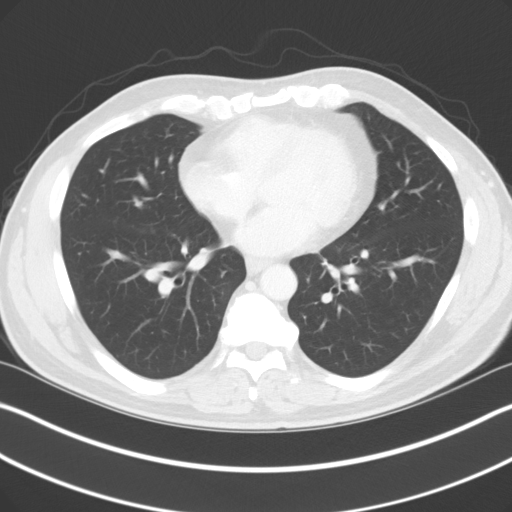
[im 113/138  lung]
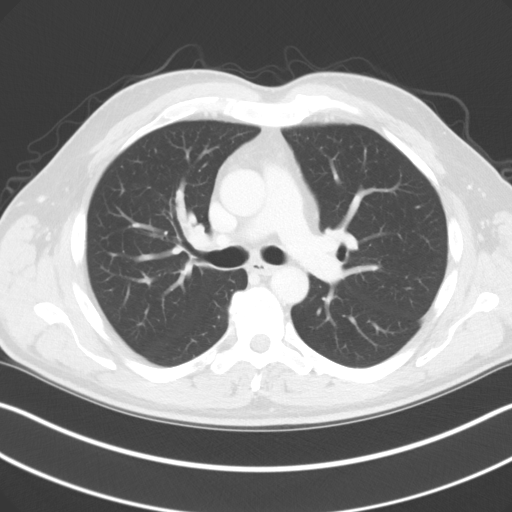
[im 125/138  lung]
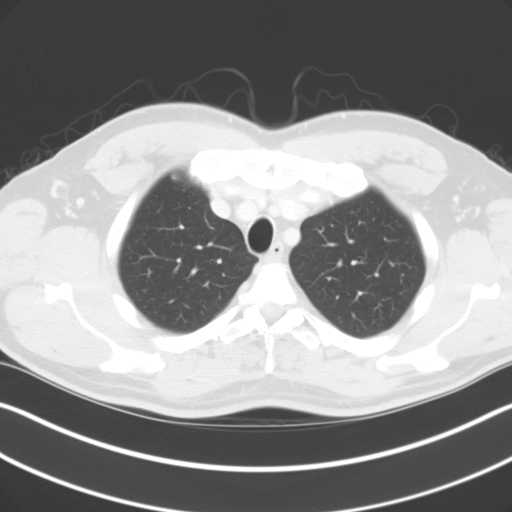

[Series 4: lung · axial · 0.71mm/px · z∈[-837,-731]mm · 4 of 160 slices shown]
[im 14/160  lung]
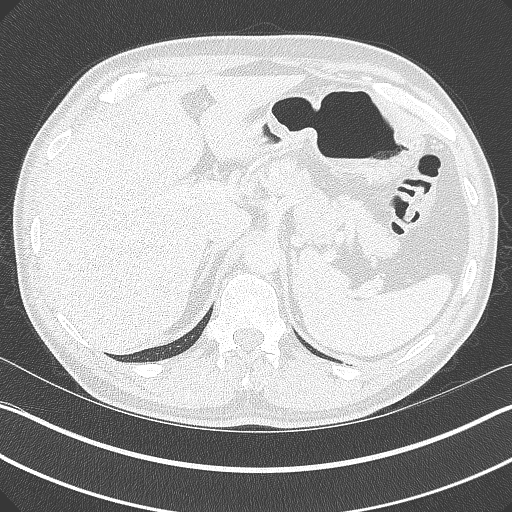
[im 40/160  lung]
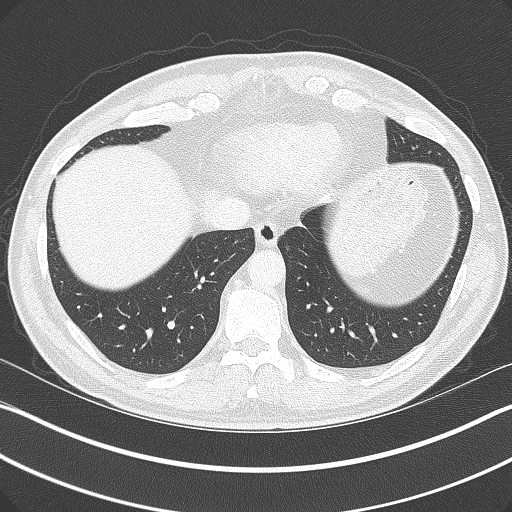
[im 54/160  lung]
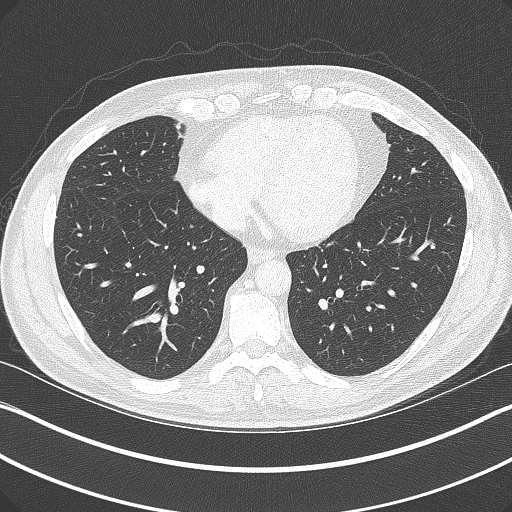
[im 67/160  lung]
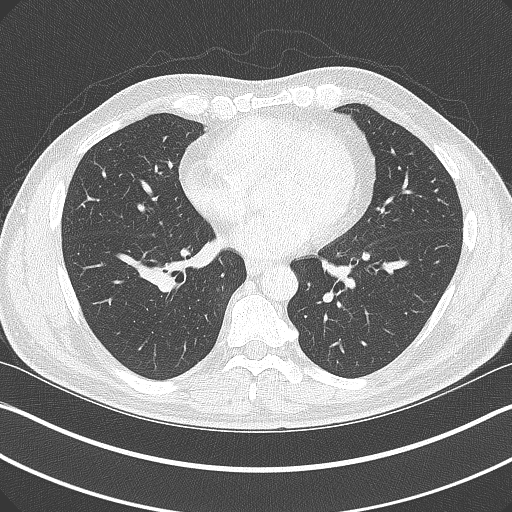

[15 of 30 positions shown; findings below may reference images not displayed]

FINDINGS: CT CHEST FINDINGS

Mediastinum/Nodes: The heart is normal in size. No pericardial
effusion.

No suspicious mediastinal lymphadenopathy.

Visualized thyroid is unremarkable.

Lungs/Pleura: 4 mm subpleural left lower lobe pulmonary nodule
(series 4/image 113), unchanged.

3 mm calcified granuloma in the right lower lobe (series 4/image
86), benign.

No suspicious pulmonary nodules.

No focal consolidation.

No pleural effusion or pneumothorax.

Musculoskeletal: Mild degenerative changes of the thoracic spine.

CT ABDOMEN PELVIS FINDINGS

Hepatobiliary: Liver is notable for a 7 mm cyst in the medial
segment left hepatic dome (series 2/image 53).

Gallbladder is unremarkable. No intrahepatic or extrahepatic ductal
dilatation.

Pancreas: Within normal limits.

Spleen: Within normal limits.

Adrenals/Urinary Tract: Adrenal glands are within normal limits.

Status post left nephrectomy. No abnormal soft tissue in the
surgical bed.

Right kidney is within normal limits.  No hydronephrosis.

Bladder is within normal limits.

Stomach/Bowel: Stomach is within normal limits.

No evidence of bowel obstruction.

Normal appendix (series 2/ image 100).

Mild sigmoid diverticulosis, without evidence of diverticulitis

Vascular/Lymphatic: No evidence of abdominal aortic aneurysm.
Duplicated left IVC.

No suspicious abdominopelvic lymphadenopathy.

Reproductive: Prostate is unremarkable.

Other: No abdominopelvic ascites.

Musculoskeletal: Degenerative changes of the lumbar spine.
IMPRESSION: Status post left nephrectomy.

No evidence of recurrent or metastatic disease.

Stable 4 mm subpleural left lower lobe pulmonary nodule, likely
benign.

## 2017-06-28 ENCOUNTER — Other Ambulatory Visit (HOSPITAL_BASED_OUTPATIENT_CLINIC_OR_DEPARTMENT_OTHER): Payer: BLUE CROSS/BLUE SHIELD

## 2017-06-28 DIAGNOSIS — C642 Malignant neoplasm of left kidney, except renal pelvis: Secondary | ICD-10-CM | POA: Diagnosis not present

## 2017-06-28 DIAGNOSIS — C649 Malignant neoplasm of unspecified kidney, except renal pelvis: Secondary | ICD-10-CM

## 2017-06-28 LAB — COMPREHENSIVE METABOLIC PANEL
ALT: 19 U/L (ref 0–55)
ANION GAP: 8 meq/L (ref 3–11)
AST: 15 U/L (ref 5–34)
Albumin: 4 g/dL (ref 3.5–5.0)
Alkaline Phosphatase: 82 U/L (ref 40–150)
BILIRUBIN TOTAL: 0.57 mg/dL (ref 0.20–1.20)
BUN: 15.2 mg/dL (ref 7.0–26.0)
CHLORIDE: 104 meq/L (ref 98–109)
CO2: 27 meq/L (ref 22–29)
CREATININE: 1.3 mg/dL (ref 0.7–1.3)
Calcium: 9.5 mg/dL (ref 8.4–10.4)
EGFR: 63 mL/min/{1.73_m2} — AB (ref >90)
GLUCOSE: 104 mg/dL (ref 70–140)
Potassium: 4.4 mEq/L (ref 3.5–5.1)
SODIUM: 139 meq/L (ref 136–145)
TOTAL PROTEIN: 7.2 g/dL (ref 6.4–8.3)

## 2017-06-28 LAB — CBC WITH DIFFERENTIAL/PLATELET
BASO%: 0.6 % (ref 0.0–2.0)
BASOS ABS: 0 10*3/uL (ref 0.0–0.1)
EOS%: 1.8 % (ref 0.0–7.0)
Eosinophils Absolute: 0.1 10*3/uL (ref 0.0–0.5)
HEMATOCRIT: 42.8 % (ref 38.4–49.9)
HEMOGLOBIN: 14.4 g/dL (ref 13.0–17.1)
LYMPH#: 1 10*3/uL (ref 0.9–3.3)
LYMPH%: 20 % (ref 14.0–49.0)
MCH: 30 pg (ref 27.2–33.4)
MCHC: 33.6 g/dL (ref 32.0–36.0)
MCV: 89.4 fL (ref 79.3–98.0)
MONO#: 0.4 10*3/uL (ref 0.1–0.9)
MONO%: 8.4 % (ref 0.0–14.0)
NEUT%: 69.2 % (ref 39.0–75.0)
NEUTROS ABS: 3.5 10*3/uL (ref 1.5–6.5)
Platelets: 179 10*3/uL (ref 140–400)
RBC: 4.78 10*6/uL (ref 4.20–5.82)
RDW: 13.4 % (ref 11.0–14.6)
WBC: 5 10*3/uL (ref 4.0–10.3)

## 2017-07-01 ENCOUNTER — Ambulatory Visit
Admission: RE | Admit: 2017-07-01 | Discharge: 2017-07-01 | Disposition: A | Discharge status: Home / Self Care | Payer: BLUE CROSS/BLUE SHIELD | Source: Ambulatory Visit | Attending: Oncology | Admitting: Oncology

## 2017-07-01 DIAGNOSIS — Q268 Other congenital malformations of great veins: Secondary | ICD-10-CM | POA: Insufficient documentation

## 2017-07-01 DIAGNOSIS — Z905 Acquired absence of kidney: Secondary | ICD-10-CM | POA: Insufficient documentation

## 2017-07-01 DIAGNOSIS — R918 Other nonspecific abnormal finding of lung field: Secondary | ICD-10-CM | POA: Diagnosis not present

## 2017-07-01 DIAGNOSIS — C649 Malignant neoplasm of unspecified kidney, except renal pelvis: Secondary | ICD-10-CM

## 2017-07-01 DIAGNOSIS — R109 Unspecified abdominal pain: Secondary | ICD-10-CM | POA: Diagnosis not present

## 2017-07-01 DIAGNOSIS — I700 Atherosclerosis of aorta: Secondary | ICD-10-CM | POA: Insufficient documentation

## 2017-07-01 DIAGNOSIS — C642 Malignant neoplasm of left kidney, except renal pelvis: Secondary | ICD-10-CM | POA: Insufficient documentation

## 2017-07-01 MED ORDER — IOPAMIDOL (ISOVUE-300) INJECTION 61%
100.0000 mL | Freq: Once | INTRAVENOUS | Status: AC | PRN
  Administered 2017-07-01: 100 mL via INTRAVENOUS

## 2017-07-04 ENCOUNTER — Ambulatory Visit (HOSPITAL_BASED_OUTPATIENT_CLINIC_OR_DEPARTMENT_OTHER): Payer: BLUE CROSS/BLUE SHIELD | Admitting: Oncology

## 2017-07-04 VITALS — BP 153/72 | HR 70 | Temp 98.2°F | Resp 20 | Ht 73.0 in | Wt 201.5 lb

## 2017-07-04 DIAGNOSIS — C642 Malignant neoplasm of left kidney, except renal pelvis: Secondary | ICD-10-CM

## 2017-07-04 DIAGNOSIS — C649 Malignant neoplasm of unspecified kidney, except renal pelvis: Secondary | ICD-10-CM

## 2017-07-04 DIAGNOSIS — G620 Drug-induced polyneuropathy: Secondary | ICD-10-CM | POA: Diagnosis not present

## 2017-07-04 NOTE — Progress Notes (Signed)
Hematology and Oncology Follow Up Visit  Joseph May [id number] 1960/10/15 56 y.o. 07/04/2017 3:51 PM [name], MDClark, Belenda Cruise, MD   Principle Diagnosis: 56 year old with renal cell carcinoma diagnosed in September 2016. He was found to have a large left kidney mass. The pathology revealed clear cell histology T3a N0. Imaging study did not show any evidence of metastatic disease.   Prior Therapy: He is status post laparoscopic left radical nephrectomy done on 06/30/2015. The pathology revealed clear cell histology nuclear grade 4 measuring 3.6 cm. The final pathological staging T3aNX.  Current therapy: Observation and surveillance.  Interim History: Joseph May presents today for a follow-up visit. Since his last visit, he no changes in his health. He underwent neck surgery on 01/31/2017. He recovered well at this time without pain or neurological symptoms. He continues to perform work-related duties without any decline. He denied any respiratory symptoms of cough or shortness of breath. Has not reported any hematuria or dysuria. Has not reported any flank pain. He does not report any bone pain or arthralgias.  He does not report any headaches, blurry vision, syncope or seizures. He has not reported any fevers, chills or sweats. Does not report any cough, hemoptysis or wheezing. Desirable any chest pain, palpitation, orthopnea or leg edema. Does not report any nausea, vomiting, diarrhea, constipation or change in his bowel habits. He does not report any frequency, urgency or hesitancy. Does not report any skeletal complaints of arthralgias or myalgias. Remaining review of systems unremarkable.    Medications: I have reviewed the patient's current medications.  Current Outpatient Prescriptions  Medication Sig Dispense Refill  . acetaminophen (TYLENOL) 500 MG tablet Take 2 tablets (1,000 mg total) by mouth every 6 (six) hours as needed for mild pain or moderate pain. 30 tablet 0   No  current facility-administered medications for this visit.      Allergies:  Allergies  Allergen Reactions  . No Known Allergies     Past Medical History, Surgical history, Social history, and Family History were reviewed and updated.   Physical Exam: Blood pressure (!) 153/72, pulse 70, temperature 98.2 F (36.8 C), temperature source Oral, resp. rate 20, height 6\' 1"  (1.854 m), weight 201 lb 8 oz (91.4 kg), SpO2 100 %. ECOG: 0 General appearance: well appearing man without distress. Head: Normocephalic, without obvious abnormality no oral ulcers or lesions Neck: no adenopathy or masses. Lymph nodes: Cervical, supraclavicular, and axillary nodes normal. Heart:regular rate and rhythm, S1, S2 normal, no murmur, click, rub or gallop Lung:chest clear, no wheezing, rales, normal symmetric air entry Abdomin: soft, non-tender, without masses or organomegaly no rebound or guarding. EXT:no erythema, induration, or nodules   Lab Results: Lab Results  Component Value Date   WBC 5.0 06/28/2017   HGB 14.4 06/28/2017   HCT 42.8 06/28/2017   MCV 89.4 06/28/2017   PLT 179 06/28/2017     Chemistry      Component Value Date/Time   NA 139 06/28/2017 0829   K 4.4 06/28/2017 0829   CL 104 01/29/2017 0939   CO2 27 06/28/2017 0829   BUN 15.2 06/28/2017 0829   CREATININE 1.3 06/28/2017 0829      Component Value Date/Time   CALCIUM 9.5 06/28/2017 0829   ALKPHOS 82 06/28/2017 0829   AST 15 06/28/2017 0829   ALT 19 06/28/2017 0829   BILITOT 0.57 06/28/2017 0829     EXAM: CT CHEST WITH CONTRAST  CT ABDOMEN AND PELVIS WITH AND WITHOUT CONTRAST  TECHNIQUE: Multidetector  CT imaging of the chest was performed during intravenous contrast administration. Multidetector CT imaging of the abdomen and pelvis was performed following the standard protocol before and during bolus administration of intravenous contrast.  CONTRAST:  182mL ISOVUE-300 IOPAMIDOL (ISOVUE-300) INJECTION  61%  COMPARISON:  12/21/2016  FINDINGS: CT CHEST FINDINGS  Cardiovascular: Apparent filling defect in the right jugular vein including on image 2/series 2 is most likely due to mixing phenomenon. Normal heart size, without pericardial effusion. No central pulmonary embolism, on this non-dedicated study.  Mediastinum/Nodes: No supraclavicular adenopathy. No mediastinal or hilar adenopathy.  Lungs/Pleura: No pleural fluid. Subpleural right lower lobe 4 mm nodule on image 83/series 4, not significantly changed since the prior and present back to 09/27/2015, most consistent with a benign etiology.  A left lower lobe subpleural pulmonary nodule measures 4 mm on image 106/series 4 and is also present back on 09/27/2015, benign.  Musculoskeletal: No acute osseous abnormality. Lower cervical spine fixation.  CT ABDOMEN AND PELVIS FINDINGS  Hepatobiliary: High left hepatic lobe low-density lesion measures 7 mm and is likely a cyst. No suspicious liver lesion. Normal gallbladder, without biliary ductal dilatation.  Pancreas: Normal, without mass or ductal dilatation.  Spleen: Normal in size, without focal abnormality.  Adrenals/Urinary Tract: Normal adrenal glands. Normal right kidney. Status post left nephrectomy, without locally recurrent disease. Normal urinary bladder.  Stomach/Bowel: Normal stomach, without wall thickening. Scattered colonic diverticula. Normal terminal ileum and appendix. Normal small bowel.  Vascular/Lymphatic: Aortic atherosclerosis. Duplicated IVC. No abdominopelvic adenopathy.  Reproductive: Normal prostate.  Other: No significant free fluid.  Musculoskeletal: No acute osseous abnormality.  IMPRESSION: 1. Status post left nephrectomy, without recurrent or metastatic disease. 2. Bilateral pulmonary nodules which are similar back to 09/27/2015 and can be considered benign. 3.  Aortic Atherosclerosis (ICD10-I70.0). 4.  Duplicated IVC. 5. Probable artifactual hypoattenuation within the right jugular vein. This could be correlated with physical exam and depending on clinical concern, ultrasound.     Impression and Plan:  56 year old gentleman with the following issues:  1. Renal cell carcinoma diagnosed in September 2016 after presenting with a large left kidney mass. He is status post left radical nephrectomy on 06/30/2015. The pathology revealed a 3.6 cm clear cell histology at T3a N0. He did not have any sarcomatoid features. He recovered well from the operation without any major complications.  CT scan on 07/01/2017 was reviewed today and discussed with the patient. No evidence of disease relapse noted.   The plan is to continue with active surveillance with repeat imaging studies in 6 months.   2. Peripheral neuropathy: continues to improve.  3. S/P anterior Cervical decompression completed on 01/31/2017. Fully recovered.   4. Follow-up: Will be in 6 months after repeat CT scan.   Zola Button, MD 10/11/20183:51 PM

## 2017-07-05 ENCOUNTER — Telehealth: Payer: Self-pay | Admitting: Oncology

## 2017-07-05 NOTE — Telephone Encounter (Signed)
Scheduled appt per 10/11 sch message - left message and sent reminder letter in the mail.

## 2017-11-06 ENCOUNTER — Telehealth: Payer: Self-pay | Admitting: Oncology

## 2017-11-06 NOTE — Telephone Encounter (Signed)
Patient needed to reschedule

## 2017-12-04 ENCOUNTER — Ambulatory Visit: Payer: BLUE CROSS/BLUE SHIELD | Admitting: Primary Care

## 2017-12-04 ENCOUNTER — Encounter: Payer: Self-pay | Admitting: Primary Care

## 2017-12-04 ENCOUNTER — Ambulatory Visit (INDEPENDENT_AMBULATORY_CARE_PROVIDER_SITE_OTHER)
Admission: RE | Admit: 2017-12-04 | Discharge: 2017-12-04 | Disposition: A | Discharge status: Home / Self Care | Payer: BLUE CROSS/BLUE SHIELD | Source: Ambulatory Visit | Attending: Primary Care | Admitting: Primary Care

## 2017-12-04 VITALS — BP 140/72 | HR 63 | Temp 97.8°F | Ht 73.0 in | Wt 202.5 lb

## 2017-12-04 DIAGNOSIS — R059 Cough, unspecified: Secondary | ICD-10-CM

## 2017-12-04 DIAGNOSIS — J069 Acute upper respiratory infection, unspecified: Secondary | ICD-10-CM

## 2017-12-04 DIAGNOSIS — J209 Acute bronchitis, unspecified: Secondary | ICD-10-CM | POA: Diagnosis not present

## 2017-12-04 DIAGNOSIS — R05 Cough: Secondary | ICD-10-CM | POA: Diagnosis not present

## 2017-12-04 DIAGNOSIS — R062 Wheezing: Secondary | ICD-10-CM

## 2017-12-04 MED ORDER — ALBUTEROL SULFATE HFA 108 (90 BASE) MCG/ACT IN AERS: 2.0000 | INHALATION_SPRAY | RESPIRATORY_TRACT | 0 refills | Status: DC | PRN

## 2017-12-04 MED ORDER — AZITHROMYCIN 250 MG PO TABS: ORAL_TABLET | ORAL | 0 refills | Status: DC

## 2017-12-04 NOTE — Patient Instructions (Addendum)
Your symptoms are from a upper respiratory infection that seems to be lingering.   We have prescribed you an albuterol inhaler. Take 2 puffs every 4 hours as need for wheezing and shortness of breath. Start taking an Antihistamine every day- generic Zyrtec, Allegra or Claritin Stop taking mucinex For you cough you can take over the counter medications like Delsym or Robitussin  Please stop and get your Chest Xray before leaving  It has been a pleasure seeing you today. Denita Lung, RN, Adult-Geriatric Nurse Practitioner Student and Allie Bossier, AGNP    How to Use a Metered Dose Inhaler A metered dose inhaler is a handheld device for taking medicine that must be breathed into the lungs (inhaled). The device can be used to deliver a variety of inhaled medicines, including:  Quick relief or rescue medicines, such as bronchodilators.  Controller medicines, such as corticosteroids.  The medicine is delivered by pushing down on a metal canister to release a preset amount of spray and medicine. Each device contains the amount of medicine that is needed for a preset number of uses (inhalations). Your health care provider may recommend that you use a spacer with your inhaler to help you take the medicine more effectively. A spacer is a plastic tube with a mouthpiece on one end and an opening that connects to the inhaler on the other end. A spacer holds the medicine in a tube for a short time, which allows you to inhale more medicine. What are the risks? If you do not use your inhaler correctly, medicine might not reach your lungs to help you breathe. Inhaler medicine can cause side effects, such as:  Mouth or throat infection.  Cough.  Hoarseness.  Headache.  Nausea and vomiting.  Lung infection (pneumonia) in people who have a lung condition called COPD.  How to use a metered dose inhaler without a spacer 1. Remove the cap from the inhaler. 2. If you are using the inhaler for  the first time, shake it for 5 seconds, turn it away from your face, then release 4 puffs into the air. This is called priming. 3. Shake the inhaler for 5 seconds. 4. Position the inhaler so the top of the canister faces up. 5. Put your index finger on the top of the medicine canister. Support the bottom of the inhaler with your thumb. 6. Breathe out normally and as completely as possible, away from the inhaler. 7. Either place the inhaler between your teeth and close your lips tightly around the mouthpiece, or hold the inhaler 1-2 inches (2.5-5 cm) away from your open mouth. Keep your tongue down out of the way. If you are unsure which technique to use, ask your health care provider. 8. Press the canister down with your index finger to release the medicine, then inhale deeply and slowly through your mouth (not your nose) until your lungs are completely filled. Inhaling should take 4-6 seconds. 9. Hold the medicine in your lungs for 5-10 seconds (10 seconds is best). This helps the medicine get into the small airways of your lungs. 10. With your lips in a tight circle (pursed), breathe out slowly. 11. Repeat steps 3-10 until you have taken the number of puffs that your health care provider directed. Wait about 1 minute between puffs or as directed. 12. Put the cap on the inhaler. 13. If you are using a steroid inhaler, rinse your mouth with water, gargle, and spit out the water. Do not swallow the water. How  to use a metered dose inhaler with a spacer 1. Remove the cap from the inhaler. 2. If you are using the inhaler for the first time, shake it for 5 seconds, turn it away from your face, then release 4 puffs into the air. This is called priming. 3. Shake the inhaler for 5 seconds. 4. Place the open end of the spacer onto the inhaler mouthpiece. 5. Position the inhaler so the top of the canister faces up and the spacer mouthpiece faces you. 6. Put your index finger on the top of the medicine  canister. Support the bottom of the inhaler and the spacer with your thumb. 7. Breathe out normally and as completely as possible, away from the spacer. 8. Place the spacer between your teeth and close your lips tightly around it. Keep your tongue down out of the way. 9. Press the canister down with your index finger to release the medicine, then inhale deeply and slowly through your mouth (not your nose) until your lungs are completely filled. Inhaling should take 4-6 seconds. 10. Hold the medicine in your lungs for 5-10 seconds (10 seconds is best). This helps the medicine get into the small airways of your lungs. 11. With your lips in a tight circle (pursed), breathe out slowly. 12. Repeat steps 3-11 until you have taken the number of puffs that your health care provider directed. Wait about 1 minute between puffs or as directed. 13. Remove the spacer from the inhaler and put the cap on the inhaler. 14. If you are using a steroid inhaler, rinse your mouth with water, gargle, and spit out the water. Do not swallow the water. Follow these instructions at home:  Take your inhaled medicine only as told by your health care provider. Do not use the inhaler more than directed by your health care provider.  Keep all follow-up visits as told by your health care provider. This is important.  If your inhaler has a counter, you can check it to determine how full your inhaler is. If your inhaler does not have a counter, ask your health care provider when you will need to refill your inhaler and write the refill date on a calendar or on your inhaler canister. Note that you cannot know when an inhaler is empty by shaking it.  Follow directions on the package insert for care and cleaning of your inhaler and spacer. Contact a health care provider if:  Symptoms are only partially relieved with your inhaler.  You are having trouble using your inhaler.  You have an increase in phlegm.  You have  headaches. Get help right away if:  You feel little or no relief after using your inhaler.  You have dizziness.  You have a fast heart rate.  You have chills or a fever.  You have night sweats.  There is blood in your phlegm. Summary  A metered dose inhaler is a handheld device for taking medicine that must be breathed into the lungs (inhaled).  The medicine is delivered by pushing down on a metal canister to release a preset amount of spray and medicine.  Each device contains the amount of medicine that is needed for a preset number of uses (inhalations). This information is not intended to replace advice given to you by your health care provider. Make sure you discuss any questions you have with your health care provider. Document Released: 09/10/2005 Document Revised: 07/31/2016 Document Reviewed: 07/31/2016 Elsevier Interactive Patient Education  2017 [employer]  Respiratory Infection, Adult Most upper respiratory infections (URIs) are caused by a virus. A URI affects the nose, throat, and upper air passages. The most common type of URI is often called "the common cold." Follow these instructions at home:  Take medicines only as told by your doctor.  Gargle warm saltwater or take cough drops to comfort your throat as told by your doctor.  Use a warm mist humidifier or inhale steam from a shower to increase air moisture. This may make it easier to breathe.  Drink enough fluid to keep your pee (urine) clear or pale yellow.  Eat soups and other clear broths.  Have a healthy diet.  Rest as needed.  Go back to work when your fever is gone or your doctor says it is okay. ? You may need to stay home longer to avoid giving your URI to others. ? You can also wear a face mask and wash your hands often to prevent spread of the virus.  Use your inhaler more if you have asthma.  Do not use any tobacco products, including cigarettes, chewing tobacco, or electronic  cigarettes. If you need help quitting, ask your doctor. Contact a doctor if:  You are getting worse, not better.  Your symptoms are not helped by medicine.  You have chills.  You are getting more short of breath.  You have brown or red mucus.  You have yellow or brown discharge from your nose.  You have pain in your face, especially when you bend forward.  You have a fever.  You have puffy (swollen) neck glands.  You have pain while swallowing.  You have white areas in the back of your throat. Get help right away if:  You have very bad or constant: ? Headache. ? Ear pain. ? Pain in your forehead, behind your eyes, and over your cheekbones (sinus pain). ? Chest pain.  You have long-lasting (chronic) lung disease and any of the following: ? Wheezing. ? Long-lasting cough. ? Coughing up blood. ? A change in your usual mucus.  You have a stiff neck.  You have changes in your: ? Vision. ? Hearing. ? Thinking. ? Mood. This information is not intended to replace advice given to you by your health care provider. Make sure you discuss any questions you have with your health care provider. Document Released: 02/27/2008 Document Revised: 05/13/2016 Document Reviewed: 12/16/2013 Elsevier Interactive Patient Education  2018 [employer].

## 2017-12-04 NOTE — Progress Notes (Signed)
Subjective:    Patient ID: Joseph May, male    DOB: [birth year], 57 y.o.   MRN: [id number]  HPI  [name] is a 57 year old male who presents today with a chief complaint of cough.  He also reports sore throat (initially), wheezing. His symptoms began three weeks ago and have wax and waned within that time span. He'll go several days without a cough, then will experience 1-2 days of cough. His cough is worse at night. He's been taking Mucinex-DM, Nyquil PM without much improvement. Overall he feels well and has been working without difficulty, but the cough is persistent. He denies fevers, chills, esophageal burning, epigastric pain, belching.  Review of Systems  Constitutional: Negative for fatigue and fever.  HENT: Positive for postnasal drip. Negative for congestion and sore throat.   Respiratory: Positive for cough and wheezing.   Cardiovascular: Negative for chest pain.  Gastrointestinal: Negative for nausea.       Past Medical History:  Diagnosis Date  . Arthritis   . Hemorrhoids   . Liver cyst    JUST WATCHING  . Renal cell cancer ([place])    [facility] 06/30/2015     Social History   Socioeconomic History  . Marital status: Married    Spouse name: Not on file  . Number of children: Not on file  . Years of education: Not on file  . Highest education level: Not on file  Social Needs  . Financial resource strain: Not on file  . Food insecurity - worry: Not on file  . Food insecurity - inability: Not on file  . Transportation needs - medical: Not on file  . Transportation needs - non-medical: Not on file  Occupational History  . Not on file  Tobacco Use  . Smoking status: Never Smoker  . Smokeless tobacco: Never Used  Substance and Sexual Activity  . Alcohol use: Yes    Alcohol/week: 0.0 oz    Comment: 2-3 beers at night  . Drug use: No  . Sexual activity: Not on file  Other Topics Concern  . Not on file  Social History Narrative   Married.  Lives in a one story  home.   Works as a Dance movement psychotherapist.   1 daughter.     Enjoys visiting family at the beach.     Past Surgical History:  Procedure Laterality Date  . ANTERIOR CERVICAL DECOMP/DISCECTOMY FUSION N/A 01/31/2017   Procedure: ANTERIOR CERVICAL DECOMPRESSION/DISCECTOMY FUSION CERVICAL FOUR - CERVICAL FIVE , CERVICAL SIX- CERVICAL SEVEN;  Surgeon: Ashok Pall, MD;  Location: [facility];  Service: Neurosurgery;  Laterality: N/A;  . CYSTOSCOPY WITH STENT PLACEMENT Left 06/07/2015   Procedure: CYSTOSCOPY WITH STENT PLACEMENT;  Surgeon: Ardis Hughs, MD;  Location: [facility];  Service: Urology;  Laterality: Left;  . LAPAROSCOPIC NEPHRECTOMY Left 06/30/2015   Procedure: LAPAROSCOPIC LEFT  NEPHRECTOMY;  Surgeon: Ardis Hughs, MD;  Location: [facility];  Service: Urology;  Laterality: Left;  . NECK SURGERY      Family History  Problem Relation Age of Onset  . Diabetes Mother   . Breast cancer Mother   . Liver disease Father   . Alcohol abuse Father        Deceased 28  . Healthy Brother   . Healthy Daughter     Allergies  Allergen Reactions  . No Known Allergies     Current Outpatient Medications on File Prior to Visit  Medication Sig Dispense Refill  . acetaminophen (  TYLENOL) 500 MG tablet Take 2 tablets (1,000 mg total) by mouth every 6 (six) hours as needed for mild pain or moderate pain. (Patient not taking: Reported on 12/04/2017) 30 tablet 0   No current facility-administered medications on file prior to visit.     BP 140/72   Pulse 63   Temp 97.8 F (36.6 C) (Oral)   Ht 6\' 1"  (1.854 m)   Wt 202 lb 8 oz (91.9 kg)   SpO2 99%   BMI 26.72 kg/m    Objective:   Physical Exam  Constitutional: He appears well-nourished.  HENT:  Right Ear: Tympanic membrane and ear canal normal.  Left Ear: Tympanic membrane and ear canal normal.  Nose: No mucosal edema. Right sinus exhibits no maxillary sinus tenderness and no frontal sinus tenderness. Left sinus exhibits no maxillary sinus  tenderness and no frontal sinus tenderness.  Mouth/Throat: Oropharynx is clear and moist.  Eyes: Conjunctivae are normal.  Neck: Neck supple.  Cardiovascular: Normal rate and regular rhythm.  Pulmonary/Chest: Effort normal. He has wheezes. He has no rales.  Skin: Skin is warm and dry.          Assessment & Plan:  Acute Bronchitis:  Cough, congestion, wheezing intermittently for 3 weeks. Exam today with mild wheezing, frequent dry cough. Normal vitals.  Chest xray today with acute bronchitis, no pneumonia.  Given duration of symptoms, will treat for potential early bacterial involvement as he's not improving.  Rx for Zpack sent to pharmacy. Use albuterol inhaler, antihistamine, Delsym.  Follow up PRN.  Pleas Koch, NP

## 2017-12-04 NOTE — Progress Notes (Signed)
Subjective:    Patient ID: DARTANYON FRANKOWSKI, male    DOB: 1961-06-12, 57 y.o.   MRN: [id number]  HPI SUMEET GETER is a 57 y.o. male who presents today for Cough. Symptoms have been on and off for 3 weeks now. His cough is worse at night and causes audible wheezing. Does have some DOE at work but quickly goes away with some deep breaths. Denies fever, chills, and chest pain.    Review of Systems  Constitutional: Negative for activity change and fatigue.  HENT: Positive for postnasal drip. Negative for congestion, sinus pressure, sinus pain and sore throat.   Respiratory: Negative for chest tightness.   Cardiovascular: Negative for chest pain.       Past Medical History:  Diagnosis Date  . Arthritis   . Hemorrhoids   . Liver cyst    JUST WATCHING  . Renal cell cancer ([place])    [place] 06/30/2015   Past Surgical History:  Procedure Laterality Date  . ANTERIOR CERVICAL DECOMP/DISCECTOMY FUSION N/A 01/31/2017   Procedure: ANTERIOR CERVICAL DECOMPRESSION/DISCECTOMY FUSION CERVICAL FOUR - CERVICAL FIVE , CERVICAL SIX- CERVICAL SEVEN;  Surgeon: Ashok Pall, MD;  Location: [place];  Service: Neurosurgery;  Laterality: N/A;  . CYSTOSCOPY WITH STENT PLACEMENT Left 06/07/2015   Procedure: CYSTOSCOPY WITH STENT PLACEMENT;  Surgeon: Ardis Hughs, MD;  Location: [facility];  Service: Urology;  Laterality: Left;  . LAPAROSCOPIC NEPHRECTOMY Left 06/30/2015   Procedure: LAPAROSCOPIC LEFT  NEPHRECTOMY;  Surgeon: Ardis Hughs, MD;  Location: [facility];  Service: Urology;  Laterality: Left;  . NECK SURGERY     Family History  Problem Relation Age of Onset  . Diabetes Mother   . Breast cancer Mother   . Liver disease Father   . Alcohol abuse Father        Deceased 69  . Healthy Brother   . Healthy Daughter    Social History   Socioeconomic History  . Marital status: Married    Spouse name: Not on file  . Number of children: Not on file  . Years of education: Not on file  . Highest education  level: Not on file  Social Needs  . Financial resource strain: Not on file  . Food insecurity - worry: Not on file  . Food insecurity - inability: Not on file  . Transportation needs - medical: Not on file  . Transportation needs - non-medical: Not on file  Occupational History  . Not on file  Tobacco Use  . Smoking status: Never Smoker  . Smokeless tobacco: Never Used  Substance and Sexual Activity  . Alcohol use: Yes    Alcohol/week: 0.0 oz    Comment: 2-3 beers at night  . Drug use: No  . Sexual activity: Not on file  Other Topics Concern  . Not on file  Social History Narrative   Married.  Lives in a one story home.   Works as a Dance movement psychotherapist.   1 daughter.     Enjoys visiting family at the beach.    Current Outpatient Medications on File Prior to Visit  Medication Sig Dispense Refill  . acetaminophen (TYLENOL) 500 MG tablet Take 2 tablets (1,000 mg total) by mouth every 6 (six) hours as needed for mild pain or moderate pain. (Patient not taking: Reported on 12/04/2017) 30 tablet 0   No current facility-administered medications on file prior to visit.     Objective:   Physical Exam  Constitutional: He appears well-nourished.  He does not appear ill.  HENT:  Right Ear: External ear normal.  Left Ear: External ear normal.  Nose: Nose normal. Right sinus exhibits no maxillary sinus tenderness and no frontal sinus tenderness. Left sinus exhibits no maxillary sinus tenderness and no frontal sinus tenderness.  Mouth/Throat: Mucous membranes are normal. No oropharyngeal exudate.  Neck: Neck supple.  Cardiovascular: Normal rate and normal heart sounds. Exam reveals no gallop and no friction rub.  No murmur heard. Pulmonary/Chest: Effort normal. No stridor. No respiratory distress. He has wheezes. He has no rales.  Lymphadenopathy:    He has no cervical adenopathy.    BP 140/72   Pulse 63   Temp 97.8 F (36.6 C) (Oral)   Ht 6\' 1"  (1.854 m)   Wt 202 lb 8 oz (91.9  kg)   SpO2 99%   BMI 26.72 kg/m       Assessment & Plan:  1. Cough - You can take over the counter medications like Delsym or Robitussin  2. Acute upper respiratory infection - ymptoms are from a upper respiratory infection that seems to be lingering. - DG Chest 2 View before leaving - albuterol (PROVENTIL HFA;VENTOLIN HFA) 108 (90 Base) MCG/ACT inhaler; Inhale 2 puffs into the lungs every 4 (four) hours as needed for wheezing or shortness of breath.  Dispense: 1 Inhaler; Refill: 0  3. Wheezing on both sides of chest - albuterol (PROVENTIL HFA;VENTOLIN HFA) 108 (90 Base) MCG/ACT inhaler; Inhale 2 puffs into the lungs every 4 (four) hours as needed for wheezing or shortness of breath.  Dispense: 1 Inhaler; Refill: 0  Denita Lung, RN, Adult-Geriatric Nurse Practitioner Student

## 2017-12-09 ENCOUNTER — Encounter: Payer: Self-pay | Admitting: Primary Care

## 2017-12-09 DIAGNOSIS — J209 Acute bronchitis, unspecified: Secondary | ICD-10-CM

## 2017-12-13 MED ORDER — GUAIFENESIN-CODEINE 100-10 MG/5ML PO SYRP: 5.0000 mL | ORAL_SOLUTION | Freq: Three times a day (TID) | ORAL | 0 refills | Status: DC | PRN

## 2017-12-13 MED ORDER — PREDNISONE 20 MG PO TABS: ORAL_TABLET | ORAL | 0 refills | Status: DC

## 2017-12-26 ENCOUNTER — Telehealth: Payer: Self-pay | Admitting: *Deleted

## 2017-12-26 NOTE — Telephone Encounter (Signed)
Patient called and needs a lab scheduled prior to his CT scan on Tuesday, April, 16th at 8 am. Lab can be scheduled on April 9th or later. He would like the labs (which are needed for CT scan) done at [facility], [place]. His return number is [phone].

## 2018-01-01 ENCOUNTER — Telehealth: Payer: Self-pay | Admitting: *Deleted

## 2018-01-01 NOTE — Telephone Encounter (Signed)
Per Butch Penny, they got it straightened out and he got his labs drawn

## 2018-01-01 NOTE — Telephone Encounter (Signed)
I don't see a problem getting his labs drawn at central lab in the main hospital.  The [facility] lab volume is maxed out most days with active patients.

## 2018-01-01 NOTE — Telephone Encounter (Signed)
Patient is being treated at [facility] for cancer by Dr Alen Blew and wants to get his labs drawn at [facility]. He gets his scans done here, Butch Penny states she has been getting the runaaround regarding him getting labs here and would like an answer of whether he can get them here or not. Please advise

## 2018-01-02 ENCOUNTER — Ambulatory Visit: Payer: BLUE CROSS/BLUE SHIELD | Admitting: Oncology

## 2018-01-03 ENCOUNTER — Other Ambulatory Visit
Admission: RE | Admit: 2018-01-03 | Discharge: 2018-01-03 | Disposition: A | Discharge status: Home / Self Care | Payer: BLUE CROSS/BLUE SHIELD | Source: Ambulatory Visit | Attending: Oncology | Admitting: Oncology

## 2018-01-03 ENCOUNTER — Ambulatory Visit: Payer: BLUE CROSS/BLUE SHIELD | Admitting: Oncology

## 2018-01-03 DIAGNOSIS — C649 Malignant neoplasm of unspecified kidney, except renal pelvis: Secondary | ICD-10-CM

## 2018-01-03 LAB — CBC WITH DIFFERENTIAL/PLATELET
BASOS PCT: 1 %
Basophils Absolute: 0 10*3/uL (ref 0–0.1)
Eosinophils Absolute: 0.1 10*3/uL (ref 0–0.7)
Eosinophils Relative: 3 %
HEMATOCRIT: 41.4 % (ref 40.0–52.0)
HEMOGLOBIN: 14.4 g/dL (ref 13.0–18.0)
LYMPHS ABS: 1 10*3/uL (ref 1.0–3.6)
LYMPHS PCT: 22 %
MCH: 30.8 pg (ref 26.0–34.0)
MCHC: 34.8 g/dL (ref 32.0–36.0)
MCV: 88.4 fL (ref 80.0–100.0)
MONO ABS: 0.4 10*3/uL (ref 0.2–1.0)
MONOS PCT: 10 %
NEUTROS ABS: 2.9 10*3/uL (ref 1.4–6.5)
Neutrophils Relative %: 64 %
Platelets: 179 10*3/uL (ref 150–440)
RBC: 4.68 MIL/uL (ref 4.40–5.90)
RDW: 13.1 % (ref 11.5–14.5)
WBC: 4.5 10*3/uL (ref 3.8–10.6)

## 2018-01-03 LAB — COMPREHENSIVE METABOLIC PANEL
ALT: 20 U/L (ref 17–63)
ANION GAP: 6 (ref 5–15)
AST: 22 U/L (ref 15–41)
Albumin: 4.3 g/dL (ref 3.5–5.0)
Alkaline Phosphatase: 76 U/L (ref 38–126)
BUN: 16 mg/dL (ref 6–20)
CHLORIDE: 102 mmol/L (ref 101–111)
CO2: 28 mmol/L (ref 22–32)
CREATININE: 1.34 mg/dL — AB (ref 0.61–1.24)
Calcium: 9 mg/dL (ref 8.9–10.3)
GFR, EST NON AFRICAN AMERICAN: 59 mL/min — AB (ref >60)
Glucose, Bld: 104 mg/dL — ABNORMAL HIGH (ref 65–99)
POTASSIUM: 3.8 mmol/L (ref 3.5–5.1)
SODIUM: 136 mmol/L (ref 135–145)
Total Bilirubin: 1 mg/dL (ref 0.3–1.2)
Total Protein: 7.5 g/dL (ref 6.5–8.1)

## 2018-01-07 ENCOUNTER — Ambulatory Visit: Admission: RE | Admit: 2018-01-07 | Payer: BLUE CROSS/BLUE SHIELD | Source: Ambulatory Visit

## 2018-01-09 ENCOUNTER — Ambulatory Visit
Admission: RE | Admit: 2018-01-09 | Discharge: 2018-01-09 | Disposition: A | Discharge status: Home / Self Care | Payer: BLUE CROSS/BLUE SHIELD | Source: Ambulatory Visit | Attending: Oncology | Admitting: Oncology

## 2018-01-09 DIAGNOSIS — C649 Malignant neoplasm of unspecified kidney, except renal pelvis: Secondary | ICD-10-CM | POA: Diagnosis present

## 2018-01-09 DIAGNOSIS — R918 Other nonspecific abnormal finding of lung field: Secondary | ICD-10-CM | POA: Diagnosis not present

## 2018-01-09 DIAGNOSIS — Z85528 Personal history of other malignant neoplasm of kidney: Secondary | ICD-10-CM | POA: Diagnosis not present

## 2018-01-09 DIAGNOSIS — Z08 Encounter for follow-up examination after completed treatment for malignant neoplasm: Secondary | ICD-10-CM | POA: Insufficient documentation

## 2018-01-09 DIAGNOSIS — Z905 Acquired absence of kidney: Secondary | ICD-10-CM | POA: Insufficient documentation

## 2018-01-09 DIAGNOSIS — C642 Malignant neoplasm of left kidney, except renal pelvis: Secondary | ICD-10-CM | POA: Diagnosis not present

## 2018-01-09 DIAGNOSIS — I700 Atherosclerosis of aorta: Secondary | ICD-10-CM | POA: Diagnosis not present

## 2018-01-09 MED ORDER — IOPAMIDOL (ISOVUE-300) INJECTION 61%
100.0000 mL | Freq: Once | INTRAVENOUS | Status: AC | PRN
  Administered 2018-01-09: 100 mL via INTRAVENOUS

## 2018-01-14 ENCOUNTER — Telehealth: Payer: Self-pay | Admitting: Oncology

## 2018-01-14 ENCOUNTER — Inpatient Hospital Stay: Payer: BLUE CROSS/BLUE SHIELD | Attending: Oncology | Admitting: Oncology

## 2018-01-14 VITALS — BP 152/79 | HR 66 | Temp 98.1°F | Resp 18 | Ht 73.0 in | Wt 203.7 lb

## 2018-01-14 DIAGNOSIS — I700 Atherosclerosis of aorta: Secondary | ICD-10-CM | POA: Diagnosis not present

## 2018-01-14 DIAGNOSIS — Z79899 Other long term (current) drug therapy: Secondary | ICD-10-CM | POA: Diagnosis not present

## 2018-01-14 DIAGNOSIS — G629 Polyneuropathy, unspecified: Secondary | ICD-10-CM | POA: Insufficient documentation

## 2018-01-14 DIAGNOSIS — Z905 Acquired absence of kidney: Secondary | ICD-10-CM

## 2018-01-14 DIAGNOSIS — R918 Other nonspecific abnormal finding of lung field: Secondary | ICD-10-CM | POA: Insufficient documentation

## 2018-01-14 DIAGNOSIS — C649 Malignant neoplasm of unspecified kidney, except renal pelvis: Secondary | ICD-10-CM | POA: Diagnosis not present

## 2018-01-14 NOTE — Telephone Encounter (Signed)
Appointments scheduled AVS/Calendar printed/ Contrast provided and patient will be scheduled @ [place] for labs & CT per 4/23 los

## 2018-01-14 NOTE — Progress Notes (Signed)
Hematology and Oncology Follow Up Visit  Joseph May [id number] 07/15/1961 57 y.o. 01/14/2018 3:49 PM Joseph May, MDClark, Joseph Cruise, MD   Principle Diagnosis: 57 year old with T3N0 clear cell renal cell carcinoma diagnosed in September 2016.    Prior Therapy: He is status post laparoscopic left radical nephrectomy done on 06/30/2015. The pathology revealed clear cell histology nuclear grade 4 measuring 3.6 cm.   Current therapy: Observation and surveillance.  Interim History: Joseph May presents today for a follow-up visit.  He reports no major changes in his health since the last visit.  He continues to be active and attends activities of daily living.  He has had neuropathy in his leg remained unchanged.  He does not report any abdominal pain or hematuria.  He denies any flank pain.  He has fully recovered from his operation on his cervical spine in May 2018.  He does not report any headaches, blurry vision, syncope or seizures. He has not reported any fevers, chills or sweats. Does not report any cough, hemoptysis or wheezing. Desirable any chest pain, palpitation, orthopnea or leg edema. Does not report any nausea, vomiting, diarrhea, constipation or change in his bowel habits. He does not report any frequency, urgency or hesitancy. Does not report any skeletal complaints of arthralgias or myalgias. Remaining review of systems is negative.    Medications: I have reviewed the patient's current medications.  Current Outpatient Medications  Medication Sig Dispense Refill  . acetaminophen (TYLENOL) 500 MG tablet Take 2 tablets (1,000 mg total) by mouth every 6 (six) hours as needed for mild pain or moderate pain. (Patient not taking: Reported on 12/04/2017) 30 tablet 0  . albuterol (PROVENTIL HFA;VENTOLIN HFA) 108 (90 Base) MCG/ACT inhaler Inhale 2 puffs into the lungs every 4 (four) hours as needed for wheezing or shortness of breath. 1 Inhaler 0  . azithromycin (ZITHROMAX) 250 MG  tablet Take 2 tablets by mouth today, then 1 tablet daily for 4 additional days. 6 tablet 0  . guaiFENesin-codeine (ROBITUSSIN AC) 100-10 MG/5ML syrup Take 5 mLs by mouth 3 (three) times daily as needed for cough. This may cause drowsiness. 75 mL 0  . predniSONE (DELTASONE) 20 MG tablet Take 2 tablets daily for 5 days. 10 tablet 0   No current facility-administered medications for this visit.      Allergies:  Allergies  Allergen Reactions  . No Known Allergies     Past Medical History, Surgical history, Social history, and Family History were reviewed and updated.   Physical Exam: Blood pressure (!) 152/79, pulse 66, temperature 98.1 F (36.7 C), temperature source Oral, resp. rate 18, height 6\' 1"  (1.854 m), weight 203 lb 11.2 oz (92.4 kg), SpO2 100 %.   ECOG: 0 General appearance: Alert, awake gentleman without distress. Head: Atraumatic without abnormalities. Oropharynx: No thrush or ulcers. Eyes: No scleral icterus. Lymph nodes: No lymphadenopathy noted in the cervical, supraclavicular, and axillary regions. Heart: Regular rate and rhythm without any murmurs or gallops. Lung: Clear to auscultation without any rhonchi, wheezes or dullness to percussion. Abdomin: Soft, nontender without any rebound or guarding. Musculoskeletal: No joint deformity or effusion. Neurological: No motor or sensory deficits.   Lab Results: Lab Results  Component Value Date   WBC 4.5 01/03/2018   HGB 14.4 01/03/2018   HCT 41.4 01/03/2018   MCV 88.4 01/03/2018   PLT 179 01/03/2018     Chemistry      Component Value Date/Time   NA 136 01/03/2018 0834   NA 139  06/28/2017 0829   K 3.8 01/03/2018 0834   K 4.4 06/28/2017 0829   CL 102 01/03/2018 0834   CO2 28 01/03/2018 0834   CO2 27 06/28/2017 0829   BUN 16 01/03/2018 0834   BUN 15.2 06/28/2017 0829   CREATININE 1.34 (H) 01/03/2018 0834   CREATININE 1.3 06/28/2017 0829      Component Value Date/Time   CALCIUM 9.0 01/03/2018 0834    CALCIUM 9.5 06/28/2017 0829   ALKPHOS 76 01/03/2018 0834   ALKPHOS 82 06/28/2017 0829   AST 22 01/03/2018 0834   AST 15 06/28/2017 0829   ALT 20 01/03/2018 0834   ALT 19 06/28/2017 0829   BILITOT 1.0 01/03/2018 0834   BILITOT 0.57 06/28/2017 0829     EXAM: CT CHEST, ABDOMEN, AND PELVIS WITH CONTRAST  TECHNIQUE: Multidetector CT imaging of the chest, abdomen and pelvis was performed following the standard protocol during bolus administration of intravenous contrast.  CONTRAST:  13mL ISOVUE-300 IOPAMIDOL (ISOVUE-300) INJECTION 61%  COMPARISON:  07/01/2017  FINDINGS: CT CHEST FINDINGS  Cardiovascular: Normal heart size.  No pericardial effusion.  Mediastinum/Nodes: No enlarged mediastinal, hilar, or axillary lymph nodes. Thyroid gland, trachea, and esophagus demonstrate no significant findings.  Lungs/Pleura: No pleural effusion. Previously characterized benign nodules are unchanged from previous exam. No new pulmonary nodularity to suggest metastatic disease.  Musculoskeletal: Spondylosis is identified within the thoracic spine. No suspicious bone lesions identified.  CT ABDOMEN PELVIS FINDINGS  Hepatobiliary: No focal liver abnormality is seen. No gallstones, gallbladder wall thickening, or biliary dilatation.  Pancreas: Unremarkable. No pancreatic ductal dilatation or surrounding inflammatory changes.  Spleen: Normal in size without focal abnormality.  Adrenals/Urinary Tract: Normal appearance of the adrenal glands. Right kidney is unremarkable. No hydronephrosis or mass. Previous left nephrectomy. No suspicious nodularity within the nephrectomy bed to suggest recurrence of tumor. Urinary bladder appears normal.  Stomach/Bowel: Normal appearance of the stomach. The small bowel loops have a normal course and caliber. The appendix is visualized and appears normal. No pathologic dilatation of the colon.  Vascular/Lymphatic: Normal appearance of  the abdominal aorta. Duplicated IVC. No abdominal or pelvic adenopathy.  Reproductive: Prostate is unremarkable.  Other: No abdominal wall hernia or abnormality. No abdominopelvic ascites.  Musculoskeletal: Degenerative disc disease identified within the lumbar spine.  IMPRESSION: 1. Status post left nephrectomy without recurrent or metastatic disease. 2. Stable appearance of previously characterized benign pulmonary nodules. 3.  Aortic Atherosclerosis (ICD10-I70.0).   Impression and Plan:  57 year old gentleman with the following issues:  1.  T3a N0 renal cell carcinoma diagnosed in September 2016.  He is status post left radical nephrectomy on 06/30/2015 without any evidence of recurrent disease since that time.  He remains on active surveillance after declining adjuvant therapy.  Imaging studies obtained in January 09, 2018 were personally reviewed and discussed with the patient today.  He has no evidence to suggest disease relapse and I have recommended continue active surveillance for the time being.  We will repeat imaging studies in 6 months.   2. Peripheral neuropathy: Improved since the last visit and reasonably manageable overall.  3. S/P anterior Cervical decompression completed on 01/31/2017.  No complications or residual pain noted.  4. Follow-up: Will be in 6 months after obtaining laboratory testing and CT scan.  15  minutes was spent with the patient face-to-face today.  More than 50% of time was dedicated to patient counseling, education and coordination of his care.   Zola Button, MD 4/23/20193:49 PM

## 2018-03-03 DIAGNOSIS — D2260 Melanocytic nevi of unspecified upper limb, including shoulder: Secondary | ICD-10-CM | POA: Diagnosis not present

## 2018-03-03 DIAGNOSIS — L570 Actinic keratosis: Secondary | ICD-10-CM | POA: Diagnosis not present

## 2018-03-03 DIAGNOSIS — D225 Melanocytic nevi of trunk: Secondary | ICD-10-CM | POA: Diagnosis not present

## 2018-03-03 DIAGNOSIS — Z1283 Encounter for screening for malignant neoplasm of skin: Secondary | ICD-10-CM | POA: Diagnosis not present

## 2018-03-03 DIAGNOSIS — D2230 Melanocytic nevi of unspecified part of face: Secondary | ICD-10-CM | POA: Diagnosis not present

## 2018-03-03 DIAGNOSIS — D485 Neoplasm of uncertain behavior of skin: Secondary | ICD-10-CM | POA: Diagnosis not present

## 2018-06-09 ENCOUNTER — Telehealth: Payer: Self-pay | Admitting: Oncology

## 2018-06-09 NOTE — Telephone Encounter (Signed)
Returned vmail and schedule message 9/16 - left message for patient to call back to r/s 10/29 per request.

## 2018-06-16 ENCOUNTER — Other Ambulatory Visit: Payer: Self-pay | Admitting: *Deleted

## 2018-06-16 DIAGNOSIS — C649 Malignant neoplasm of unspecified kidney, except renal pelvis: Secondary | ICD-10-CM

## 2018-06-19 ENCOUNTER — Ambulatory Visit (INDEPENDENT_AMBULATORY_CARE_PROVIDER_SITE_OTHER)
Admission: RE | Admit: 2018-06-19 | Discharge: 2018-06-19 | Disposition: A | Discharge status: Home / Self Care | Payer: BLUE CROSS/BLUE SHIELD | Source: Ambulatory Visit | Attending: Primary Care | Admitting: Primary Care

## 2018-06-19 ENCOUNTER — Encounter: Payer: Self-pay | Admitting: Primary Care

## 2018-06-19 ENCOUNTER — Ambulatory Visit: Payer: BLUE CROSS/BLUE SHIELD | Admitting: Primary Care

## 2018-06-19 VITALS — BP 136/82 | HR 61 | Temp 98.1°F | Ht 70.0 in | Wt 203.8 lb

## 2018-06-19 DIAGNOSIS — R059 Cough, unspecified: Secondary | ICD-10-CM

## 2018-06-19 DIAGNOSIS — R062 Wheezing: Secondary | ICD-10-CM

## 2018-06-19 DIAGNOSIS — R05 Cough: Secondary | ICD-10-CM

## 2018-06-19 DIAGNOSIS — J3089 Other allergic rhinitis: Secondary | ICD-10-CM | POA: Diagnosis not present

## 2018-06-19 MED ORDER — FLUTICASONE PROPIONATE HFA 110 MCG/ACT IN AERO: 1.0000 | INHALATION_SPRAY | Freq: Two times a day (BID) | RESPIRATORY_TRACT | 0 refills | Status: DC

## 2018-06-19 MED ORDER — ALBUTEROL SULFATE HFA 108 (90 BASE) MCG/ACT IN AERS: 2.0000 | INHALATION_SPRAY | RESPIRATORY_TRACT | 0 refills | Status: DC | PRN

## 2018-06-19 MED ORDER — ALBUTEROL SULFATE (2.5 MG/3ML) 0.083% IN NEBU
2.5000 mg | INHALATION_SOLUTION | Freq: Once | RESPIRATORY_TRACT | Status: AC
  Administered 2018-06-19: 2.5 mg via RESPIRATORY_TRACT

## 2018-06-19 NOTE — Patient Instructions (Signed)
Start fluticasone 110 mcg inhaler. Inhale 1 puff into the lungs twice daily.  Use the albuterol inhaler as needed for wheezing, shortness of breath, cough.  Start an antihistamine like Zyrtec once nightly at bedtime.  Complete xray(s) prior to leaving today. I will notify you of your results once received.  Please schedule a follow up appointment in 1 month.  It was a pleasure to see you today!

## 2018-06-19 NOTE — Assessment & Plan Note (Signed)
Symptoms consistent with both seasonal/environmental allergies and asthma caused by irritants. Spirometry preformed today and unfortunately his pre bronchodilator test was lost between pre and post testing.   Symptoms don't sound like silent reflux, especially given the wheezing. Given improvement with SABA at home, will start with low dose ICS daily and then SABA as needed. Start Zyrtec nightly.   We will see him back in the office in one month for follow up.

## 2018-06-19 NOTE — Progress Notes (Signed)
Subjective:    Patient ID: Joseph May, male    DOB: [birth year], 57 y.o.   MRN: [id number]  HPI  Joseph May is a 57 year old male with a history of renal cell carcinoma, herniated nucleus pulposus to the cervical spine who presents today with a chief complaint of wheezing.  He also reports non productive cough, rhinorrhea, sneezing. He's been using an albuterol inhaler that was prescribed in March 2019 from acute bronchitis. He's been using this once weekly on average since, more frequently over the last three weeks with seasonal changes. Symptoms over the last three weeks include wheezing and cough, mostly during the night but also during the evening after work. He works in a [employer] and is surrounded by machinery that uses chemicals.   He does feel better after using his inhaler. He denies esophageal burning, epigastric pain, belching, post nasal drip. He's not taken anything OTC for symptoms. He has never smoked and has never been diagnosed with asthma.   Review of Systems  Constitutional: Negative for chills and fever.  HENT: Positive for rhinorrhea. Negative for congestion, ear pain, postnasal drip, sinus pressure and sore throat.   Respiratory: Positive for cough, shortness of breath and wheezing.   Cardiovascular: Negative for chest pain.  Allergic/Immunologic: Positive for environmental allergies.       Past Medical History:  Diagnosis Date  . Arthritis   . Hemorrhoids   . Liver cyst    JUST WATCHING  . Renal cell cancer ([place])    [place] 06/30/2015     Social History   Socioeconomic History  . Marital status: Married    Spouse name: Not on file  . Number of children: Not on file  . Years of education: Not on file  . Highest education level: Not on file  Occupational History  . Not on file  Social Needs  . Financial resource strain: Not on file  . Food insecurity:    Worry: Not on file    Inability: Not on file  . Transportation needs:    Medical: Not  on file    Non-medical: Not on file  Tobacco Use  . Smoking status: Never Smoker  . Smokeless tobacco: Never Used  Substance and Sexual Activity  . Alcohol use: Yes    Alcohol/week: 0.0 standard drinks    Comment: 2-3 beers at night  . Drug use: No  . Sexual activity: Not on file  Lifestyle  . Physical activity:    Days per week: Not on file    Minutes per session: Not on file  . Stress: Not on file  Relationships  . Social connections:    Talks on phone: Not on file    Gets together: Not on file    Attends religious service: Not on file    Active member of club or organization: Not on file    Attends meetings of clubs or organizations: Not on file    Relationship status: Not on file  . Intimate partner violence:    Fear of current or ex partner: Not on file    Emotionally abused: Not on file    Physically abused: Not on file    Forced sexual activity: Not on file  Other Topics Concern  . Not on file  Social History Narrative   Married.  Lives in a one story home.   Works as a Dance movement psychotherapist.   1 daughter.     Enjoys visiting family at the beach.  Past Surgical History:  Procedure Laterality Date  . ANTERIOR CERVICAL DECOMP/DISCECTOMY FUSION N/A 01/31/2017   Procedure: ANTERIOR CERVICAL DECOMPRESSION/DISCECTOMY FUSION CERVICAL FOUR - CERVICAL FIVE , CERVICAL SIX- CERVICAL SEVEN;  Surgeon: Ashok Pall, MD;  Location: [place];  Service: Neurosurgery;  Laterality: N/A;  . CYSTOSCOPY WITH STENT PLACEMENT Left 06/07/2015   Procedure: CYSTOSCOPY WITH STENT PLACEMENT;  Surgeon: Ardis Hughs, MD;  Location: [facility];  Service: Urology;  Laterality: Left;  . LAPAROSCOPIC NEPHRECTOMY Left 06/30/2015   Procedure: LAPAROSCOPIC LEFT  NEPHRECTOMY;  Surgeon: Ardis Hughs, MD;  Location: [facility];  Service: Urology;  Laterality: Left;  . NECK SURGERY      Family History  Problem Relation Age of Onset  . Diabetes Mother   . Breast cancer Mother   . Liver disease  Father   . Alcohol abuse Father        Deceased 66  . Healthy Brother   . Healthy Daughter     Allergies  Allergen Reactions  . No Known Allergies     Current Outpatient Medications on File Prior to Visit  Medication Sig Dispense Refill  . acetaminophen (TYLENOL) 500 MG tablet Take 2 tablets (1,000 mg total) by mouth every 6 (six) hours as needed for mild pain or moderate pain. 30 tablet 0   No current facility-administered medications on file prior to visit.     BP 136/82   Pulse 61   Temp 98.1 F (36.7 C) (Oral)   Ht 5\' 10"  (1.778 m)   Wt 203 lb 12 oz (92.4 kg)   SpO2 98%   BMI 29.24 kg/m    Objective:   Physical Exam  Constitutional: He appears well-nourished. He does not appear ill.  HENT:  Right Ear: Tympanic membrane and ear canal normal.  Left Ear: Tympanic membrane and ear canal normal.  Nose: No mucosal edema. Right sinus exhibits no maxillary sinus tenderness and no frontal sinus tenderness. Left sinus exhibits no maxillary sinus tenderness and no frontal sinus tenderness.  Mouth/Throat: Oropharynx is clear and moist.  Neck: Neck supple.  Cardiovascular: Normal rate and regular rhythm.  Respiratory: Effort normal and breath sounds normal. He has no wheezes.  Skin: Skin is warm and dry.           Assessment & Plan:

## 2018-07-10 ENCOUNTER — Other Ambulatory Visit
Admission: RE | Admit: 2018-07-10 | Discharge: 2018-07-10 | Disposition: A | Discharge status: Home / Self Care | Payer: BLUE CROSS/BLUE SHIELD | Source: Ambulatory Visit | Attending: Oncology | Admitting: Oncology

## 2018-07-10 DIAGNOSIS — C649 Malignant neoplasm of unspecified kidney, except renal pelvis: Secondary | ICD-10-CM | POA: Insufficient documentation

## 2018-07-10 LAB — CBC
HEMATOCRIT: 41.1 % (ref 39.0–52.0)
Hemoglobin: 13.6 g/dL (ref 13.0–17.0)
MCH: 30.4 pg (ref 26.0–34.0)
MCHC: 33.1 g/dL (ref 30.0–36.0)
MCV: 91.7 fL (ref 80.0–100.0)
Platelets: 184 10*3/uL (ref 150–400)
RBC: 4.48 MIL/uL (ref 4.22–5.81)
RDW: 12.8 % (ref 11.5–15.5)
WBC: 5.4 10*3/uL (ref 4.0–10.5)
nRBC: 0 % (ref 0.0–0.2)

## 2018-07-10 LAB — COMPREHENSIVE METABOLIC PANEL
ALT: 23 U/L (ref 0–44)
ANION GAP: 8 (ref 5–15)
AST: 24 U/L (ref 15–41)
Albumin: 4.1 g/dL (ref 3.5–5.0)
Alkaline Phosphatase: 78 U/L (ref 38–126)
BILIRUBIN TOTAL: 0.7 mg/dL (ref 0.3–1.2)
BUN: 15 mg/dL (ref 6–20)
CO2: 29 mmol/L (ref 22–32)
Calcium: 8.9 mg/dL (ref 8.9–10.3)
Chloride: 102 mmol/L (ref 98–111)
Creatinine, Ser: 1.3 mg/dL — ABNORMAL HIGH (ref 0.61–1.24)
GFR calc Af Amer: >60 mL/min (ref >60)
Glucose, Bld: 110 mg/dL — ABNORMAL HIGH (ref 70–99)
POTASSIUM: 4.4 mmol/L (ref 3.5–5.1)
Sodium: 139 mmol/L (ref 135–145)
TOTAL PROTEIN: 7.4 g/dL (ref 6.5–8.1)

## 2018-07-11 ENCOUNTER — Other Ambulatory Visit: Payer: Self-pay | Admitting: Oncology

## 2018-07-11 DIAGNOSIS — C649 Malignant neoplasm of unspecified kidney, except renal pelvis: Secondary | ICD-10-CM

## 2018-07-14 ENCOUNTER — Ambulatory Visit
Admission: RE | Admit: 2018-07-14 | Discharge: 2018-07-14 | Disposition: A | Discharge status: Home / Self Care | Payer: BLUE CROSS/BLUE SHIELD | Source: Ambulatory Visit | Attending: Oncology | Admitting: Oncology

## 2018-07-14 DIAGNOSIS — C649 Malignant neoplasm of unspecified kidney, except renal pelvis: Secondary | ICD-10-CM

## 2018-07-14 DIAGNOSIS — I700 Atherosclerosis of aorta: Secondary | ICD-10-CM | POA: Diagnosis not present

## 2018-07-14 DIAGNOSIS — R918 Other nonspecific abnormal finding of lung field: Secondary | ICD-10-CM | POA: Diagnosis not present

## 2018-07-14 DIAGNOSIS — K7689 Other specified diseases of liver: Secondary | ICD-10-CM | POA: Diagnosis not present

## 2018-07-14 DIAGNOSIS — Z85528 Personal history of other malignant neoplasm of kidney: Secondary | ICD-10-CM | POA: Insufficient documentation

## 2018-07-14 DIAGNOSIS — Z905 Acquired absence of kidney: Secondary | ICD-10-CM | POA: Insufficient documentation

## 2018-07-14 MED ORDER — IOPAMIDOL (ISOVUE-300) INJECTION 61%
100.0000 mL | Freq: Once | INTRAVENOUS | Status: AC | PRN
  Administered 2018-07-14: 100 mL via INTRAVENOUS

## 2018-07-15 DIAGNOSIS — L905 Scar conditions and fibrosis of skin: Secondary | ICD-10-CM | POA: Diagnosis not present

## 2018-07-15 DIAGNOSIS — D225 Melanocytic nevi of trunk: Secondary | ICD-10-CM | POA: Diagnosis not present

## 2018-07-21 ENCOUNTER — Ambulatory Visit: Payer: BLUE CROSS/BLUE SHIELD | Admitting: Primary Care

## 2018-07-21 ENCOUNTER — Encounter: Payer: Self-pay | Admitting: Primary Care

## 2018-07-21 DIAGNOSIS — J3089 Other allergic rhinitis: Secondary | ICD-10-CM

## 2018-07-21 NOTE — Assessment & Plan Note (Signed)
Significantly improved after use of Flovent inhaler daily, continue same. No recent use of albuterol. Suspect allergies to be cause. Will have him update if symptoms return.

## 2018-07-21 NOTE — Patient Instructions (Signed)
Continue using Flovent inhaler once to twice daily, everyday. Be sure to rinse your mouth with water. Use the albuterol inhaler as needed.  Please notify your pharmacy when needing refills.   It was a pleasure to see you today!

## 2018-07-21 NOTE — Progress Notes (Signed)
Subjective:    Patient ID: Joseph May, male    DOB: [birth year], 57 y.o.   MRN: [id number]  HPI  [name] is a 57 year old male who presents today for follow up of wheezing, chronic cough, and allergies. He was last evaluated in late September with reports of symptoms listed above for which he's had since March 2019 after bronchitis diagnosis. He had noticed temporary improvement after using an albuterol inhaler. He also noted that he works around a lot of chemicals during his work day, thinks this may be contributing. Spirometry was attempted last visit but the machine malfunctioned. He was treated with Flovent daily and albuterol as needed.  Since his last visit his symptoms have nearly resolved. He's been compliant to his Flovent most everyday, once daily. He's had no use of his albuterol inhaler. He is not using Zyrtec. He denies wheezing, congestion, fevers. He does have an occasional cough but this has significantly improved. He rinses his mouth daily after using Flovent.   Review of Systems  Constitutional: Negative for fever.  Respiratory: Negative for cough and shortness of breath.   Cardiovascular: Negative for chest pain.       Past Medical History:  Diagnosis Date  . Arthritis   . Hemorrhoids   . Liver cyst    JUST WATCHING  . Renal cell cancer ([place])    [place] 06/30/2015     Social History   Socioeconomic History  . Marital status: Married    Spouse name: Not on file  . Number of children: Not on file  . Years of education: Not on file  . Highest education level: Not on file  Occupational History  . Not on file  Social Needs  . Financial resource strain: Not on file  . Food insecurity:    Worry: Not on file    Inability: Not on file  . Transportation needs:    Medical: Not on file    Non-medical: Not on file  Tobacco Use  . Smoking status: Never Smoker  . Smokeless tobacco: Never Used  Substance and Sexual Activity  . Alcohol use: Yes    Alcohol/week:  0.0 standard drinks    Comment: 2-3 beers at night  . Drug use: No  . Sexual activity: Not on file  Lifestyle  . Physical activity:    Days per week: Not on file    Minutes per session: Not on file  . Stress: Not on file  Relationships  . Social connections:    Talks on phone: Not on file    Gets together: Not on file    Attends religious service: Not on file    Active member of club or organization: Not on file    Attends meetings of clubs or organizations: Not on file    Relationship status: Not on file  . Intimate partner violence:    Fear of current or ex partner: Not on file    Emotionally abused: Not on file    Physically abused: Not on file    Forced sexual activity: Not on file  Other Topics Concern  . Not on file  Social History Narrative   Married.  Lives in a one story home.   Works as a Dance movement psychotherapist.   1 daughter.     Enjoys visiting family at the beach.     Past Surgical History:  Procedure Laterality Date  . ANTERIOR CERVICAL DECOMP/DISCECTOMY FUSION N/A 01/31/2017   Procedure: ANTERIOR CERVICAL DECOMPRESSION/DISCECTOMY  FUSION CERVICAL FOUR - CERVICAL FIVE , CERVICAL SIX- CERVICAL SEVEN;  Surgeon: Ashok Pall, MD;  Location: [facility];  Service: Neurosurgery;  Laterality: N/A;  . CYSTOSCOPY WITH STENT PLACEMENT Left 06/07/2015   Procedure: CYSTOSCOPY WITH STENT PLACEMENT;  Surgeon: Ardis Hughs, MD;  Location: [facility];  Service: Urology;  Laterality: Left;  . LAPAROSCOPIC NEPHRECTOMY Left 06/30/2015   Procedure: LAPAROSCOPIC LEFT  NEPHRECTOMY;  Surgeon: Ardis Hughs, MD;  Location: [facility];  Service: Urology;  Laterality: Left;  . NECK SURGERY      Family History  Problem Relation Age of Onset  . Diabetes Mother   . Breast cancer Mother   . Liver disease Father   . Alcohol abuse Father        Deceased 27  . Healthy Brother   . Healthy Daughter     Allergies  Allergen Reactions  . No Known Allergies     Current Outpatient  Medications on File Prior to Visit  Medication Sig Dispense Refill  . acetaminophen (TYLENOL) 500 MG tablet Take 2 tablets (1,000 mg total) by mouth every 6 (six) hours as needed for mild pain or moderate pain. 30 tablet 0  . albuterol (PROVENTIL HFA;VENTOLIN HFA) 108 (90 Base) MCG/ACT inhaler Inhale 2 puffs into the lungs every 4 (four) hours as needed for wheezing or shortness of breath. 1 Inhaler 0  . fluticasone (FLOVENT HFA) 110 MCG/ACT inhaler Inhale 1 puff into the lungs 2 (two) times daily. 1 Inhaler 0   No current facility-administered medications on file prior to visit.     BP 120/78   Pulse 73   Temp 98.1 F (36.7 C) (Oral)   Ht 5\' 10"  (1.778 m)   Wt 207 lb 8 oz (94.1 kg)   SpO2 99%   BMI 29.77 kg/m    Objective:   Physical Exam  Constitutional: He appears well-nourished.  Neck: Neck supple.  Cardiovascular: Normal rate and regular rhythm.  Respiratory: Effort normal and breath sounds normal. He has no wheezes.  Skin: Skin is warm and dry.           Assessment & Plan:

## 2018-07-22 ENCOUNTER — Telehealth: Payer: Self-pay | Admitting: Oncology

## 2018-07-22 ENCOUNTER — Inpatient Hospital Stay: Payer: BLUE CROSS/BLUE SHIELD | Attending: Oncology | Admitting: Oncology

## 2018-07-22 VITALS — BP 153/75 | HR 70 | Temp 97.6°F | Resp 17 | Ht 70.0 in | Wt 208.1 lb

## 2018-07-22 DIAGNOSIS — Z85528 Personal history of other malignant neoplasm of kidney: Secondary | ICD-10-CM | POA: Diagnosis not present

## 2018-07-22 DIAGNOSIS — R918 Other nonspecific abnormal finding of lung field: Secondary | ICD-10-CM | POA: Diagnosis not present

## 2018-07-22 DIAGNOSIS — Z905 Acquired absence of kidney: Secondary | ICD-10-CM | POA: Insufficient documentation

## 2018-07-22 DIAGNOSIS — Z79899 Other long term (current) drug therapy: Secondary | ICD-10-CM | POA: Diagnosis not present

## 2018-07-22 DIAGNOSIS — D225 Melanocytic nevi of trunk: Secondary | ICD-10-CM | POA: Diagnosis not present

## 2018-07-22 DIAGNOSIS — G629 Polyneuropathy, unspecified: Secondary | ICD-10-CM | POA: Diagnosis not present

## 2018-07-22 DIAGNOSIS — I700 Atherosclerosis of aorta: Secondary | ICD-10-CM | POA: Insufficient documentation

## 2018-07-22 DIAGNOSIS — C649 Malignant neoplasm of unspecified kidney, except renal pelvis: Secondary | ICD-10-CM

## 2018-07-22 NOTE — Telephone Encounter (Signed)
Pt requested may appointment due to insurance. Gave pt avs and calendar

## 2018-07-22 NOTE — Progress Notes (Signed)
Hematology and Oncology Follow Up Visit  Joseph May [id number] 1960-10-07 57 y.o. 07/22/2018 3:42 PM [name], MDClark, Belenda Cruise, MD   Principle Diagnosis: 57 year old with renal cell carcinoma diagnosed in September 2016.  He was found to have T3N0 clear cell histology.  Prior Therapy: He is status post laparoscopic left radical nephrectomy done on 06/30/2015. The pathology revealed clear cell histology nuclear grade 4 measuring 3.6 cm.   Current therapy: Active surveillance.  Interim History: [name] returns today for repeat evaluation.  Since the last visit, he reports no major changes or complaints.  He continues to be active and attends to activities of daily living.  He denies any abdominal pain or distention.  He denies any flank pain or hematuria.  He does have neuropathy in his right leg which is chronic in nature and unchanged.  He is able to drive and perform physical activity without any decline.  He does not report any headaches, blurry vision, syncope or seizures.  He denies any alteration mental status or confusion.  He has not reported any fevers, chills or sweats. Does not report any cough, hemoptysis or wheezing. Desirable any chest pain, palpitation, orthopnea or leg edema. Does not report any nausea, vomiting, or abdominal distention.  He denies any changes in bowel habits.  He does not report any frequency, urgency or hesitancy.  Denies any lymphadenopathy or petechiae.  He denies any bleeding or clotting tendency.  Does not report any joint pain or deformity. Remaining review of systems is negative.    Medications: I have reviewed the patient's current medications.  Current Outpatient Medications  Medication Sig Dispense Refill  . acetaminophen (TYLENOL) 500 MG tablet Take 2 tablets (1,000 mg total) by mouth every 6 (six) hours as needed for mild pain or moderate pain. 30 tablet 0  . albuterol (PROVENTIL HFA;VENTOLIN HFA) 108 (90 Base) MCG/ACT inhaler Inhale 2  puffs into the lungs every 4 (four) hours as needed for wheezing or shortness of breath. 1 Inhaler 0  . fluticasone (FLOVENT HFA) 110 MCG/ACT inhaler Inhale 1 puff into the lungs 2 (two) times daily. 1 Inhaler 0   No current facility-administered medications for this visit.      Allergies:  Allergies  Allergen Reactions  . No Known Allergies     Past Medical History, Surgical history, Social history, and Family History were reviewed and updated.   Physical Exam: Blood pressure (!) 153/75, pulse 70, temperature 97.6 F (36.4 C), temperature source Oral, resp. rate 17, height 5\' 10"  (1.778 m), weight 208 lb 1.6 oz (94.4 kg), SpO2 100 %.   ECOG: 0    General appearance: Comfortable appearing without any discomfort Head: Normocephalic without any trauma Oropharynx: Mucous membranes are moist and pink without any thrush or ulcers. Eyes: Pupils are equal and round reactive to light. Lymph nodes: No cervical, supraclavicular, inguinal or axillary lymphadenopathy.   Heart:regular rate and rhythm.  S1 and S2 without leg edema. Lung: Clear without any rhonchi or wheezes.  No dullness to percussion. Abdomin: Soft, nontender, nondistended with good bowel sounds.  No hepatosplenomegaly. Musculoskeletal: No joint deformity or effusion.  Full range of motion noted. Neurological: No deficits noted on motor, sensory and deep tendon reflex exam. Skin: No petechial rash or dryness.  Appeared moist.    Lab Results: Lab Results  Component Value Date   WBC 5.4 07/10/2018   HGB 13.6 07/10/2018   HCT 41.1 07/10/2018   MCV 91.7 07/10/2018   PLT 184 07/10/2018  Chemistry      Component Value Date/Time   NA 139 07/10/2018 0736   NA 139 06/28/2017 0829   K 4.4 07/10/2018 0736   K 4.4 06/28/2017 0829   CL 102 07/10/2018 0736   CO2 29 07/10/2018 0736   CO2 27 06/28/2017 0829   BUN 15 07/10/2018 0736   BUN 15.2 06/28/2017 0829   CREATININE 1.30 (H) 07/10/2018 0736   CREATININE 1.3  06/28/2017 0829      Component Value Date/Time   CALCIUM 8.9 07/10/2018 0736   CALCIUM 9.5 06/28/2017 0829   ALKPHOS 78 07/10/2018 0736   ALKPHOS 82 06/28/2017 0829   AST 24 07/10/2018 0736   AST 15 06/28/2017 0829   ALT 23 07/10/2018 0736   ALT 19 06/28/2017 0829   BILITOT 0.7 07/10/2018 0736   BILITOT 0.57 06/28/2017 0829     EXAM: CT CHEST, ABDOMEN, AND PELVIS WITH CONTRAST  TECHNIQUE: Multidetector CT imaging of the chest, abdomen and pelvis was performed following the standard protocol during bolus administration of intravenous contrast.  CONTRAST:  151mL ISOVUE-300 IOPAMIDOL (ISOVUE-300) INJECTION 61%  COMPARISON:  07/01/2017  FINDINGS: CT CHEST FINDINGS  Cardiovascular: Normal heart size.  No pericardial effusion.  Mediastinum/Nodes: No enlarged mediastinal, hilar, or axillary lymph nodes. Thyroid gland, trachea, and esophagus demonstrate no significant findings.  Lungs/Pleura: No pleural effusion. Previously characterized benign nodules are unchanged from previous exam. No new pulmonary nodularity to suggest metastatic disease.  Musculoskeletal: Spondylosis is identified within the thoracic spine. No suspicious bone lesions identified.  CT ABDOMEN PELVIS FINDINGS  Hepatobiliary: No focal liver abnormality is seen. No gallstones, gallbladder wall thickening, or biliary dilatation.  Pancreas: Unremarkable. No pancreatic ductal dilatation or surrounding inflammatory changes.  Spleen: Normal in size without focal abnormality.  Adrenals/Urinary Tract: Normal appearance of the adrenal glands. Right kidney is unremarkable. No hydronephrosis or mass. Previous left nephrectomy. No suspicious nodularity within the nephrectomy bed to suggest recurrence of tumor. Urinary bladder appears normal.  Stomach/Bowel: Normal appearance of the stomach. The small bowel loops have a normal course and caliber. The appendix is visualized and appears normal.  No pathologic dilatation of the colon.  Vascular/Lymphatic: Normal appearance of the abdominal aorta. Duplicated IVC. No abdominal or pelvic adenopathy.  Reproductive: Prostate is unremarkable.  Other: No abdominal wall hernia or abnormality. No abdominopelvic ascites.  Musculoskeletal: Degenerative disc disease identified within the lumbar spine.  IMPRESSION: 1. Status post left nephrectomy without recurrent or metastatic disease. 2. Stable appearance of previously characterized benign pulmonary nodules. 3.  Aortic Atherosclerosis (ICD10-I70.0).   Impression and Plan:  57 year old man with::  1.  Renal cell carcinoma diagnosed in 2016.  He was found to have T3a N0 after a radical nephrectomy.  He remains disease-free without any disease relapse since that time.  Laboratory data as well as CT scan obtained on 07/14/2018 were personally reviewed and did not show any evidence of disease relapse.  Risks and benefits of continuing active surveillance at this time was discussed today.  And he is agreeable to continue with routine imaging studies.  He is will be repeated every 6 months to complete 4 years and annually after that.    2. Peripheral neuropathy: Manageable at this time and predominantly sensory on the right side.   3. Follow-up: Will be in 6 months for laboratory, CT scan and MD evaluation.  15  minutes was spent with the patient face-to-face today.  More than 50% of time was spent on reviewing the natural course of  his disease, risk of relapse and reviewing imaging studies.   Zola Button, MD 10/29/20193:42 PM

## 2018-08-16 ENCOUNTER — Other Ambulatory Visit: Payer: Self-pay | Admitting: Primary Care

## 2018-08-16 DIAGNOSIS — R059 Cough, unspecified: Secondary | ICD-10-CM

## 2018-08-16 DIAGNOSIS — R05 Cough: Secondary | ICD-10-CM

## 2018-08-16 DIAGNOSIS — R062 Wheezing: Secondary | ICD-10-CM

## 2018-08-18 NOTE — Telephone Encounter (Signed)
Last prescribed on and seen on 06/19/2018

## 2018-08-18 NOTE — Telephone Encounter (Signed)
Is he using his fluticasone inhaler everyday? How many times daily? How's he doing on this?

## 2018-08-19 NOTE — Telephone Encounter (Signed)
Pt returned call about Rx.

## 2018-08-19 NOTE — Telephone Encounter (Signed)
Message left for patient to return my call.  

## 2018-08-20 NOTE — Telephone Encounter (Signed)
Spoken to patient on 08/19/2018. He stated that he is using this as needed. In the past couple of weeks. He only had to use it for 3-4 times and that is 1 puff BID. He is doing much better. Patient stated that he wanted to refill before 08/24/2018 because of insurance. He can get it for $100 but after December more than $300 or so.

## 2018-08-20 NOTE — Telephone Encounter (Signed)
Noted. Refill sent, my chart message sent.

## 2018-12-11 ENCOUNTER — Encounter: Payer: Self-pay | Admitting: Oncology

## 2018-12-17 ENCOUNTER — Telehealth: Payer: Self-pay | Admitting: *Deleted

## 2018-12-17 NOTE — Telephone Encounter (Signed)
Patient requesting to have labs drawn at [facility] the day before his scan here on may 5th. I spoke with diane in our lab and she does not think that can be done, if the labs in epic are released they would go to one of our machines. I spoke with a gentleman in the lab at [facility] and he states that would not work. They are a clinical lab and patient would need a provider there to draw labs at [facility]. Called patient and left this message on his voicemail. Also gave him the number to call the lab if he has any questions.

## 2018-12-19 ENCOUNTER — Telehealth: Payer: Self-pay | Admitting: *Deleted

## 2018-12-19 NOTE — Telephone Encounter (Signed)
Returned patient's call. Lm for patient to call me

## 2018-12-19 NOTE — Telephone Encounter (Signed)
Patient calling to say his insurance company needs a new order entered for CT scan. Old order has expired.

## 2018-12-22 ENCOUNTER — Other Ambulatory Visit: Payer: Self-pay | Admitting: Oncology

## 2018-12-22 DIAGNOSIS — C649 Malignant neoplasm of unspecified kidney, except renal pelvis: Secondary | ICD-10-CM

## 2019-01-02 ENCOUNTER — Encounter: Payer: Self-pay | Admitting: *Deleted

## 2019-01-08 ENCOUNTER — Telehealth: Payer: Self-pay | Admitting: Oncology

## 2019-01-08 NOTE — Telephone Encounter (Signed)
Changed 5/7 appt to phone visit. Called and left msg for patient explaining changes.

## 2019-01-15 ENCOUNTER — Ambulatory Visit: Payer: BLUE CROSS/BLUE SHIELD

## 2019-01-23 ENCOUNTER — Other Ambulatory Visit: Payer: Self-pay

## 2019-01-23 ENCOUNTER — Other Ambulatory Visit
Admission: RE | Admit: 2019-01-23 | Discharge: 2019-01-23 | Disposition: A | Discharge status: Home / Self Care | Payer: BLUE CROSS/BLUE SHIELD | Source: Ambulatory Visit | Attending: Oncology | Admitting: Oncology

## 2019-01-23 DIAGNOSIS — C649 Malignant neoplasm of unspecified kidney, except renal pelvis: Secondary | ICD-10-CM | POA: Diagnosis not present

## 2019-01-23 LAB — CBC WITH DIFFERENTIAL/PLATELET
Abs Immature Granulocytes: 0.01 10*3/uL (ref 0.00–0.07)
Basophils Absolute: 0.1 10*3/uL (ref 0.0–0.1)
Basophils Relative: 1 %
Eosinophils Absolute: 0.3 10*3/uL (ref 0.0–0.5)
Eosinophils Relative: 6 %
HCT: 42.5 % (ref 39.0–52.0)
Hemoglobin: 14.3 g/dL (ref 13.0–17.0)
Immature Granulocytes: 0 %
Lymphocytes Relative: 21 %
Lymphs Abs: 1.2 10*3/uL (ref 0.7–4.0)
MCH: 30.2 pg (ref 26.0–34.0)
MCHC: 33.6 g/dL (ref 30.0–36.0)
MCV: 89.7 fL (ref 80.0–100.0)
Monocytes Absolute: 0.5 10*3/uL (ref 0.1–1.0)
Monocytes Relative: 8 %
Neutro Abs: 3.5 10*3/uL (ref 1.7–7.7)
Neutrophils Relative %: 64 %
Platelets: 211 10*3/uL (ref 150–400)
RBC: 4.74 MIL/uL (ref 4.22–5.81)
RDW: 12.4 % (ref 11.5–15.5)
WBC: 5.6 10*3/uL (ref 4.0–10.5)
nRBC: 0 % (ref 0.0–0.2)

## 2019-01-23 LAB — COMPREHENSIVE METABOLIC PANEL
ALT: 21 U/L (ref 0–44)
AST: 19 U/L (ref 15–41)
Albumin: 3.9 g/dL (ref 3.5–5.0)
Alkaline Phosphatase: 75 U/L (ref 38–126)
Anion gap: 8 (ref 5–15)
BUN: 18 mg/dL (ref 6–20)
CO2: 26 mmol/L (ref 22–32)
Calcium: 9 mg/dL (ref 8.9–10.3)
Chloride: 105 mmol/L (ref 98–111)
Creatinine, Ser: 1.43 mg/dL — ABNORMAL HIGH (ref 0.61–1.24)
GFR calc Af Amer: >60 mL/min (ref >60)
GFR calc non Af Amer: 55 mL/min — ABNORMAL LOW (ref >60)
Glucose, Bld: 110 mg/dL — ABNORMAL HIGH (ref 70–99)
Potassium: 4.1 mmol/L (ref 3.5–5.1)
Sodium: 139 mmol/L (ref 135–145)
Total Bilirubin: 0.6 mg/dL (ref 0.3–1.2)
Total Protein: 7.1 g/dL (ref 6.5–8.1)

## 2019-01-27 ENCOUNTER — Ambulatory Visit: Payer: BLUE CROSS/BLUE SHIELD

## 2019-01-27 ENCOUNTER — Other Ambulatory Visit: Payer: Self-pay

## 2019-01-27 ENCOUNTER — Ambulatory Visit
Admission: RE | Admit: 2019-01-27 | Discharge: 2019-01-27 | Disposition: A | Discharge status: Home / Self Care | Payer: BLUE CROSS/BLUE SHIELD | Source: Ambulatory Visit | Attending: Oncology | Admitting: Oncology

## 2019-01-27 DIAGNOSIS — C649 Malignant neoplasm of unspecified kidney, except renal pelvis: Secondary | ICD-10-CM | POA: Insufficient documentation

## 2019-01-27 DIAGNOSIS — Z905 Acquired absence of kidney: Secondary | ICD-10-CM | POA: Diagnosis not present

## 2019-01-27 DIAGNOSIS — C642 Malignant neoplasm of left kidney, except renal pelvis: Secondary | ICD-10-CM | POA: Diagnosis not present

## 2019-01-27 DIAGNOSIS — Z483 Aftercare following surgery for neoplasm: Secondary | ICD-10-CM | POA: Diagnosis not present

## 2019-01-27 MED ORDER — IOHEXOL 300 MG/ML  SOLN
85.0000 mL | Freq: Once | INTRAMUSCULAR | Status: AC | PRN
  Administered 2019-01-27: 08:00:00 85 mL via INTRAVENOUS

## 2019-01-29 ENCOUNTER — Inpatient Hospital Stay: Payer: BLUE CROSS/BLUE SHIELD | Attending: Oncology | Admitting: Oncology

## 2019-01-29 DIAGNOSIS — C649 Malignant neoplasm of unspecified kidney, except renal pelvis: Secondary | ICD-10-CM | POA: Diagnosis not present

## 2019-01-29 DIAGNOSIS — Z905 Acquired absence of kidney: Secondary | ICD-10-CM | POA: Diagnosis not present

## 2019-01-29 NOTE — Progress Notes (Signed)
Hematology and Oncology Follow Up for Telemedicine Visits  Joseph May [id number] 05/27/1961 58 y.o. 01/29/2019 1:02 PM Annabell Howells, MD   I connected with Joseph May on 01/29/19 at  3:30 PM EDT by telephone visit and verified that I am speaking with the correct person using two identifiers.   I discussed the limitations, risks, security and privacy concerns of performing an evaluation and management service by telemedicine and the availability of in-person appointments. I also discussed with the patient that there may be a patient responsible charge related to this service. The patient expressed understanding and agreed to proceed.  Other persons participating in the visit and their role in the encounter: None  Patient's location: Home Provider's location: Office   Principle Diagnosis: 58 year old with T3N0 clear cell renal cell carcinoma diagnosed in September 2016.    Prior Therapy: He is status post laparoscopic left radical nephrectomy done on 06/30/2015. The pathology revealed clear cell histology nuclear grade 4 measuring 3.6 cm.   Current therapy: Active surveillance.   Interim History: Joseph May reports no major complaints at this time.  He denies any flank pain or hematuria.  He denies any changes in his performance status or activity level.  Denies any abdominal discomfort or diarrhea.  He remains active and attends activities of daily living.     Medications: I have reviewed the patient's current medications.  Current Outpatient Medications  Medication Sig Dispense Refill  . acetaminophen (TYLENOL) 500 MG tablet Take 2 tablets (1,000 mg total) by mouth every 6 (six) hours as needed for mild pain or moderate pain. 30 tablet 0  . albuterol (PROVENTIL HFA;VENTOLIN HFA) 108 (90 Base) MCG/ACT inhaler Inhale 2 puffs into the lungs every 4 (four) hours as needed for wheezing or shortness of breath. 1 Inhaler 0  . FLOVENT HFA 110 MCG/ACT inhaler TAKE  1 PUFF BY MOUTH TWICE A DAY 12 Inhaler 0   No current facility-administered medications for this visit.      Allergies:  Allergies  Allergen Reactions  . No Known Allergies     Past Medical History, Surgical history, Social history, and Family History were reviewed and updated.    Lab Results: Lab Results  Component Value Date   WBC 5.6 01/23/2019   HGB 14.3 01/23/2019   HCT 42.5 01/23/2019   MCV 89.7 01/23/2019   PLT 211 01/23/2019     Chemistry      Component Value Date/Time   NA 139 01/23/2019 0801   NA 139 06/28/2017 0829   K 4.1 01/23/2019 0801   K 4.4 06/28/2017 0829   CL 105 01/23/2019 0801   CO2 26 01/23/2019 0801   CO2 27 06/28/2017 0829   BUN 18 01/23/2019 0801   BUN 15.2 06/28/2017 0829   CREATININE 1.43 (H) 01/23/2019 0801   CREATININE 1.3 06/28/2017 0829      Component Value Date/Time   CALCIUM 9.0 01/23/2019 0801   CALCIUM 9.5 06/28/2017 0829   ALKPHOS 75 01/23/2019 0801   ALKPHOS 82 06/28/2017 0829   AST 19 01/23/2019 0801   AST 15 06/28/2017 0829   ALT 21 01/23/2019 0801   ALT 19 06/28/2017 0829   BILITOT 0.6 01/23/2019 0801   BILITOT 0.57 06/28/2017 0829       Radiological Studies:  EXAM: CT CHEST WITH CONTRAST  CT ABDOMEN AND PELVIS WITH AND WITHOUT CONTRAST  TECHNIQUE: Multidetector CT imaging of the chest was performed during intravenous contrast administration. Multidetector CT imaging of the abdomen  and pelvis was performed following the standard protocol before and during bolus administration of intravenous contrast.  CONTRAST:  7mL OMNIPAQUE IOHEXOL 300 MG/ML  SOLN  COMPARISON:  07/14/2018  FINDINGS: CT CHEST FINDINGS  Cardiovascular: The heart size is normal. No substantial pericardial effusion.  Mediastinum/Nodes: No mediastinal lymphadenopathy. There is no hilar lymphadenopathy. The esophagus has normal imaging features. There is no axillary lymphadenopathy.  Lungs/Pleura: The central  tracheobronchial airways are patent. No suspicious pulmonary nodule or mass. 4 mm subpleural nodule posterior left lower lobe is unchanged. No pleural effusion.  Musculoskeletal: No worrisome lytic or sclerotic osseous abnormality.  CT ABDOMEN AND PELVIS FINDINGS  Hepatobiliary: No suspicious focal abnormality within the liver parenchyma. The tiny low-density lesion in segment IVA described previously is stable. There is no evidence for gallstones, gallbladder wall thickening, or pericholecystic fluid. No intrahepatic or extrahepatic biliary dilation.  Pancreas: No focal mass lesion. No dilatation of the main duct. No intraparenchymal cyst. No peripancreatic edema.  Spleen: No splenomegaly. No focal mass lesion.  Adrenals/Urinary Tract: No adrenal nodule or mass. Left kidney surgically absent. No new or suspicious soft tissue in the nephrectomy bed. Right kidney unremarkable. Right ureter unremarkable. The urinary bladder appears normal for the degree of distention.  Stomach/Bowel: Stomach is unremarkable. No gastric wall thickening. No evidence of outlet obstruction. Duodenum is normally positioned as is the ligament of Treitz. Tiny duodenal diverticulum evident. No small bowel wall thickening. No small bowel dilatation. The terminal ileum is normal. The appendix is normal. No gross colonic mass. No colonic wall thickening. Diverticular changes are noted in the left colon. A relatively prominent diverticulum in the proximal sigmoid colon is ill-defined and demonstrates subtle pericolonic edema (see image 111/series 9 and coronal image 63/series 4).  Vascular/Lymphatic: There is abdominal aortic atherosclerosis without aneurysm. IVC duplication again noted. There is no gastrohepatic or hepatoduodenal ligament lymphadenopathy. No intraperitoneal or retroperitoneal lymphadenopathy. No pelvic sidewall lymphadenopathy.  Reproductive: The prostate gland and seminal  vesicles are unremarkable.  Other: No intraperitoneal free fluid.  Musculoskeletal: No worrisome lytic or sclerotic osseous abnormality.  IMPRESSION: 1. Status post left nephrectomy. No new or progressive interval findings to suggest recurrent or metastatic disease in the chest, abdomen, or pelvis. 2. Left colonic diverticulosis with subtle edema/inflammation surrounding and ill-defined diverticulum in the proximal sigmoid segment. Features suspicious for subtle diverticulitis. No perforation or abscess. 3.  Aortic Atherosclerois (ICD10-170.0) 4. These results will be called to the ordering clinician or representative by the Radiologist Assistant, and communication documented in the PACS or zVision Dashboard.  Impression and Plan:  58 year old man with::  1.    T3N0 clear cell renal cell carcinoma diagnosed in 2016.    He remains on active surveillance at this time without any evidence of recurrent disease.  CT scan laboratory testing obtained on May 5 of 2020 were reviewed and showed no evidence of recurrent disease.  The natural course of this disease was discussed and I recommended continued active surveillance in 6 months and annually after that.    2. Follow-up: In 6 months for repeat evaluation including CT scan and laboratory testing.    I discussed the assessment and treatment plan with the patient. The patient was provided an opportunity to ask questions and all were answered. The patient agreed with the plan and demonstrated an understanding of the instructions.   The patient was advised to call back or seek an in-person evaluation if the symptoms worsen or if the condition fails to improve as anticipated.  I provided 20 minutes of non face-to-face telephone visit time during this encounter, and > 50% was dedicated to reviewing his laboratory data, imaging studies and answering questions regarding future plan of care.  Zola Button, MD 01/29/2019 1:02 PM

## 2019-06-19 ENCOUNTER — Other Ambulatory Visit: Payer: Self-pay | Admitting: Oncology

## 2019-06-19 DIAGNOSIS — C649 Malignant neoplasm of unspecified kidney, except renal pelvis: Secondary | ICD-10-CM

## 2019-06-22 ENCOUNTER — Telehealth: Payer: Self-pay

## 2019-06-22 NOTE — Telephone Encounter (Signed)
Called patient to inform him that CT abdomen pelvis and CT chest have been authorized. Patient verbalized understanding and patient will call to schedule scans.

## 2019-07-22 ENCOUNTER — Other Ambulatory Visit
Admission: RE | Admit: 2019-07-22 | Discharge: 2019-07-22 | Disposition: A | Discharge status: Home / Self Care | Payer: BC Managed Care – PPO | Source: Ambulatory Visit | Attending: Oncology | Admitting: Oncology

## 2019-07-22 ENCOUNTER — Other Ambulatory Visit: Payer: Self-pay

## 2019-07-22 DIAGNOSIS — C649 Malignant neoplasm of unspecified kidney, except renal pelvis: Secondary | ICD-10-CM | POA: Insufficient documentation

## 2019-07-22 LAB — CBC WITH DIFFERENTIAL/PLATELET
Abs Immature Granulocytes: 0.02 10*3/uL (ref 0.00–0.07)
Basophils Absolute: 0 10*3/uL (ref 0.0–0.1)
Basophils Relative: 1 %
Eosinophils Absolute: 0.2 10*3/uL (ref 0.0–0.5)
Eosinophils Relative: 4 %
HCT: 42.1 % (ref 39.0–52.0)
Hemoglobin: 13.8 g/dL (ref 13.0–17.0)
Immature Granulocytes: 0 %
Lymphocytes Relative: 26 %
Lymphs Abs: 1.2 10*3/uL (ref 0.7–4.0)
MCH: 29.6 pg (ref 26.0–34.0)
MCHC: 32.8 g/dL (ref 30.0–36.0)
MCV: 90.1 fL (ref 80.0–100.0)
Monocytes Absolute: 0.5 10*3/uL (ref 0.1–1.0)
Monocytes Relative: 11 %
Neutro Abs: 2.7 10*3/uL (ref 1.7–7.7)
Neutrophils Relative %: 58 %
Platelets: 189 10*3/uL (ref 150–400)
RBC: 4.67 MIL/uL (ref 4.22–5.81)
RDW: 12.7 % (ref 11.5–15.5)
WBC: 4.7 10*3/uL (ref 4.0–10.5)
nRBC: 0 % (ref 0.0–0.2)

## 2019-07-22 LAB — COMPREHENSIVE METABOLIC PANEL
ALT: 21 U/L (ref 0–44)
AST: 19 U/L (ref 15–41)
Albumin: 4.2 g/dL (ref 3.5–5.0)
Alkaline Phosphatase: 70 U/L (ref 38–126)
Anion gap: 12 (ref 5–15)
BUN: 18 mg/dL (ref 6–20)
CO2: 25 mmol/L (ref 22–32)
Calcium: 9.1 mg/dL (ref 8.9–10.3)
Chloride: 101 mmol/L (ref 98–111)
Creatinine, Ser: 1.17 mg/dL (ref 0.61–1.24)
GFR calc Af Amer: >60 mL/min (ref >60)
GFR calc non Af Amer: >60 mL/min (ref >60)
Glucose, Bld: 104 mg/dL — ABNORMAL HIGH (ref 70–99)
Potassium: 4.1 mmol/L (ref 3.5–5.1)
Sodium: 138 mmol/L (ref 135–145)
Total Bilirubin: 0.9 mg/dL (ref 0.3–1.2)
Total Protein: 7.3 g/dL (ref 6.5–8.1)

## 2019-07-24 ENCOUNTER — Ambulatory Visit
Admission: RE | Admit: 2019-07-24 | Discharge: 2019-07-24 | Disposition: A | Discharge status: Home / Self Care | Payer: BC Managed Care – PPO | Source: Ambulatory Visit | Attending: Oncology | Admitting: Oncology

## 2019-07-24 ENCOUNTER — Other Ambulatory Visit: Payer: Self-pay

## 2019-07-24 DIAGNOSIS — Z85528 Personal history of other malignant neoplasm of kidney: Secondary | ICD-10-CM | POA: Diagnosis not present

## 2019-07-24 DIAGNOSIS — J8410 Pulmonary fibrosis, unspecified: Secondary | ICD-10-CM | POA: Diagnosis not present

## 2019-07-24 DIAGNOSIS — C649 Malignant neoplasm of unspecified kidney, except renal pelvis: Secondary | ICD-10-CM | POA: Insufficient documentation

## 2019-07-24 DIAGNOSIS — K5730 Diverticulosis of large intestine without perforation or abscess without bleeding: Secondary | ICD-10-CM | POA: Diagnosis not present

## 2019-07-24 MED ORDER — IOHEXOL 300 MG/ML  SOLN
100.0000 mL | Freq: Once | INTRAMUSCULAR | Status: AC | PRN
  Administered 2019-07-24: 09:00:00 100 mL via INTRAVENOUS

## 2019-07-31 ENCOUNTER — Inpatient Hospital Stay: Payer: BC Managed Care – PPO | Attending: Oncology | Admitting: Oncology

## 2019-07-31 ENCOUNTER — Other Ambulatory Visit: Payer: Self-pay

## 2019-07-31 VITALS — BP 130/75 | HR 80 | Temp 98.5°F | Resp 18 | Ht 70.0 in | Wt 203.8 lb

## 2019-07-31 DIAGNOSIS — K5730 Diverticulosis of large intestine without perforation or abscess without bleeding: Secondary | ICD-10-CM | POA: Insufficient documentation

## 2019-07-31 DIAGNOSIS — Z7951 Long term (current) use of inhaled steroids: Secondary | ICD-10-CM | POA: Diagnosis not present

## 2019-07-31 DIAGNOSIS — I700 Atherosclerosis of aorta: Secondary | ICD-10-CM | POA: Insufficient documentation

## 2019-07-31 DIAGNOSIS — Z79899 Other long term (current) drug therapy: Secondary | ICD-10-CM | POA: Diagnosis not present

## 2019-07-31 DIAGNOSIS — Z85528 Personal history of other malignant neoplasm of kidney: Secondary | ICD-10-CM | POA: Insufficient documentation

## 2019-07-31 DIAGNOSIS — Z905 Acquired absence of kidney: Secondary | ICD-10-CM | POA: Diagnosis not present

## 2019-07-31 DIAGNOSIS — G629 Polyneuropathy, unspecified: Secondary | ICD-10-CM | POA: Insufficient documentation

## 2019-07-31 DIAGNOSIS — C649 Malignant neoplasm of unspecified kidney, except renal pelvis: Secondary | ICD-10-CM

## 2019-07-31 NOTE — Progress Notes (Signed)
Hematology and Oncology Follow Up Visit  Joseph May [id number] December 23, 1960 58 y.o. 07/31/2019 3:21 PM Joseph May Joseph May, Joseph Penna, NP   Principle Diagnosis: 58 year old with T3N0 clear-cell renal cell carcinoma diagnosed in September 2016.    Prior Therapy: He is status post laparoscopic left radical nephrectomy done on 06/30/2015. The pathology revealed clear cell histology nuclear grade 4 measuring 3.6 cm.   Current therapy: Active surveillance.  Interim History: Joseph May is here for a follow-up visit.  Since the last visit, he reports no major changes in his health.  He continues to feel well without any evidence of abdominal pain or discomfort.  He denies any flank pain or hospitalizations.  He denies any hematuria or dysuria.  He denies any weight loss or constitutional symptoms.   Patient denied any alteration mental status, neuropathy, confusion or dizziness.  Denies any headaches or lethargy.  Denies any night sweats, weight loss or changes in appetite.  Denied orthopnea, dyspnea on exertion or chest discomfort.  Denies shortness of breath, difficulty breathing hemoptysis or cough.  Denies any abdominal distention, nausea, early satiety or dyspepsia.  Denies any hematuria, frequency, dysuria or nocturia.  Denies any skin irritation, dryness or rash.  Denies any ecchymosis or petechiae.  Denies any lymphadenopathy or clotting.  Denies any heat or cold intolerance.  Denies any anxiety or depression.  Remaining review of system is negative.     Medications: Updated without any changes. Current Outpatient Medications  Medication Sig Dispense Refill  . acetaminophen (TYLENOL) 500 MG tablet Take 2 tablets (1,000 mg total) by mouth every 6 (six) hours as needed for mild pain or moderate pain. 30 tablet 0  . albuterol (PROVENTIL HFA;VENTOLIN HFA) 108 (90 Base) MCG/ACT inhaler Inhale 2 puffs into the lungs every 4 (four) hours as needed for wheezing or shortness of breath. 1  Inhaler 0  . FLOVENT HFA 110 MCG/ACT inhaler TAKE 1 PUFF BY MOUTH TWICE A DAY 12 Inhaler 0   No current facility-administered medications for this visit.      Allergies:  Allergies  Allergen Reactions  . No Known Allergies     Past Medical History, Surgical history, Social history, and Family History reviewed without changes.   Physical Exam:  Blood pressure 130/75, pulse 80, temperature 98.5 F (36.9 C), temperature source Temporal, resp. rate 18, height 5\' 10"  (1.778 m), weight 203 lb 12.8 oz (92.4 kg), SpO2 100 %.   ECOG: 0     General appearance: Alert, awake without any distress. Head: Atraumatic without abnormalities Oropharynx: Without any thrush or ulcers. Eyes: No scleral icterus. Lymph nodes: No lymphadenopathy noted in the cervical, supraclavicular, or axillary nodes Heart:regular rate and rhythm, without any murmurs or gallops.   Lung: Clear to auscultation without any rhonchi, wheezes or dullness to percussion. Abdomin: Soft, nontender without any shifting dullness or ascites. Musculoskeletal: No clubbing or cyanosis. Neurological: No motor or sensory deficits. Skin: No rashes or lesions. Psychiatric: Mood and affect appeared normal.   Lab Results: Lab Results  Component Value Date   WBC 4.7 07/22/2019   HGB 13.8 07/22/2019   HCT 42.1 07/22/2019   MCV 90.1 07/22/2019   PLT 189 07/22/2019     Chemistry      Component Value Date/Time   NA 138 07/22/2019 0742   NA 139 06/28/2017 0829   K 4.1 07/22/2019 0742   K 4.4 06/28/2017 0829   CL 101 07/22/2019 0742   CO2 25 07/22/2019 0742   CO2 27  06/28/2017 0829   BUN 18 07/22/2019 0742   BUN 15.2 06/28/2017 0829   CREATININE 1.17 07/22/2019 0742   CREATININE 1.3 06/28/2017 0829      Component Value Date/Time   CALCIUM 9.1 07/22/2019 0742   CALCIUM 9.5 06/28/2017 0829   ALKPHOS 70 07/22/2019 0742   ALKPHOS 82 06/28/2017 0829   AST 19 07/22/2019 0742   AST 15 06/28/2017 0829   ALT 21 07/22/2019  0742   ALT 19 06/28/2017 0829   BILITOT 0.9 07/22/2019 0742   BILITOT 0.57 06/28/2017 0829      EXAM: CT CHEST, ABDOMEN, AND PELVIS WITH CONTRAST  TECHNIQUE: Multidetector CT imaging of the chest, abdomen and pelvis was performed following the standard protocol during bolus administration of intravenous contrast.  CONTRAST:  197mL OMNIPAQUE IOHEXOL 300 MG/ML  SOLN  COMPARISON:  CT the chest, abdomen and pelvis 01/27/2019.  FINDINGS: CT CHEST FINDINGS  Cardiovascular: Heart size is normal. There is no significant pericardial fluid, thickening or pericardial calcification. Atherosclerosis in the thoracic aorta. No definite coronary artery calcifications.  Mediastinum/Nodes: No pathologically enlarged mediastinal or hilar lymph nodes. Esophagus is unremarkable in appearance. No axillary lymphadenopathy.  Lungs/Pleura: No suspicious appearing pulmonary nodules or masses are noted. Small calcified granuloma in the right lower lobe incidentally noted. No acute consolidative airspace disease. No pleural effusions.  Musculoskeletal: Orthopedic fixation hardware in the cervical spine. There are no aggressive appearing lytic or blastic lesions noted in the visualized portions of the skeleton.  CT ABDOMEN PELVIS FINDINGS  Hepatobiliary: Subcentimeter hypoattenuating lesion in segment 4A of the liver, too small to characterize, but similar to prior studies and statistically likely to represent a tiny cyst. No suspicious cystic or solid hepatic lesions otherwise noted. No intra or extrahepatic biliary ductal dilatation. Gallbladder is normal in appearance.  Pancreas: No pancreatic mass. No pancreatic ductal dilatation. No pancreatic or peripancreatic fluid collections or inflammatory changes.  Spleen: Unremarkable.  Adrenals/Urinary Tract: Status post left nephrectomy. No unexpected soft tissue mass in the nephrectomy bed to suggest locally recurrent disease.  Right kidney and bilateral adrenal glands are normal in appearance. No hydroureteronephrosis. Urinary bladder is normal in appearance.  Stomach/Bowel: Normal appearance of the stomach. No pathologic dilatation of small bowel or colon. Numerous colonic diverticulae are noted, particularly in the sigmoid colon, without surrounding inflammatory changes to suggest an acute diverticulitis at this time. Normal appendix.  Vascular/Lymphatic: Aortic atherosclerosis, without evidence of aneurysm or dissection in the abdominal or pelvic vasculature. Borderline enlarged right inguinal lymph node measuring 9 mm in short axis, nonspecific and stable compared to prior studies, presumably benign. No lymphadenopathy noted abdomen or pelvis.  Reproductive: Prostate gland and seminal vesicles are unremarkable in appearance.  Other: No significant volume of ascites.  No pneumoperitoneum.  Musculoskeletal: There are no aggressive appearing lytic or blastic lesions noted in the visualized portions of the skeleton.  IMPRESSION: 1. Status post left radical nephrectomy, without evidence to suggest local recurrence of disease or definite metastatic disease in the chest, abdomen or pelvis. 2. Mild colonic diverticulosis without evidence of acute diverticulitis at this time. 3. Aortic atherosclerosis.    58 year old man with::  1.  T3 N0 clear-cell renal cell carcinoma diagnosed in 2016.  He status post nephrectomy without any evidence of recurrent disease since that time.  The natural course of his disease was updated today and risk of relapse was assessed.  Laboratory testing and CT scan obtained on 07/24/2019 was personally reviewed and showed no evidence of metastatic disease.  At this time, I recommended continued active surveillance given his reasonable risk of relapse at this time.  Salvage systemic therapy discussed at this time in case he develops metastatic disease.  These would be oral  targeted therapy versus immunotherapy.  After discussion today, we have elected to continue with imaging studies every 6 months to complete 5 years and potentially increase the interval after that.    2. Peripheral neuropathy: Unchanged at this time from previous examination.   3. Follow-up: Will be in 6 months after repeating laboratory testing and CT scan.  15  minutes was spent with the patient face-to-face today.  More than 50% of time was dedicated to reviewing his disease status, reviewing imaging studies, treatment options and future plan of care.   Zola Button, MD 11/6/20203:21 PM

## 2019-08-03 ENCOUNTER — Telehealth: Payer: Self-pay | Admitting: Oncology

## 2019-08-03 NOTE — Telephone Encounter (Signed)
Scheduled appt per 11/6 los.  Sent a staff message to get a calendar mailed out.

## 2019-12-02 DIAGNOSIS — Z1283 Encounter for screening for malignant neoplasm of skin: Secondary | ICD-10-CM | POA: Diagnosis not present

## 2019-12-02 DIAGNOSIS — L720 Epidermal cyst: Secondary | ICD-10-CM | POA: Diagnosis not present

## 2019-12-02 DIAGNOSIS — L578 Other skin changes due to chronic exposure to nonionizing radiation: Secondary | ICD-10-CM | POA: Diagnosis not present

## 2019-12-02 DIAGNOSIS — D229 Melanocytic nevi, unspecified: Secondary | ICD-10-CM | POA: Diagnosis not present

## 2019-12-02 DIAGNOSIS — L570 Actinic keratosis: Secondary | ICD-10-CM | POA: Diagnosis not present

## 2019-12-02 DIAGNOSIS — D225 Melanocytic nevi of trunk: Secondary | ICD-10-CM | POA: Diagnosis not present

## 2020-01-20 ENCOUNTER — Telehealth: Payer: Self-pay

## 2020-01-20 NOTE — Telephone Encounter (Signed)
Received TC from patient stating that he does not need his LAB appointment on 01/22/20 here at the cancer center because he is already scheduled to get his labs drawn at [facility] and they will fax over his lab results after they are done. I cancelled his lab appointment on 01/22/20

## 2020-01-22 ENCOUNTER — Inpatient Hospital Stay: Payer: Self-pay

## 2020-01-22 ENCOUNTER — Other Ambulatory Visit
Admission: RE | Admit: 2020-01-22 | Discharge: 2020-01-22 | Disposition: A | Discharge status: Home / Self Care | Payer: BC Managed Care – PPO | Source: Ambulatory Visit | Attending: Oncology | Admitting: Oncology

## 2020-01-22 DIAGNOSIS — C649 Malignant neoplasm of unspecified kidney, except renal pelvis: Secondary | ICD-10-CM | POA: Diagnosis not present

## 2020-01-22 LAB — COMPREHENSIVE METABOLIC PANEL
ALT: 17 U/L (ref 0–44)
AST: 16 U/L (ref 15–41)
Albumin: 4.2 g/dL (ref 3.5–5.0)
Alkaline Phosphatase: 66 U/L (ref 38–126)
Anion gap: 8 (ref 5–15)
BUN: 21 mg/dL — ABNORMAL HIGH (ref 6–20)
CO2: 28 mmol/L (ref 22–32)
Calcium: 8.9 mg/dL (ref 8.9–10.3)
Chloride: 103 mmol/L (ref 98–111)
Creatinine, Ser: 1.36 mg/dL — ABNORMAL HIGH (ref 0.61–1.24)
GFR calc Af Amer: >60 mL/min (ref >60)
GFR calc non Af Amer: 58 mL/min — ABNORMAL LOW (ref >60)
Glucose, Bld: 107 mg/dL — ABNORMAL HIGH (ref 70–99)
Potassium: 4.2 mmol/L (ref 3.5–5.1)
Sodium: 139 mmol/L (ref 135–145)
Total Bilirubin: 0.8 mg/dL (ref 0.3–1.2)
Total Protein: 7.2 g/dL (ref 6.5–8.1)

## 2020-01-22 LAB — CBC WITH DIFFERENTIAL/PLATELET
Abs Immature Granulocytes: 0 10*3/uL (ref 0.00–0.07)
Basophils Absolute: 0 10*3/uL (ref 0.0–0.1)
Basophils Relative: 1 %
Eosinophils Absolute: 0.2 10*3/uL (ref 0.0–0.5)
Eosinophils Relative: 4 %
HCT: 42.7 % (ref 39.0–52.0)
Hemoglobin: 14.4 g/dL (ref 13.0–17.0)
Immature Granulocytes: 0 %
Lymphocytes Relative: 22 %
Lymphs Abs: 1.1 10*3/uL (ref 0.7–4.0)
MCH: 30.4 pg (ref 26.0–34.0)
MCHC: 33.7 g/dL (ref 30.0–36.0)
MCV: 90.1 fL (ref 80.0–100.0)
Monocytes Absolute: 0.5 10*3/uL (ref 0.1–1.0)
Monocytes Relative: 10 %
Neutro Abs: 3.2 10*3/uL (ref 1.7–7.7)
Neutrophils Relative %: 63 %
Platelets: 191 10*3/uL (ref 150–400)
RBC: 4.74 MIL/uL (ref 4.22–5.81)
RDW: 12.5 % (ref 11.5–15.5)
WBC: 4.9 10*3/uL (ref 4.0–10.5)
nRBC: 0 % (ref 0.0–0.2)

## 2020-01-25 ENCOUNTER — Ambulatory Visit
Admission: RE | Admit: 2020-01-25 | Discharge: 2020-01-25 | Disposition: A | Discharge status: Home / Self Care | Payer: BC Managed Care – PPO | Source: Ambulatory Visit | Attending: Oncology | Admitting: Oncology

## 2020-01-25 ENCOUNTER — Other Ambulatory Visit: Payer: Self-pay

## 2020-01-25 DIAGNOSIS — K5730 Diverticulosis of large intestine without perforation or abscess without bleeding: Secondary | ICD-10-CM | POA: Diagnosis not present

## 2020-01-25 DIAGNOSIS — I700 Atherosclerosis of aorta: Secondary | ICD-10-CM | POA: Diagnosis not present

## 2020-01-25 DIAGNOSIS — C649 Malignant neoplasm of unspecified kidney, except renal pelvis: Secondary | ICD-10-CM | POA: Insufficient documentation

## 2020-01-25 MED ORDER — IOHEXOL 300 MG/ML  SOLN
100.0000 mL | Freq: Once | INTRAMUSCULAR | Status: AC | PRN
  Administered 2020-01-25: 08:00:00 100 mL via INTRAVENOUS

## 2020-01-29 ENCOUNTER — Inpatient Hospital Stay: Payer: BC Managed Care – PPO | Attending: Oncology | Admitting: Oncology

## 2020-01-29 ENCOUNTER — Other Ambulatory Visit: Payer: Self-pay

## 2020-01-29 VITALS — BP 138/83 | HR 66 | Temp 98.0°F | Resp 18 | Ht 70.0 in | Wt 198.7 lb

## 2020-01-29 DIAGNOSIS — C649 Malignant neoplasm of unspecified kidney, except renal pelvis: Secondary | ICD-10-CM | POA: Diagnosis not present

## 2020-01-29 DIAGNOSIS — I700 Atherosclerosis of aorta: Secondary | ICD-10-CM | POA: Diagnosis not present

## 2020-01-29 DIAGNOSIS — Z905 Acquired absence of kidney: Secondary | ICD-10-CM | POA: Diagnosis not present

## 2020-01-29 DIAGNOSIS — G629 Polyneuropathy, unspecified: Secondary | ICD-10-CM | POA: Diagnosis not present

## 2020-01-29 DIAGNOSIS — K5730 Diverticulosis of large intestine without perforation or abscess without bleeding: Secondary | ICD-10-CM | POA: Diagnosis not present

## 2020-01-29 DIAGNOSIS — Z85528 Personal history of other malignant neoplasm of kidney: Secondary | ICD-10-CM | POA: Diagnosis not present

## 2020-01-29 DIAGNOSIS — Z7951 Long term (current) use of inhaled steroids: Secondary | ICD-10-CM | POA: Diagnosis not present

## 2020-01-29 DIAGNOSIS — Z79899 Other long term (current) drug therapy: Secondary | ICD-10-CM | POA: Diagnosis not present

## 2020-01-29 NOTE — Progress Notes (Signed)
Hematology and Oncology Follow Up Visit  Joseph May [id number] Oct 05, 1960 59 y.o. 01/29/2020 3:04 PM Joseph May Joseph May, NPClark, Joseph Penna, NP   Principle Diagnosis: 59 year old with kidney cancer diagnosed in 2016.  He was found to have T3N0 clear-cell histology without any disease recurrence since that time.     Prior Therapy: He is status post laparoscopic left radical nephrectomy done on 06/30/2015. The pathology revealed clear cell histology nuclear grade 4 measuring 3.6 cm.   Current therapy: Active surveillance.  Interim History: [name] is here for repeat evaluation.  Since the last visit, he reports no major changes in his health.  He denies any nausea vomiting or abdominal pain.  He denied flank pain or hematuria.  He denies any hematochezia or melena.  He remains active and continues to attempt activities of daily living.  He denied any excessive fatigue or tiredness.     Medications: Reviewed without changes. Current Outpatient Medications  Medication Sig Dispense Refill  . acetaminophen (TYLENOL) 500 MG tablet Take 2 tablets (1,000 mg total) by mouth every 6 (six) hours as needed for mild pain or moderate pain. 30 tablet 0  . albuterol (PROVENTIL HFA;VENTOLIN HFA) 108 (90 Base) MCG/ACT inhaler Inhale 2 puffs into the lungs every 4 (four) hours as needed for wheezing or shortness of breath. 1 Inhaler 0  . FLOVENT HFA 110 MCG/ACT inhaler TAKE 1 PUFF BY MOUTH TWICE A DAY 12 Inhaler 0   No current facility-administered medications for this visit.     Allergies:  Allergies  Allergen Reactions  . No Known Allergies        Physical Exam:  Blood pressure 138/83, pulse 66, temperature 98 F (36.7 C), temperature source Temporal, resp. rate 18, height 5\' 10"  (1.778 m), weight 198 lb 11.2 oz (90.1 kg), SpO2 98 %.    ECOG: 0      General appearance: Comfortable appearing without any discomfort Head: Normocephalic without any trauma Oropharynx: Mucous  membranes are moist and pink without any thrush or ulcers. Eyes: Pupils are equal and round reactive to light. Lymph nodes: No cervical, supraclavicular, inguinal or axillary lymphadenopathy.   Heart:regular rate and rhythm.  S1 and S2 without leg edema. Lung: Clear without any rhonchi or wheezes.  No dullness to percussion. Abdomin: Soft, nontender, nondistended with good bowel sounds.  No hepatosplenomegaly. Musculoskeletal: No joint deformity or effusion.  Full range of motion noted. Neurological: No deficits noted on motor, sensory and deep tendon reflex exam. Skin: No petechial rash or dryness.  Appeared moist.     Lab Results: Lab Results  Component Value Date   WBC 4.9 01/22/2020   HGB 14.4 01/22/2020   HCT 42.7 01/22/2020   MCV 90.1 01/22/2020   PLT 191 01/22/2020     Chemistry      Component Value Date/Time   NA 139 01/22/2020 0749   NA 139 06/28/2017 0829   K 4.2 01/22/2020 0749   K 4.4 06/28/2017 0829   CL 103 01/22/2020 0749   CO2 28 01/22/2020 0749   CO2 27 06/28/2017 0829   BUN 21 (H) 01/22/2020 0749   BUN 15.2 06/28/2017 0829   CREATININE 1.36 (H) 01/22/2020 0749   CREATININE 1.3 06/28/2017 0829      Component Value Date/Time   CALCIUM 8.9 01/22/2020 0749   CALCIUM 9.5 06/28/2017 0829   ALKPHOS 66 01/22/2020 0749   ALKPHOS 82 06/28/2017 0829   AST 16 01/22/2020 0749   AST 15 06/28/2017 0829   ALT  17 01/22/2020 0749   ALT 19 06/28/2017 0829   BILITOT 0.8 01/22/2020 0749   BILITOT 0.57 06/28/2017 0829        IMPRESSION: 1. Status post left radical nephrectomy. No findings to suggest locally recurrent disease or metastatic disease in the chest, abdomen or 2. Colonic diverticulosis without evidence of acute diverticulitis at this time. 3. Aortic atherosclerosis.   59 year old man with::  1.  Kidney cancer diagnosed in 2016.  He was found to have T3 N0 clear-cell histology without any evidence of metastatic disease.  He is currently on  active surveillance after radical nephrectomy without any evidence of disease relapse.  Laboratory data and imaging studies obtained on Jan 25, 2020 personally reviewed and discussed with the patient.  He has no evidence to suggest metastatic disease at this time.  The natural course of this disease was reviewed and treatment options upon developing systemic relapse was reviewed.  These would include immunotherapy, oral targeted therapy or combination of both.  At this time I have recommended continued active surveillance to complete 5 years of therapy which will be with the next visit.  Increase the interval between scanning will be suggested at that time.  He is agreeable with this plan.    2. Peripheral neuropathy: Remains overall stable without any changes.   3. Follow-up: In 6 months for a follow-up visit and repeat imaging studies at that time.  30  minutes were dedicated to this visit. The time was spent on reviewing laboratory data, imaging studies, discussing treatment options, and answering questions regarding future plan.    Zola Button, MD 5/7/20213:04 PM

## 2020-06-23 ENCOUNTER — Telehealth: Payer: Self-pay

## 2020-06-23 NOTE — Telephone Encounter (Signed)
Called patient and made him aware PA will be obtained closer to expected exam date. Patient verbalized understanding.

## 2020-06-23 NOTE — Telephone Encounter (Signed)
-----   Message from Aundria Rud sent at 06/23/2020 11:41 AM EDT ----- Will obtain PA closer to the expected date so that she PA doesn't not expire before test can be performed. Thanks  ----- Message ----- From: Tami Lin, RN Sent: 06/23/2020  11:20 AM EDT To: Aundria Rud  Patient called and is wanting to schedule his CT scans at [facility]. He is waiting on authorization to make sure the scans will be covered. Expected exam date is 07/22/20. Thanks, Lanelle Bal

## 2020-06-24 ENCOUNTER — Telehealth: Payer: Self-pay

## 2020-06-24 NOTE — Telephone Encounter (Signed)
Received a message from [facility] that scans have been authorized. Patient is aware and scans have been scheduled for 07/21/20.

## 2020-07-18 ENCOUNTER — Telehealth: Payer: Self-pay

## 2020-07-18 ENCOUNTER — Other Ambulatory Visit: Payer: Self-pay

## 2020-07-18 ENCOUNTER — Other Ambulatory Visit: Payer: Self-pay | Admitting: *Deleted

## 2020-07-18 DIAGNOSIS — C649 Malignant neoplasm of unspecified kidney, except renal pelvis: Secondary | ICD-10-CM

## 2020-07-18 NOTE — Telephone Encounter (Signed)
-----   Message from Wyatt Portela, MD sent at 07/18/2020 10:54 AM EDT ----- Labs can be done at [facility]. Thanks ----- Message ----- From: Kennedy Bucker, LPN Sent: 84/21/0312  10:02 AM EDT To: Wyatt Portela, MD  Sorry ----- Message ----- From: Wyatt Portela, MD Sent: 07/18/2020   9:29 AM EDT To: Kennedy Bucker, LPN  Patient? ----- Message ----- From: Kennedy Bucker, LPN Sent: 81/18/8677   9:17 AM EDT To: Wyatt Portela, MD  Patient called asking if lab orders could be put in for his labs can be drawn at [facility] instead of coming to the cancer center. Please advise. Thank you.  Kim LPN

## 2020-07-18 NOTE — Telephone Encounter (Signed)
Called patient to notify him that his labs can be drawn at [facility] per Dr Alen Blew.

## 2020-07-19 ENCOUNTER — Other Ambulatory Visit
Admission: RE | Admit: 2020-07-19 | Discharge: 2020-07-19 | Disposition: A | Discharge status: Home / Self Care | Payer: BC Managed Care – PPO | Attending: Oncology | Admitting: Oncology

## 2020-07-19 DIAGNOSIS — C649 Malignant neoplasm of unspecified kidney, except renal pelvis: Secondary | ICD-10-CM | POA: Insufficient documentation

## 2020-07-19 LAB — CBC WITH DIFFERENTIAL/PLATELET
Abs Immature Granulocytes: 0.01 10*3/uL (ref 0.00–0.07)
Basophils Absolute: 0 10*3/uL (ref 0.0–0.1)
Basophils Relative: 1 %
Eosinophils Absolute: 0.1 10*3/uL (ref 0.0–0.5)
Eosinophils Relative: 3 %
HCT: 39.8 % (ref 39.0–52.0)
Hemoglobin: 13.4 g/dL (ref 13.0–17.0)
Immature Granulocytes: 0 %
Lymphocytes Relative: 24 %
Lymphs Abs: 1 10*3/uL (ref 0.7–4.0)
MCH: 30.3 pg (ref 26.0–34.0)
MCHC: 33.7 g/dL (ref 30.0–36.0)
MCV: 90 fL (ref 80.0–100.0)
Monocytes Absolute: 0.5 10*3/uL (ref 0.1–1.0)
Monocytes Relative: 12 %
Neutro Abs: 2.6 10*3/uL (ref 1.7–7.7)
Neutrophils Relative %: 60 %
Platelets: 174 10*3/uL (ref 150–400)
RBC: 4.42 MIL/uL (ref 4.22–5.81)
RDW: 12.9 % (ref 11.5–15.5)
WBC: 4.2 10*3/uL (ref 4.0–10.5)
nRBC: 0 % (ref 0.0–0.2)

## 2020-07-19 LAB — COMPREHENSIVE METABOLIC PANEL
ALT: 19 U/L (ref 0–44)
AST: 20 U/L (ref 15–41)
Albumin: 4.2 g/dL (ref 3.5–5.0)
Alkaline Phosphatase: 63 U/L (ref 38–126)
Anion gap: 10 (ref 5–15)
BUN: 14 mg/dL (ref 6–20)
CO2: 27 mmol/L (ref 22–32)
Calcium: 9.2 mg/dL (ref 8.9–10.3)
Chloride: 102 mmol/L (ref 98–111)
Creatinine, Ser: 1.38 mg/dL — ABNORMAL HIGH (ref 0.61–1.24)
GFR, Estimated: >60 mL/min (ref >60)
Glucose, Bld: 109 mg/dL — ABNORMAL HIGH (ref 70–99)
Potassium: 3.8 mmol/L (ref 3.5–5.1)
Sodium: 139 mmol/L (ref 135–145)
Total Bilirubin: 0.9 mg/dL (ref 0.3–1.2)
Total Protein: 7 g/dL (ref 6.5–8.1)

## 2020-07-21 ENCOUNTER — Ambulatory Visit
Admission: RE | Admit: 2020-07-21 | Discharge: 2020-07-21 | Disposition: A | Discharge status: Home / Self Care | Payer: BC Managed Care – PPO | Source: Ambulatory Visit | Attending: Oncology | Admitting: Oncology

## 2020-07-21 ENCOUNTER — Other Ambulatory Visit: Payer: Self-pay

## 2020-07-21 DIAGNOSIS — K5730 Diverticulosis of large intestine without perforation or abscess without bleeding: Secondary | ICD-10-CM | POA: Diagnosis not present

## 2020-07-21 DIAGNOSIS — Z905 Acquired absence of kidney: Secondary | ICD-10-CM | POA: Diagnosis not present

## 2020-07-21 DIAGNOSIS — C649 Malignant neoplasm of unspecified kidney, except renal pelvis: Secondary | ICD-10-CM | POA: Insufficient documentation

## 2020-07-21 DIAGNOSIS — I700 Atherosclerosis of aorta: Secondary | ICD-10-CM | POA: Diagnosis not present

## 2020-07-21 DIAGNOSIS — R911 Solitary pulmonary nodule: Secondary | ICD-10-CM | POA: Diagnosis not present

## 2020-07-21 MED ORDER — IOHEXOL 300 MG/ML  SOLN
100.0000 mL | Freq: Once | INTRAMUSCULAR | Status: AC | PRN
  Administered 2020-07-21: 100 mL via INTRAVENOUS

## 2020-07-28 ENCOUNTER — Inpatient Hospital Stay: Payer: BC Managed Care – PPO | Attending: Oncology | Admitting: Oncology

## 2020-07-28 ENCOUNTER — Other Ambulatory Visit: Payer: Self-pay

## 2020-07-28 VITALS — BP 155/81 | HR 70 | Temp 97.1°F | Resp 18 | Ht 70.0 in | Wt 197.4 lb

## 2020-07-28 DIAGNOSIS — C649 Malignant neoplasm of unspecified kidney, except renal pelvis: Secondary | ICD-10-CM

## 2020-07-28 DIAGNOSIS — Z905 Acquired absence of kidney: Secondary | ICD-10-CM | POA: Diagnosis not present

## 2020-07-28 DIAGNOSIS — Z7951 Long term (current) use of inhaled steroids: Secondary | ICD-10-CM | POA: Insufficient documentation

## 2020-07-28 DIAGNOSIS — I700 Atherosclerosis of aorta: Secondary | ICD-10-CM | POA: Diagnosis not present

## 2020-07-28 DIAGNOSIS — Z85528 Personal history of other malignant neoplasm of kidney: Secondary | ICD-10-CM | POA: Diagnosis not present

## 2020-07-28 DIAGNOSIS — G629 Polyneuropathy, unspecified: Secondary | ICD-10-CM | POA: Insufficient documentation

## 2020-07-28 DIAGNOSIS — Z79899 Other long term (current) drug therapy: Secondary | ICD-10-CM | POA: Diagnosis not present

## 2020-07-28 NOTE — Progress Notes (Signed)
Hematology and Oncology Follow Up Visit  Joseph May [id number] [birth year] 59 y.o. 07/28/2020 9:44 AM Joseph May, Leticia Penna, NPClark, Leticia Penna, NP   Principle Diagnosis: 59 year old with T3N0 clear-cell renal cell carcinoma diagnosed in 2016.     Prior Therapy: He is status post laparoscopic left radical nephrectomy done on 06/30/2015. The pathology revealed clear cell histology nuclear grade 4 measuring 3.6 cm.   Current therapy: Active surveillance.  Interim History: Joseph May returns today for repeat follow-up. Since the last visit, he reports no major changes in his health.  He denies any nausea, vomiting or abdominal pain.  He denies any flank pain or hematochezia or melena.  He denies any recent hospitalization or illnesses.     Medications: Updated on review Current Outpatient Medications  Medication Sig Dispense Refill  . acetaminophen (TYLENOL) 500 MG tablet Take 2 tablets (1,000 mg total) by mouth every 6 (six) hours as needed for mild pain or moderate pain. 30 tablet 0  . albuterol (PROVENTIL HFA;VENTOLIN HFA) 108 (90 Base) MCG/ACT inhaler Inhale 2 puffs into the lungs every 4 (four) hours as needed for wheezing or shortness of breath. 1 Inhaler 0  . FLOVENT HFA 110 MCG/ACT inhaler TAKE 1 PUFF BY MOUTH TWICE A DAY 12 Inhaler 0   No current facility-administered medications for this visit.     Allergies:  Allergies  Allergen Reactions  . No Known Allergies        Physical Exam:   Blood pressure (!) 155/81, pulse 70, temperature (!) 97.1 F (36.2 C), temperature source Tympanic, resp. rate 18, height 5\' 10"  (1.778 m), weight 197 lb 6.4 oz (89.5 kg), SpO2 100 %.    ECOG: 0     General appearance: Alert, awake without any distress. Head: Atraumatic without abnormalities Oropharynx: Without any thrush or ulcers. Eyes: No scleral icterus. Lymph nodes: No lymphadenopathy noted in the cervical, supraclavicular, or axillary nodes Heart:regular rate and rhythm,  without any murmurs or gallops.   Lung: Clear to auscultation without any rhonchi, wheezes or dullness to percussion. Abdomin: Soft, nontender without any shifting dullness or ascites. Musculoskeletal: No clubbing or cyanosis. Neurological: No motor or sensory deficits. Skin: No rashes or lesions. Psychiatric: Mood and affect appeared normal.     Lab Results: Lab Results  Component Value Date   WBC 4.2 07/19/2020   HGB 13.4 07/19/2020   HCT 39.8 07/19/2020   MCV 90.0 07/19/2020   PLT 174 07/19/2020     Chemistry      Component Value Date/Time   NA 139 07/19/2020 0900   NA 139 06/28/2017 0829   K 3.8 07/19/2020 0900   K 4.4 06/28/2017 0829   CL 102 07/19/2020 0900   CO2 27 07/19/2020 0900   CO2 27 06/28/2017 0829   BUN 14 07/19/2020 0900   BUN 15.2 06/28/2017 0829   CREATININE 1.38 (H) 07/19/2020 0900   CREATININE 1.3 06/28/2017 0829      Component Value Date/Time   CALCIUM 9.2 07/19/2020 0900   CALCIUM 9.5 06/28/2017 0829   ALKPHOS 63 07/19/2020 0900   ALKPHOS 82 06/28/2017 0829   AST 20 07/19/2020 0900   AST 15 06/28/2017 0829   ALT 19 07/19/2020 0900   ALT 19 06/28/2017 0829   BILITOT 0.9 07/19/2020 0900   BILITOT 0.57 06/28/2017 0829      IMPRESSION: 1. Status post left nephrectomy without evidence of recurrent malignancy or suspicious contrast enhancement. 2. No evidence of lymphadenopathy or metastatic disease in the chest, abdomen,  or pelvis. 3. Aortic Atherosclerosis (ICD10-I70.0).     59 year old man with::  1. T3N0 clear-cell renal cell carcinoma diagnosed in 2016. Kidney cancer diagnosed in 2016.    The natural course of his disease was reviewed and treatment options were discussed. He continues to be on active surveillance without any evidence of relapse disease. CT scan July 21, 2020 was personally reviewed and showed no evidence of metastatic disease. Risks and benefits of continuing this treatment long-term were discussed. And  interval of surveillance was reviewed with repeat imaging studies between 6 to 12 months still indicated.  After discussion today he is agreeable we will tentatively schedule imaging studies in May 2022.    2. Peripheral neuropathy: No exacerbation noted since last visit.  Predominantly sensory and not interfering with function.   3. Follow-up: In 6 months for repeat follow-up. 30  minutes were spent on this encounter. The time was dedicated to reviewing his disease status, discussing imaging studies as well as outlining future plan of care.    Zola Button, MD 11/4/20219:44 AM

## 2020-12-07 ENCOUNTER — Encounter: Payer: BC Managed Care – PPO | Admitting: Dermatology

## 2021-01-13 ENCOUNTER — Telehealth: Payer: BC Managed Care – PPO | Admitting: Physician Assistant

## 2021-01-13 DIAGNOSIS — J208 Acute bronchitis due to other specified organisms: Secondary | ICD-10-CM

## 2021-01-13 DIAGNOSIS — B9689 Other specified bacterial agents as the cause of diseases classified elsewhere: Secondary | ICD-10-CM | POA: Diagnosis not present

## 2021-01-13 MED ORDER — PREDNISONE 10 MG (21) PO TBPK: ORAL_TABLET | ORAL | 0 refills | Status: DC

## 2021-01-13 MED ORDER — BENZONATATE 100 MG PO CAPS: 100.0000 mg | ORAL_CAPSULE | Freq: Three times a day (TID) | ORAL | 0 refills | Status: DC | PRN

## 2021-01-13 MED ORDER — AZITHROMYCIN 250 MG PO TABS: ORAL_TABLET | ORAL | 0 refills | Status: DC

## 2021-01-13 NOTE — Progress Notes (Signed)
We are sorry that you are not feeling well.  Here is how we plan to help!  Based on your presentation I believe you most likely have A cough due to bacteria.  When patients have a fever and a productive cough with a change in color or increased sputum production, we are concerned about bacterial bronchitis.  If left untreated it can progress to pneumonia.  If your symptoms do not improve with your treatment plan it is important that you contact your provider.   I have prescribed Azithromyin 250 mg: two tablets now and then one tablet daily for 4 additonal days    In addition you may use A prescription cough medication called Tessalon Perles 100mg . You may take 1-2 capsules every 8 hours as needed for your cough.  Prednisone 10 mg daily for 6 days (see taper instructions below)  Directions for 6 day taper: Day 1: 2 tablets before breakfast, 1 after both lunch & dinner and 2 at bedtime Day 2: 1 tab before breakfast, 1 after both lunch & dinner and 2 at bedtime Day 3: 1 tab at each meal & 1 at bedtime Day 4: 1 tab at breakfast, 1 at lunch, 1 at bedtime Day 5: 1 tab at breakfast & 1 tab at bedtime Day 6: 1 tab at breakfast   From your responses in the eVisit questionnaire you describe inflammation in the upper respiratory tract which is causing a significant cough.  This is commonly called Bronchitis and has four common causes:    Allergies  Viral Infections  Acid Reflux  Bacterial Infection Allergies, viruses and acid reflux are treated by controlling symptoms or eliminating the cause. An example might be a cough caused by taking certain blood pressure medications. You stop the cough by changing the medication. Another example might be a cough caused by acid reflux. Controlling the reflux helps control the cough.  USE OF BRONCHODILATOR ("RESCUE") INHALERS: There is a risk from using your bronchodilator too frequently.  The risk is that over-reliance on a medication which only relaxes the  muscles surrounding the breathing tubes can reduce the effectiveness of medications prescribed to reduce swelling and congestion of the tubes themselves.  Although you feel brief relief from the bronchodilator inhaler, your asthma may actually be worsening with the tubes becoming more swollen and filled with mucus.  This can delay other crucial treatments, such as oral steroid medications. If you need to use a bronchodilator inhaler daily, several times per day, you should discuss this with your provider.  There are probably better treatments that could be used to keep your asthma under control.     HOME CARE . Only take medications as instructed by your medical team. . Complete the entire course of an antibiotic. . Drink plenty of fluids and get plenty of rest. . Avoid close contacts especially the very young and the elderly . Cover your mouth if you cough or cough into your sleeve. . Always remember to wash your hands . A steam or ultrasonic humidifier can help congestion.   GET HELP RIGHT AWAY IF: . You develop worsening fever. . You become short of breath . You cough up blood. . Your symptoms persist after you have completed your treatment plan MAKE SURE YOU   Understand these instructions.  Will watch your condition.  Will get help right away if you are not doing well or get worse.  Your e-visit answers were reviewed by a board certified advanced clinical practitioner to complete your  personal care plan.  Depending on the condition, your plan could have included both over the counter or prescription medications. If there is a problem please reply  once you have received a response from your provider. Your safety is important to [place].  If you have drug allergies check your prescription carefully.    You can use MyChart to ask questions about today's visit, request a non-urgent call back, or ask for a work or school excuse for 24 hours related to this e-Visit. If it has been greater than  24 hours you will need to follow up with your provider, or enter a new e-Visit to address those concerns. You will get an e-mail in the next two days asking about your experience.  I hope that your e-visit has been valuable and will speed your recovery. Thank you for using e-visits.  I provided 5 minutes of non face-to-face time during this encounter for chart review and documentation.

## 2021-02-03 ENCOUNTER — Other Ambulatory Visit: Payer: Self-pay

## 2021-02-03 ENCOUNTER — Other Ambulatory Visit
Admission: RE | Admit: 2021-02-03 | Discharge: 2021-02-03 | Disposition: A | Discharge status: Home / Self Care | Payer: BC Managed Care – PPO | Attending: Oncology | Admitting: Oncology

## 2021-02-03 ENCOUNTER — Inpatient Hospital Stay: Payer: BC Managed Care – PPO | Attending: Oncology

## 2021-02-03 DIAGNOSIS — Z79899 Other long term (current) drug therapy: Secondary | ICD-10-CM | POA: Insufficient documentation

## 2021-02-03 DIAGNOSIS — Z905 Acquired absence of kidney: Secondary | ICD-10-CM | POA: Insufficient documentation

## 2021-02-03 DIAGNOSIS — C649 Malignant neoplasm of unspecified kidney, except renal pelvis: Secondary | ICD-10-CM

## 2021-02-03 DIAGNOSIS — Z85528 Personal history of other malignant neoplasm of kidney: Secondary | ICD-10-CM | POA: Insufficient documentation

## 2021-02-03 DIAGNOSIS — G629 Polyneuropathy, unspecified: Secondary | ICD-10-CM | POA: Insufficient documentation

## 2021-02-03 LAB — COMPREHENSIVE METABOLIC PANEL
ALT: 18 U/L (ref 0–44)
AST: 20 U/L (ref 15–41)
Albumin: 4.2 g/dL (ref 3.5–5.0)
Alkaline Phosphatase: 61 U/L (ref 38–126)
Anion gap: 7 (ref 5–15)
BUN: 17 mg/dL (ref 6–20)
CO2: 28 mmol/L (ref 22–32)
Calcium: 9 mg/dL (ref 8.9–10.3)
Chloride: 103 mmol/L (ref 98–111)
Creatinine, Ser: 1.25 mg/dL — ABNORMAL HIGH (ref 0.61–1.24)
GFR, Estimated: >60 mL/min (ref >60)
Glucose, Bld: 111 mg/dL — ABNORMAL HIGH (ref 70–99)
Potassium: 4.4 mmol/L (ref 3.5–5.1)
Sodium: 138 mmol/L (ref 135–145)
Total Bilirubin: 1 mg/dL (ref 0.3–1.2)
Total Protein: 7.3 g/dL (ref 6.5–8.1)

## 2021-02-03 LAB — CBC WITH DIFFERENTIAL (CANCER CENTER ONLY)
Abs Immature Granulocytes: 0.01 10*3/uL (ref 0.00–0.07)
Basophils Absolute: 0 10*3/uL (ref 0.0–0.1)
Basophils Relative: 1 %
Eosinophils Absolute: 0.1 10*3/uL (ref 0.0–0.5)
Eosinophils Relative: 2 %
HCT: 40 % (ref 39.0–52.0)
Hemoglobin: 13.7 g/dL (ref 13.0–17.0)
Immature Granulocytes: 0 %
Lymphocytes Relative: 20 %
Lymphs Abs: 0.8 10*3/uL (ref 0.7–4.0)
MCH: 30.6 pg (ref 26.0–34.0)
MCHC: 34.3 g/dL (ref 30.0–36.0)
MCV: 89.3 fL (ref 80.0–100.0)
Monocytes Absolute: 0.5 10*3/uL (ref 0.1–1.0)
Monocytes Relative: 11 %
Neutro Abs: 2.6 10*3/uL (ref 1.7–7.7)
Neutrophils Relative %: 66 %
Platelet Count: 149 10*3/uL — ABNORMAL LOW (ref 150–400)
RBC: 4.48 MIL/uL (ref 4.22–5.81)
RDW: 13 % (ref 11.5–15.5)
WBC Count: 4 10*3/uL (ref 4.0–10.5)
nRBC: 0 % (ref 0.0–0.2)

## 2021-02-06 ENCOUNTER — Ambulatory Visit
Admission: RE | Admit: 2021-02-06 | Discharge: 2021-02-06 | Disposition: A | Discharge status: Home / Self Care | Payer: BC Managed Care – PPO | Source: Ambulatory Visit | Attending: Oncology | Admitting: Oncology

## 2021-02-06 ENCOUNTER — Other Ambulatory Visit: Payer: Self-pay

## 2021-02-06 DIAGNOSIS — C649 Malignant neoplasm of unspecified kidney, except renal pelvis: Secondary | ICD-10-CM

## 2021-02-06 DIAGNOSIS — K5750 Diverticulosis of both small and large intestine without perforation or abscess without bleeding: Secondary | ICD-10-CM | POA: Diagnosis not present

## 2021-02-06 DIAGNOSIS — Z905 Acquired absence of kidney: Secondary | ICD-10-CM | POA: Diagnosis not present

## 2021-02-06 MED ORDER — IOHEXOL 300 MG/ML  SOLN
100.0000 mL | Freq: Once | INTRAMUSCULAR | Status: AC | PRN
  Administered 2021-02-06: 100 mL via INTRAVENOUS

## 2021-02-10 ENCOUNTER — Inpatient Hospital Stay: Payer: BC Managed Care – PPO | Admitting: Oncology

## 2021-02-10 ENCOUNTER — Other Ambulatory Visit: Payer: Self-pay

## 2021-02-10 VITALS — BP 140/62 | HR 76 | Temp 97.8°F | Resp 19 | Wt 193.9 lb

## 2021-02-10 DIAGNOSIS — C649 Malignant neoplasm of unspecified kidney, except renal pelvis: Secondary | ICD-10-CM | POA: Diagnosis not present

## 2021-02-10 DIAGNOSIS — G629 Polyneuropathy, unspecified: Secondary | ICD-10-CM | POA: Diagnosis not present

## 2021-02-10 DIAGNOSIS — Z79899 Other long term (current) drug therapy: Secondary | ICD-10-CM | POA: Diagnosis not present

## 2021-02-10 DIAGNOSIS — Z85528 Personal history of other malignant neoplasm of kidney: Secondary | ICD-10-CM | POA: Diagnosis not present

## 2021-02-10 DIAGNOSIS — Z905 Acquired absence of kidney: Secondary | ICD-10-CM | POA: Diagnosis not present

## 2021-02-10 NOTE — Progress Notes (Signed)
Hematology and Oncology Follow Up Visit  Joseph May [id number] [birth year] 60 y.o. 02/10/2021 9:44 AM [name], Leticia Penna, NPClark, Leticia Penna, NP   Principle Diagnosis: 60 year old man with kidney cancer diagnosed in 2016.  He was found to have T3N0 clear-cell histology.   Prior Therapy: He is status post laparoscopic left radical nephrectomy done on 06/30/2015. The pathology revealed clear cell histology nuclear grade 4 measuring 3.6 cm.   Current therapy: Active surveillance.  Interim History: Joseph May returns today for repeat evaluation.  Since last visit, he reports no major changes in his health.  Continues to be active and attends to activities of daily living.  He denies any abdominal pain, flank pain or hematuria.  He denies any recent hospitalization or illnesses.     Medications: Reviewed without changes. Current Outpatient Medications  Medication Sig Dispense Refill  . acetaminophen (TYLENOL) 500 MG tablet Take 2 tablets (1,000 mg total) by mouth every 6 (six) hours as needed for mild pain or moderate pain. 30 tablet 0  . albuterol (PROVENTIL HFA;VENTOLIN HFA) 108 (90 Base) MCG/ACT inhaler Inhale 2 puffs into the lungs every 4 (four) hours as needed for wheezing or shortness of breath. 1 Inhaler 0  . azithromycin (ZITHROMAX) 250 MG tablet Take 2 tablets PO on day one, and one tablet PO daily thereafter until completed. 6 tablet 0  . benzonatate (TESSALON) 100 MG capsule Take 1 capsule (100 mg total) by mouth 3 (three) times daily as needed for cough. 30 capsule 0  . FLOVENT HFA 110 MCG/ACT inhaler TAKE 1 PUFF BY MOUTH TWICE A DAY 12 Inhaler 0  . predniSONE (STERAPRED UNI-PAK 21 TAB) 10 MG (21) TBPK tablet 6 day taper; take as directed on package instructions 21 tablet 0   No current facility-administered medications for this visit.     Allergies:  Allergies  Allergen Reactions  . No Known Allergies        Physical Exam:   Blood pressure 140/62, pulse 76,  temperature 97.8 F (36.6 C), temperature source Tympanic, resp. rate 19, weight 193 lb 14.4 oz (88 kg), SpO2 100 %.     ECOG: 0    General appearance: Comfortable appearing without any discomfort Head: Normocephalic without any trauma Oropharynx: Mucous membranes are moist and pink without any thrush or ulcers. Eyes: Pupils are equal and round reactive to light. Lymph nodes: No cervical, supraclavicular, inguinal or axillary lymphadenopathy.   Heart:regular rate and rhythm.  S1 and S2 without leg edema. Lung: Clear without any rhonchi or wheezes.  No dullness to percussion. Abdomin: Soft, nontender, nondistended with good bowel sounds.  No hepatosplenomegaly. Musculoskeletal: No joint deformity or effusion.  Full range of motion noted. Neurological: No deficits noted on motor, sensory and deep tendon reflex exam. Skin: No petechial rash or dryness.  Appeared moist.      Lab Results: Lab Results  Component Value Date   WBC 4.0 02/03/2021   HGB 13.7 02/03/2021   HCT 40.0 02/03/2021   MCV 89.3 02/03/2021   PLT 149 (L) 02/03/2021     Chemistry      Component Value Date/Time   NA 138 02/03/2021 0817   NA 139 06/28/2017 0829   K 4.4 02/03/2021 0817   K 4.4 06/28/2017 0829   CL 103 02/03/2021 0817   CO2 28 02/03/2021 0817   CO2 27 06/28/2017 0829   BUN 17 02/03/2021 0817   BUN 15.2 06/28/2017 0829   CREATININE 1.25 (H) 02/03/2021 0817   CREATININE 1.3  06/28/2017 0829      Component Value Date/Time   CALCIUM 9.0 02/03/2021 0817   CALCIUM 9.5 06/28/2017 0829   ALKPHOS 61 02/03/2021 0817   ALKPHOS 82 06/28/2017 0829   AST 20 02/03/2021 0817   AST 15 06/28/2017 0829   ALT 18 02/03/2021 0817   ALT 19 06/28/2017 0829   BILITOT 1.0 02/03/2021 0817   BILITOT 0.57 06/28/2017 0829      IMPRESSION: Previous left nephrectomy. No evidence of recurrent or metastatic carcinoma within the chest, abdomen, or pelvis.  Colonic diverticulosis. No radiographic evidence of  diverticulitis.     60 year old man with:   1.  Kidney cancer diagnosed in 2016.  He was found to have T3N0 clear-cell at that time and surgical resection.  CT scan obtained on Feb 06, 2021 was personally reviewed and discussed with the patient and showed no evidence of metastatic disease.  Laboratory testing was also within normal range.  The natural course of this disease and risk of relapse was discussed at this time.  Salvage therapy for relapsed disease were discussed.  These will be in the form of oral targeted therapy, immunotherapy or combination of the above.  At this time no additional therapy is needed.  The plan is to repeat imaging studies for staging purposes in 8 months.    2. Peripheral neuropathy: Unchanged on evaluation at this time.   3. Follow-up: In January 2023 for repeat imaging studies.  30  minutes were dedicated to this visit.  The time was spent on reviewing laboratory data, imaging studies, disease status update and outlining future plan of care.   Zola Button, MD 5/20/20229:44 AM

## 2021-03-22 ENCOUNTER — Other Ambulatory Visit: Payer: Self-pay

## 2021-03-22 ENCOUNTER — Ambulatory Visit: Payer: BC Managed Care – PPO | Admitting: Dermatology

## 2021-03-22 DIAGNOSIS — L578 Other skin changes due to chronic exposure to nonionizing radiation: Secondary | ICD-10-CM

## 2021-03-22 DIAGNOSIS — D229 Melanocytic nevi, unspecified: Secondary | ICD-10-CM

## 2021-03-22 DIAGNOSIS — L720 Epidermal cyst: Secondary | ICD-10-CM

## 2021-03-22 DIAGNOSIS — Z1283 Encounter for screening for malignant neoplasm of skin: Secondary | ICD-10-CM | POA: Diagnosis not present

## 2021-03-22 DIAGNOSIS — B353 Tinea pedis: Secondary | ICD-10-CM

## 2021-03-22 DIAGNOSIS — D2271 Melanocytic nevi of right lower limb, including hip: Secondary | ICD-10-CM

## 2021-03-22 DIAGNOSIS — L570 Actinic keratosis: Secondary | ICD-10-CM | POA: Diagnosis not present

## 2021-03-22 DIAGNOSIS — Z86018 Personal history of other benign neoplasm: Secondary | ICD-10-CM

## 2021-03-22 DIAGNOSIS — L821 Other seborrheic keratosis: Secondary | ICD-10-CM

## 2021-03-22 DIAGNOSIS — L814 Other melanin hyperpigmentation: Secondary | ICD-10-CM

## 2021-03-22 DIAGNOSIS — D1800 Hemangioma unspecified site: Secondary | ICD-10-CM

## 2021-03-22 MED ORDER — KETOCONAZOLE 2 % EX CREA: 1.0000 "application " | TOPICAL_CREAM | Freq: Every day | CUTANEOUS | 11 refills | Status: AC

## 2021-03-22 NOTE — Patient Instructions (Addendum)
Cryotherapy Aftercare  . Wash gently with soap and water everyday.   . Apply Vaseline and Band-Aid daily until healed.   Instructions for [facility] Medications  One or more of your medications was sent to the [facility] mail order compounding pharmacy. You will receive an email from them and can purchase the medicine through that link. It will then be mailed to your home at the address you confirmed. If for any reason you do not receive an email from them, please check your spam folder. If you still do not find the email, please let us know. [facility] phone number is [phone].   

## 2021-03-22 NOTE — Progress Notes (Signed)
Follow-Up Visit   Subjective  Joseph May is a 60 y.o. male who presents for the following: Annual Exam (Total body exam today. No hx of skin cancer. Hx of dysplastic. Pt has a spot on his back that has drained that he would like to discuss removing. ). Patient here for full body skin exam and skin cancer screening.  The following portions of the chart were reviewed this encounter and updated as appropriate:  Tobacco  Allergies  Meds  Problems  Med Hx  Surg Hx  Fam Hx      Review of Systems: No other skin or systemic complaints except as noted in HPI or Assessment and Plan.  Objective  Well appearing patient in no apparent distress; mood and affect are within normal limits.  A full examination was performed including scalp, head, eyes, ears, nose, lips, neck, chest, axillae, abdomen, back, buttocks, bilateral upper extremities, bilateral lower extremities, hands, feet, fingers, toes, fingernails, and toenails. All findings within normal limits unless otherwise noted below.  Scalp x 5 (5) Erythematous thin papules/macules with gritty scale.   left upper back paraspinal 2.5 x 1.5 cm subcutaneous nodule.   Right Lower Leg - Anterior     Right Foot - Anterior Scaling and maceration web spaces and over distal and lateral soles.   Assessment & Plan  AK (actinic keratosis) (5) Scalp x 5 Hypertrophic AKs  Discussed options being Ln2 vs 5Fu/Calcipotriene cream BID for 1 week.   Plan to freeze thicker areas today and treat the remaining with 5Fu/Calcipotriene cream field treatment.  Apply to scalp BID x 1 week. Sent to [facility].  Actinic Damage - Severe, confluent actinic changes with pre-cancerous actinic keratoses  - Severe, chronic, not at goal, secondary to cumulative UV radiation exposure over time - diffuse scaly erythematous macules and papules with underlying dyspigmentation - Discussed Prescription "Field Treatment" for Severe, Chronic Confluent Actinic  Changes with Pre-Cancerous Actinic Keratoses Field treatment involves treatment of an entire area of skin that has confluent Actinic Changes (Sun/ Ultraviolet light damage) and PreCancerous Actinic Keratoses by method of PhotoDynamic Therapy (PDT) and/or prescription Topical Chemotherapy agents such as 5-fluorouracil, 5-fluorouracil/calcipotriene, and/or imiquimod.  The purpose is to decrease the number of clinically evident and subclinical PreCancerous lesions to prevent progression to development of skin cancer by chemically destroying early precancer changes that may or may not be visible.  It has been shown to reduce the risk of developing skin cancer in the treated area. As a result of treatment, redness, scaling, crusting, and open sores may occur during treatment course. One or more than one of these methods may be used and may have to be used several times to control, suppress and eliminate the PreCancerous changes. Discussed treatment course, expected reaction, and possible side effects. - Recommend daily broad spectrum sunscreen SPF 30+ to sun-exposed areas, reapply every 2 hours as needed.  - Staying in the shade or wearing long sleeves, sun glasses (UVA+UVB protection) and wide brim hats (4-inch brim around the entire circumference of the hat) are also recommended. - Call for new or changing lesions.  Destruction of lesion - Scalp x 5 Complexity: simple   Destruction method: cryotherapy   Informed consent: discussed and consent obtained   Timeout:  patient name, date of birth, surgical site, and procedure verified Lesion destroyed using liquid nitrogen: Yes   Region frozen until ice ball extended beyond lesion: Yes   Outcome: patient tolerated procedure well with no complications   Post-procedure  details: wound care instructions given    Epidermal cyst left upper back paraspinal  Recommend in office excision. Pt will schedule.   Nevus spilus Right Lower Leg -  Anterior Benign-appearing.  Observation.  Call clinic for new or changing moles.  Recommend daily use of broad spectrum spf 30+ sunscreen to sun-exposed areas.   Tinea pedis of both feet Right Foot - Anterior Chronic and persistent Start ketoconazole cream. Apply once a day at bedtime.   ketoconazole (NIZORAL) 2 % cream - Right Foot - Anterior Apply 1 application topically at bedtime.  Lentigines - Scattered tan macules - Due to sun exposure - Benign-appering, observe - Recommend daily broad spectrum sunscreen SPF 30+ to sun-exposed areas, reapply every 2 hours as needed. - Call for any changes  Seborrheic Keratoses - Stuck-on, waxy, tan-brown papules and/or plaques  - Benign-appearing - Discussed benign etiology and prognosis. - Observe - Call for any changes  Melanocytic Nevi - Tan-brown and/or pink-flesh-colored symmetric macules and papules - Benign appearing on exam today - Observation - Call clinic for new or changing moles - Recommend daily use of broad spectrum spf 30+ sunscreen to sun-exposed areas.   Hemangiomas - Red papules - Discussed benign nature - Observe - Call for any changes  Skin cancer screening performed today.  History of Dysplastic Nevi - No evidence of recurrence today - Recommend regular full body skin exams - Recommend daily broad spectrum sunscreen SPF 30+ to sun-exposed areas, reapply every 2 hours as needed.  - Call if any new or changing lesions are noted between office visits  Return in about 3 months (around 06/22/2021) for cyst excision and recheck AKs.  IHarriett Sine, CMA, am acting as scribe for Sarina Ser, MD. Documentation: I have reviewed the above documentation for accuracy and completeness, and I agree with the above.  Sarina Ser, MD

## 2021-04-01 ENCOUNTER — Encounter: Payer: Self-pay | Admitting: Dermatology

## 2021-04-03 ENCOUNTER — Ambulatory Visit: Payer: BC Managed Care – PPO | Admitting: Dermatology

## 2021-06-20 ENCOUNTER — Other Ambulatory Visit: Payer: Self-pay

## 2021-06-20 ENCOUNTER — Ambulatory Visit: Payer: BC Managed Care – PPO | Admitting: Dermatology

## 2021-06-20 DIAGNOSIS — L720 Epidermal cyst: Secondary | ICD-10-CM

## 2021-06-20 DIAGNOSIS — D485 Neoplasm of uncertain behavior of skin: Secondary | ICD-10-CM

## 2021-06-20 MED ORDER — MUPIROCIN 2 % EX OINT: 1.0000 "application " | TOPICAL_OINTMENT | Freq: Every day | CUTANEOUS | 0 refills | Status: DC

## 2021-06-20 NOTE — Progress Notes (Signed)
   Follow-Up Visit   Subjective  Joseph May is a 60 y.o. male who presents for the following: Cyst (Left upper back paraspinal - Excise today).  The following portions of the chart were reviewed this encounter and updated as appropriate:   Tobacco  Allergies  Meds  Problems  Med Hx  Surg Hx  Fam Hx     Review of Systems:  No other skin or systemic complaints except as noted in HPI or Assessment and Plan.  Objective  Well appearing patient in no apparent distress; mood and affect are within normal limits.  A focused examination was performed including back. Relevant physical exam findings are noted in the Assessment and Plan.  Left Upper Back Paraspinal Cystic papule 2.5 cm   Assessment & Plan  Neoplasm of uncertain behavior of skin Left Upper Back Paraspinal  Skin excision  Lesion length (cm):  2.5 Lesion width (cm):  2.5 Margin per side (cm):  0 Total excision diameter (cm):  2.5 Informed consent: discussed and consent obtained   Timeout: patient name, date of birth, surgical site, and procedure verified   Procedure prep:  Patient was prepped and draped in usual sterile fashion Prep type:  Isopropyl alcohol and povidone-iodine Anesthesia: the lesion was anesthetized in a standard fashion   Anesthetic:  1% lidocaine w/ epinephrine 1-100,000 buffered w/ 8.4% NaHCO3 Instrument used: #15 blade   Hemostasis achieved with: pressure   Hemostasis achieved with comment:  Electrocautery Outcome: patient tolerated procedure well with no complications   Post-procedure details: sterile dressing applied and wound care instructions given   Dressing type: bandage and pressure dressing (mupirocin)    Skin repair Complexity:  Complex Final length (cm):  3.5 Informed consent: discussed and consent obtained   Timeout: patient name, date of birth, surgical site, and procedure verified   Procedure prep:  Patient was prepped and draped in usual sterile fashion Prep type:   Povidone-iodine (Alcohol) Anesthesia: the lesion was anesthetized in a standard fashion   Anesthetic:  1% lidocaine w/ epinephrine 1-100,000 buffered w/ 8.4% NaHCO3 Reason for type of repair: reduce tension to allow closure, reduce the risk of dehiscence, infection, and necrosis, reduce subcutaneous dead space and avoid a hematoma, allow closure of the large defect, preserve normal anatomy, preserve normal anatomical and functional relationships and enhance both functionality and cosmetic results   Undermining: area extensively undermined   Undermining comment:  Undermining defect 2.5 cm Subcutaneous layers (deep stitches):  Suture type: Vicryl (polyglactin 910)   Subcutaneous suture technique: Inverted dermal. Fine/surface layer approximation (top stitches):  Suture type comment:  Nylon Stitches: simple running   Stitches comment:  Nylon Suture removal (days):  7 Hemostasis achieved with: suture and pressure Hemostasis achieved with comment:  Electrocautery Outcome: patient tolerated procedure well with no complications   Post-procedure details: sterile dressing applied and wound care instructions given   Dressing type: bandage and pressure dressing (mupirocin)   Additional details:  Undermining Defect measured 2.5 cm  mupirocin ointment (BACTROBAN) 2 % Apply 1 application topically daily. With dressing changes  Specimen 1 - Surgical pathology Differential Diagnosis: Cyst vs other  Check Margins: No  Return in about 1 week (around 06/27/2021) for suture removal and AK follow up.  I, Ashok Cordia, CMA, am acting as scribe for Sarina Ser, MD . Documentation: I have reviewed the above documentation for accuracy and completeness, and I agree with the above.  Sarina Ser, MD

## 2021-06-20 NOTE — Patient Instructions (Signed)
Wound Care Instructions  Cleanse wound gently with soap and water once a day then pat dry with clean gauze. Apply a thing coat of Petrolatum (petroleum jelly, "Vaseline") over the wound (unless you have an allergy to this). We recommend that you use a new, sterile tube of Vaseline. Do not pick or remove scabs. Do not remove the yellow or white "healing tissue" from the base of the wound.  Cover the wound with fresh, clean, nonstick gauze and secure with paper tape. You may use Band-Aids in place of gauze and tape if the would is small enough, but would recommend trimming much of the tape off as there is often too much. Sometimes Band-Aids can irritate the skin.  You should call the office for your biopsy report after 1 week if you have not already been contacted.  If you experience any problems, such as abnormal amounts of bleeding, swelling, significant bruising, significant pain, or evidence of infection, please call the office immediately.  FOR ADULT SURGERY PATIENTS: If you need something for pain relief you may take 1 extra strength Tylenol (acetaminophen) AND 2 Ibuprofen (200mg each) together every 4 hours as needed for pain. (do not take these if you are allergic to them or if you have a reason you should not take them.) Typically, you may only need pain medication for 1 to 3 days.   If you have any questions or concerns for your doctor, please call our main line at [phone] and press option 4 to reach your doctor's medical assistant. If no one answers, please leave a voicemail as directed and we will return your call as soon as possible. Messages left after 4 pm will be answered the following business day.   You may also send us a message via MyChart. We typically respond to MyChart messages within 1-2 business days.  For prescription refills, please ask your pharmacy to contact our office. Our fax number is [phone].  If you have an urgent issue when the clinic is closed that  cannot wait until the next business day, you can page your doctor at the number below.    Please note that while we do our best to be available for urgent issues outside of office hours, we are not available 24/7.   If you have an urgent issue and are unable to reach us, you may choose to seek medical care at your doctor's office, retail clinic, urgent care center, or emergency room.  If you have a medical emergency, please immediately call 911 or go to the emergency department.  Pager Numbers  - Dr. Kowalski: [phone]  - Dr. Moye: [phone]  - Dr. Stewart: [phone]  In the event of inclement weather, please call our main line at [phone] for an update on the status of any delays or closures.  Dermatology Medication Tips: Please keep the boxes that topical medications come in in order to help keep track of the instructions about where and how to use these. Pharmacies typically print the medication instructions only on the boxes and not directly on the medication tubes.   If your medication is too expensive, please contact our office at [phone] option 4 or send us a message through MyChart.   We are unable to tell what your co-pay for medications will be in advance as this is different depending on your insurance coverage. However, we may be able to find a substitute medication at lower cost or fill out paperwork to get insurance to cover a needed   medication.   If a prior authorization is required to get your medication covered by your insurance company, please allow us 1-2 business days to complete this process.  Drug prices often vary depending on where the prescription is filled and some pharmacies may offer cheaper prices.  The website www.goodrx.com contains coupons for medications through different pharmacies. The prices here do not account for what the cost may be with help from insurance (it may be cheaper with your insurance), but the website can give you the  price if you did not use any insurance.  - You can print the associated coupon and take it with your prescription to the pharmacy.  - You may also stop by our office during regular business hours and pick up a GoodRx coupon card.  - If you need your prescription sent electronically to a different pharmacy, notify our office through [facility] MyChart or by phone at [phone] option 4.   

## 2021-06-21 ENCOUNTER — Telehealth: Payer: Self-pay

## 2021-06-21 NOTE — Telephone Encounter (Signed)
Left message for patient regarding surgery/hd  

## 2021-06-22 ENCOUNTER — Encounter: Payer: Self-pay | Admitting: Dermatology

## 2021-06-27 ENCOUNTER — Other Ambulatory Visit: Payer: Self-pay

## 2021-06-27 ENCOUNTER — Encounter: Payer: Self-pay | Admitting: Dermatology

## 2021-06-27 ENCOUNTER — Ambulatory Visit (INDEPENDENT_AMBULATORY_CARE_PROVIDER_SITE_OTHER): Payer: BC Managed Care – PPO | Admitting: Dermatology

## 2021-06-27 DIAGNOSIS — L570 Actinic keratosis: Secondary | ICD-10-CM

## 2021-06-27 DIAGNOSIS — Z4802 Encounter for removal of sutures: Secondary | ICD-10-CM

## 2021-06-27 DIAGNOSIS — Z86018 Personal history of other benign neoplasm: Secondary | ICD-10-CM | POA: Diagnosis not present

## 2021-06-27 DIAGNOSIS — L578 Other skin changes due to chronic exposure to nonionizing radiation: Secondary | ICD-10-CM | POA: Diagnosis not present

## 2021-06-27 DIAGNOSIS — L720 Epidermal cyst: Secondary | ICD-10-CM

## 2021-06-27 NOTE — Patient Instructions (Signed)

## 2021-06-27 NOTE — Progress Notes (Signed)
   Follow-Up Visit   Subjective  Joseph May is a 60 y.o. male who presents for the following: Actinic Keratosis (Of the scalp S/P 5FU/Calcipotriene mix and LN2 ) and post op/suture removal (Of the L upper back paraspinal - benign cyst, pathology proven).  The following portions of the chart were reviewed this encounter and updated as appropriate:   Tobacco  Allergies  Meds  Problems  Med Hx  Surg Hx  Fam Hx     Review of Systems:  No other skin or systemic complaints except as noted in HPI or Assessment and Plan.  Objective  Well appearing patient in no apparent distress; mood and affect are within normal limits.  A focused examination was performed including the face, scalp, and trunk. Relevant physical exam findings are noted in the Assessment and Plan.  Scalp x 2 (2) Erythematous thin papules/macules with gritty scale.   L upper back paraspinal Healing excision site.   Assessment & Plan  AK (actinic keratosis) (2) Scalp x 2 Destruction of lesion - Scalp x 2 Complexity: simple   Destruction method: cryotherapy   Informed consent: discussed and consent obtained   Timeout:  patient name, date of birth, surgical site, and procedure verified Lesion destroyed using liquid nitrogen: Yes   Region frozen until ice ball extended beyond lesion: Yes   Outcome: patient tolerated procedure well with no complications   Post-procedure details: wound care instructions given    Epidermal inclusion cyst L upper back paraspinal Encounter for Removal of Sutures - Incision site at the L upper back paraspinal is clean, dry and intact - Wound cleansed, sutures removed, wound cleansed and steri strips applied.  - Discussed pathology results showing a benign cyst.  - Patient advised to keep steri-strips dry until they fall off. - Scars remodel for a full year. - Once steri-strips fall off, patient can apply over-the-counter silicone scar cream each night to help with scar remodeling if  desired. - Patient advised to call with any concerns or if they notice any new or changing lesions.  Actinic Damage - chronic, secondary to cumulative UV radiation exposure/sun exposure over time - diffuse scaly erythematous macules with underlying dyspigmentation - Recommend daily broad spectrum sunscreen SPF 30+ to sun-exposed areas, reapply every 2 hours as needed.  - Recommend staying in the shade or wearing long sleeves, sun glasses (UVA+UVB protection) and wide brim hats (4-inch brim around the entire circumference of the hat). - Call for new or changing lesions.  History of Dysplastic Nevi - No evidence of recurrence today - Recommend regular full body skin exams - Recommend daily broad spectrum sunscreen SPF 30+ to sun-exposed areas, reapply every 2 hours as needed.  - Call if any new or changing lesions are noted between office visits  Return in about 6 months (around 12/26/2021) for AK f/u.  Luther Redo, CMA, am acting as scribe for Sarina Ser, MD . Documentation: I have reviewed the above documentation for accuracy and completeness, and I agree with the above.  Sarina Ser, MD

## 2021-09-13 ENCOUNTER — Other Ambulatory Visit: Payer: Self-pay | Admitting: Oncology

## 2021-09-13 DIAGNOSIS — C649 Malignant neoplasm of unspecified kidney, except renal pelvis: Secondary | ICD-10-CM

## 2021-10-06 ENCOUNTER — Other Ambulatory Visit
Admission: RE | Admit: 2021-10-06 | Discharge: 2021-10-06 | Disposition: A | Discharge status: Home / Self Care | Payer: BC Managed Care – PPO | Source: Ambulatory Visit | Attending: Oncology | Admitting: Oncology

## 2021-10-06 DIAGNOSIS — C649 Malignant neoplasm of unspecified kidney, except renal pelvis: Secondary | ICD-10-CM | POA: Insufficient documentation

## 2021-10-06 LAB — CBC WITH DIFFERENTIAL/PLATELET
Abs Immature Granulocytes: 0.01 10*3/uL (ref 0.00–0.07)
Basophils Absolute: 0.1 10*3/uL (ref 0.0–0.1)
Basophils Relative: 1 %
Eosinophils Absolute: 0.1 10*3/uL (ref 0.0–0.5)
Eosinophils Relative: 2 %
HCT: 40.4 % (ref 39.0–52.0)
Hemoglobin: 14 g/dL (ref 13.0–17.0)
Immature Granulocytes: 0 %
Lymphocytes Relative: 24 %
Lymphs Abs: 1.4 10*3/uL (ref 0.7–4.0)
MCH: 31.1 pg (ref 26.0–34.0)
MCHC: 34.7 g/dL (ref 30.0–36.0)
MCV: 89.8 fL (ref 80.0–100.0)
Monocytes Absolute: 0.6 10*3/uL (ref 0.1–1.0)
Monocytes Relative: 11 %
Neutro Abs: 3.6 10*3/uL (ref 1.7–7.7)
Neutrophils Relative %: 62 %
Platelets: 184 10*3/uL (ref 150–400)
RBC: 4.5 MIL/uL (ref 4.22–5.81)
RDW: 12.3 % (ref 11.5–15.5)
WBC: 5.9 10*3/uL (ref 4.0–10.5)
nRBC: 0 % (ref 0.0–0.2)

## 2021-10-06 LAB — COMPREHENSIVE METABOLIC PANEL
ALT: 19 U/L (ref 0–44)
AST: 20 U/L (ref 15–41)
Albumin: 4.2 g/dL (ref 3.5–5.0)
Alkaline Phosphatase: 70 U/L (ref 38–126)
Anion gap: 8 (ref 5–15)
BUN: 15 mg/dL (ref 6–20)
CO2: 27 mmol/L (ref 22–32)
Calcium: 9.1 mg/dL (ref 8.9–10.3)
Chloride: 102 mmol/L (ref 98–111)
Creatinine, Ser: 1.14 mg/dL (ref 0.61–1.24)
GFR, Estimated: >60 mL/min (ref >60)
Glucose, Bld: 94 mg/dL (ref 70–99)
Potassium: 4 mmol/L (ref 3.5–5.1)
Sodium: 137 mmol/L (ref 135–145)
Total Bilirubin: 0.9 mg/dL (ref 0.3–1.2)
Total Protein: 7 g/dL (ref 6.5–8.1)

## 2021-10-11 ENCOUNTER — Other Ambulatory Visit: Payer: Self-pay

## 2021-10-11 ENCOUNTER — Ambulatory Visit
Admission: RE | Admit: 2021-10-11 | Discharge: 2021-10-11 | Disposition: A | Discharge status: Home / Self Care | Payer: BC Managed Care – PPO | Source: Ambulatory Visit | Attending: Oncology | Admitting: Oncology

## 2021-10-11 DIAGNOSIS — K5730 Diverticulosis of large intestine without perforation or abscess without bleeding: Secondary | ICD-10-CM | POA: Diagnosis not present

## 2021-10-11 DIAGNOSIS — C649 Malignant neoplasm of unspecified kidney, except renal pelvis: Secondary | ICD-10-CM

## 2021-10-11 DIAGNOSIS — R911 Solitary pulmonary nodule: Secondary | ICD-10-CM | POA: Diagnosis not present

## 2021-10-11 DIAGNOSIS — C642 Malignant neoplasm of left kidney, except renal pelvis: Secondary | ICD-10-CM | POA: Diagnosis not present

## 2021-10-11 MED ORDER — IOHEXOL 300 MG/ML  SOLN
100.0000 mL | Freq: Once | INTRAMUSCULAR | Status: AC | PRN
  Administered 2021-10-11: 100 mL via INTRAVENOUS

## 2021-10-12 ENCOUNTER — Inpatient Hospital Stay: Payer: BC Managed Care – PPO | Attending: Oncology | Admitting: Oncology

## 2021-10-12 VITALS — BP 152/75 | HR 75 | Temp 98.1°F | Resp 17 | Ht 70.0 in | Wt 204.7 lb

## 2021-10-12 DIAGNOSIS — Z7951 Long term (current) use of inhaled steroids: Secondary | ICD-10-CM | POA: Insufficient documentation

## 2021-10-12 DIAGNOSIS — C649 Malignant neoplasm of unspecified kidney, except renal pelvis: Secondary | ICD-10-CM

## 2021-10-12 DIAGNOSIS — Z7952 Long term (current) use of systemic steroids: Secondary | ICD-10-CM | POA: Insufficient documentation

## 2021-10-12 DIAGNOSIS — G629 Polyneuropathy, unspecified: Secondary | ICD-10-CM | POA: Insufficient documentation

## 2021-10-12 DIAGNOSIS — Z905 Acquired absence of kidney: Secondary | ICD-10-CM | POA: Insufficient documentation

## 2021-10-12 DIAGNOSIS — Z85528 Personal history of other malignant neoplasm of kidney: Secondary | ICD-10-CM | POA: Insufficient documentation

## 2021-10-12 NOTE — Progress Notes (Signed)
Hematology and Oncology Follow Up Visit  DELMON ANDRADA [id number] [birth year] 61 y.o. 10/12/2021 9:14 AM [name], Leticia Penna, NPClark, Leticia Penna, NP   Principle Diagnosis: 61 year old man with T3N0 clear-cell renal cell carcinoma diagnosed in 2016.   Prior Therapy: He is status post laparoscopic left radical nephrectomy done on 06/30/2015. The pathology revealed clear cell histology nuclear grade 4 measuring 3.6 cm.   Current therapy: Active surveillance.  Interim History: Mr. Klingerman presents today for a follow-up visit.  Since the last visit, he reports no major changes in his health.  He continues to be active and attends activities of daily living including working full-time.  He does report residual neuropathy in his lower extremity but has not changed at this time and it does not interfere with function.  Denies any hematochezia, melena or flank pain.  His performance status quality of life remains unchanged.     Medications: Updated on review. Current Outpatient Medications  Medication Sig Dispense Refill   acetaminophen (TYLENOL) 500 MG tablet Take 2 tablets (1,000 mg total) by mouth every 6 (six) hours as needed for mild pain or moderate pain. 30 tablet 0   albuterol (PROVENTIL HFA;VENTOLIN HFA) 108 (90 Base) MCG/ACT inhaler Inhale 2 puffs into the lungs every 4 (four) hours as needed for wheezing or shortness of breath. 1 Inhaler 0   azithromycin (ZITHROMAX) 250 MG tablet Take 2 tablets PO on day one, and one tablet PO daily thereafter until completed. 6 tablet 0   benzonatate (TESSALON) 100 MG capsule Take 1 capsule (100 mg total) by mouth 3 (three) times daily as needed for cough. 30 capsule 0   FLOVENT HFA 110 MCG/ACT inhaler TAKE 1 PUFF BY MOUTH TWICE A DAY 12 Inhaler 0   mupirocin ointment (BACTROBAN) 2 % Apply 1 application topically daily. With dressing changes 22 g 0   predniSONE (STERAPRED UNI-PAK 21 TAB) 10 MG (21) TBPK tablet 6 day taper; take as directed on package  instructions 21 tablet 0   No current facility-administered medications for this visit.     Allergies:  Allergies  Allergen Reactions   No Known Allergies        Physical Exam:    Blood pressure (!) 152/75, pulse 75, temperature 98.1 F (36.7 C), temperature source Temporal, resp. rate 17, height 5\' 10"  (1.778 m), weight 204 lb 11.2 oz (92.9 kg), SpO2 100 %.     ECOG: 0     General appearance: Alert, awake without any distress. Head: Atraumatic without abnormalities Oropharynx: Without any thrush or ulcers. Eyes: No scleral icterus. Lymph nodes: No lymphadenopathy noted in the cervical, supraclavicular, or axillary nodes Heart:regular rate and rhythm, without any murmurs or gallops.   Lung: Clear to auscultation without any rhonchi, wheezes or dullness to percussion. Abdomin: Soft, nontender without any shifting dullness or ascites. Musculoskeletal: No clubbing or cyanosis. Neurological: No motor or sensory deficits. Skin: No rashes or lesions. Psychiatric: Mood and affect appeared normal.      Lab Results: Lab Results  Component Value Date   WBC 5.9 10/06/2021   HGB 14.0 10/06/2021   HCT 40.4 10/06/2021   MCV 89.8 10/06/2021   PLT 184 10/06/2021     Chemistry      Component Value Date/Time   NA 137 10/06/2021 1255   NA 139 06/28/2017 0829   K 4.0 10/06/2021 1255   K 4.4 06/28/2017 0829   CL 102 10/06/2021 1255   CO2 27 10/06/2021 1255   CO2 27 06/28/2017 0829  BUN 15 10/06/2021 1255   BUN 15.2 06/28/2017 0829   CREATININE 1.14 10/06/2021 1255   CREATININE 1.3 06/28/2017 0829      Component Value Date/Time   CALCIUM 9.1 10/06/2021 1255   CALCIUM 9.5 06/28/2017 0829   ALKPHOS 70 10/06/2021 1255   ALKPHOS 82 06/28/2017 0829   AST 20 10/06/2021 1255   AST 15 06/28/2017 0829   ALT 19 10/06/2021 1255   ALT 19 06/28/2017 0829   BILITOT 0.9 10/06/2021 1255   BILITOT 0.57 06/28/2017 0829        IMPRESSION: 1. Stable CT of the chest,  abdomen and pelvis. No evidence for recurrent or metastatic disease. 2. Status post left nephrectomy.         61 year old man with:   1.   T3N0 clear-cell renal cell carcinoma diagnosed in 2016.    He is currently on active surveillance without any evidence of relapsed disease.  CT scan obtained on October 11, 2021 was reviewed and showed no evidence of disease relapse.  Risks and benefits of continued active surveillance at this time were discussed.  Salvage therapy options including immunotherapy and oral targeted therapy were reviewed.  The frequency of imaging studies were reviewed today and after discussion, he is agreeable to continue and we will repeated in 10 months.   2.  Age-appropriate cancer screening: I recommended obtaining a colonoscopy.   3. Follow-up: In 10 months for repeat follow-up.  30  minutes were spent on this visit.  The time was dedicated to reviewing laboratory data, imaging studies, treatment choices and future plan of care discussion.   Zola Button, MD 1/19/20239:14 AM

## 2021-12-27 ENCOUNTER — Ambulatory Visit: Payer: BC Managed Care – PPO | Admitting: Dermatology

## 2021-12-27 DIAGNOSIS — L570 Actinic keratosis: Secondary | ICD-10-CM

## 2021-12-27 DIAGNOSIS — L814 Other melanin hyperpigmentation: Secondary | ICD-10-CM

## 2021-12-27 DIAGNOSIS — L578 Other skin changes due to chronic exposure to nonionizing radiation: Secondary | ICD-10-CM | POA: Diagnosis not present

## 2021-12-27 NOTE — Patient Instructions (Addendum)
- Start 5-fluorouracil/calcipotriene cream twice a day for 7 days to affected areas including scalp. Prescription sent to [facility]. Patient advised they will receive an email to purchase the medication online and have it sent to their home. Patient provided with handout reviewing treatment course and side effects and advised to call or message [place] on MyChart with any concerns. ? ? ?If You Need Anything After Your Visit ? ?If you have any questions or concerns for your doctor, please call our main line at [phone] and press option 4 to reach your doctor's medical assistant. If no one answers, please leave a voicemail as directed and we will return your call as soon as possible. Messages left after 4 pm will be answered the following business day.  ? ?You may also send [place] a message via MyChart. We typically respond to MyChart messages within 1-2 business days. ? ?For prescription refills, please ask your pharmacy to contact our office. Our fax number is [phone]. ? ?If you have an urgent issue when the clinic is closed that cannot wait until the next business day, you can page your doctor at the number below.   ? ?Please note that while we do our best to be available for urgent issues outside of office hours, we are not available 24/7.  ? ?If you have an urgent issue and are unable to reach [place], you may choose to seek medical care at your doctor's office, retail clinic, urgent care center, or emergency room. ? ?If you have a medical emergency, please immediately call 911 or go to the emergency department. ? ?Pager Numbers ? ?- Dr. Nehemiah Massed: [phone] ? ?- Dr. Laurence Ferrari: [phone] ? ?- Dr. Nicole Kindred: [phone] ? ?In the event of inclement weather, please call our main line at [phone] for an update on the status of any delays or closures. ? ?Dermatology Medication Tips: ?Please keep the boxes that topical medications come in in order to help keep track of the instructions about  where and how to use these. Pharmacies typically print the medication instructions only on the boxes and not directly on the medication tubes.  ? ?If your medication is too expensive, please contact our office at [phone] option 4 or send [place] a message through [place].  ? ?We are unable to tell what your co-pay for medications will be in advance as this is different depending on your insurance coverage. However, we may be able to find a substitute medication at lower cost or fill out paperwork to get insurance to cover a needed medication.  ? ?If a prior authorization is required to get your medication covered by your insurance company, please allow [place] 1-2 business days to complete this process. ? ?Drug prices often vary depending on where the prescription is filled and some pharmacies may offer cheaper prices. ? ?The website www.goodrx.com contains coupons for medications through different pharmacies. The prices here do not account for what the cost may be with help from insurance (it may be cheaper with your insurance), but the website can give you the price if you did not use any insurance.  ?- You can print the associated coupon and take it with your prescription to the pharmacy.  ?- You may also stop by our office during regular business hours and pick up a GoodRx coupon card.  ?- If you need your prescription sent electronically to a different pharmacy, notify our office through [facility] or by phone at [phone] option 4. ? ? ? ? ?  Si Usted Necesita Algo Despu?s de Su Visita ? ?Tambi?n puede enviarnos un mensaje a trav?s de MyChart. Por lo general respondemos a los mensajes de MyChart en el transcurso de 1 a 2 d?as h?biles. ? ?Para renovar recetas, por favor pida a su farmacia que se ponga en contacto con nuestra oficina. Nuestro n?mero de fax es el [phone]. ? ?Si tiene un asunto urgente cuando la cl?nica est? cerrada y que no puede esperar hasta el siguiente d?a h?bil, puede  llamar/localizar a su doctor(a) al n?mero que aparece a continuaci?n.  ? ?Por favor, tenga en cuenta que aunque hacemos todo lo posible para estar disponibles para asuntos urgentes fuera del horario de oficina, no estamos disponibles las 24 horas del d?a, los 7 d?as de la semana.  ? ?Si tiene un problema urgente y no puede comunicarse con nosotros, puede optar por buscar atenci?n m?dica  en el consultorio de su doctor(a), en una cl?nica privada, en un centro de atenci?n urgente o en una sala de emergencias. ? ?Si tiene Engineer, maintenance (IT) m?dica, por favor llame inmediatamente al 911 o vaya a la sala de emergencias. ? ?N?meros de b?per ? ?- Dr. Nehemiah Massed: [phone] ? ?- Dra. Moye: [phone] ? ?- Dra. Nicole Kindred: [phone] ? ?En caso de inclemencias del tiempo, por favor llame a nuestra l?nea principal al [phone] para una actualizaci?n sobre el estado de cualquier retraso o cierre. ? ?Consejos para la medicaci?n en dermatolog?a: ?Por favor, guarde las cajas en las que vienen los medicamentos de uso t?pico para ayudarle a seguir las instrucciones sobre d?nde y c?mo usarlos. Las farmacias generalmente imprimen las instrucciones del medicamento s?lo en las cajas y no directamente en los tubos del [place].  ? ?Si su medicamento es muy caro, por favor, p?ngase en contacto con Zigmund Daniel llamando al [phone] y presione la opci?n 4 o env?enos un mensaje a trav?s de MyChart.  ? ?No podemos decirle cu?l ser? su copago por los medicamentos por adelantado ya que esto es diferente dependiendo de la cobertura de su seguro. Sin embargo, es posible que podamos encontrar un medicamento sustituto a Electrical engineer un formulario para que el seguro cubra el medicamento que se considera necesario.  ? ?Si se requiere Ardelia Mems autorizaci?n previa para que su compa??a de seguros [place] su medicamento, por favor perm?tanos de 1 a 2 d?as h?biles para completar este proceso. ? ?Los precios de los medicamentos var?an con  frecuencia dependiendo del Environmental consultant de d?nde se surte la receta y alguna farmacias pueden ofrecer precios m?s baratos. ? ?El sitio web www.goodrx.com tiene cupones para medicamentos de Airline pilot. Los precios aqu? no tienen en cuenta lo que podr?a costar con la ayuda del seguro (puede ser m?s barato con su seguro), pero el sitio web puede darle el precio si no utiliz? ning?n seguro.  ?- Puede imprimir el cup?n correspondiente y llevarlo con su receta a la farmacia.  ?- Tambi?n puede pasar por nuestra oficina durante el horario de atenci?n regular y recoger una tarjeta de cupones de GoodRx.  ?- Si necesita que su receta se env?e electr?nicamente a Chiropodist, informe a nuestra oficina a trav?s de MyChart de [place] o por tel?fono llamando al [phone] y presione la opci?n 4. ? ? ?

## 2021-12-27 NOTE — Progress Notes (Signed)
? ?  Follow-Up Visit ?  ?Subjective  ?Joseph May is a 61 y.o. male who presents for the following: Follow-up (Patient here today for 6 month AK at the scalp follow up. Patient had AK's treated with LN2 and treated at home with 5FU/calcipotriene.). ?Patient advises he did have some redness and scabs after using 5FU/calcipotriene but reaction was not severe.  ?The patient has spots, moles and lesions to be evaluated, some may be new or changing and the patient has concerns that these could be cancer. ? ?The following portions of the chart were reviewed this encounter and updated as appropriate:  ? Tobacco  Allergies  Meds  Problems  Med Hx  Surg Hx  Fam Hx   ?  ?Review of Systems:  No other skin or systemic complaints except as noted in HPI or Assessment and Plan. ? ?Objective  ?Well appearing patient in no apparent distress; mood and affect are within normal limits. ? ?A focused examination was performed including face, scalp. Relevant physical exam findings are noted in the Assessment and Plan. ? ? ?Assessment & Plan  ?Keratosis, actinic ? ?Lentigo ? ? ?Actinic Damage - Severe, confluent actinic changes with pre-cancerous actinic keratoses  ?- Severe, chronic, not at goal, secondary to cumulative UV radiation exposure over time ?- diffuse scaly erythematous macules and papules with underlying dyspigmentation ?- Discussed Prescription "Field Treatment" for Severe, Chronic Confluent Actinic Changes with Pre-Cancerous Actinic Keratoses ?Field treatment involves treatment of an entire area of skin that has confluent Actinic Changes (Sun/ Ultraviolet light damage) and PreCancerous Actinic Keratoses by method of PhotoDynamic Therapy (PDT) and/or prescription Topical Chemotherapy agents such as 5-fluorouracil, 5-fluorouracil/calcipotriene, and/or imiquimod.  The purpose is to decrease the number of clinically evident and subclinical PreCancerous lesions to prevent progression to development of skin cancer by  chemically destroying early precancer changes that may or may not be visible.  It has been shown to reduce the risk of developing skin cancer in the treated area. As a result of treatment, redness, scaling, crusting, and open sores may occur during treatment course. One or more than one of these methods may be used and may have to be used several times to control, suppress and eliminate the PreCancerous changes. Discussed treatment course, expected reaction, and possible side effects. ?- Recommend daily broad spectrum sunscreen SPF 30+ to sun-exposed areas, reapply every 2 hours as needed.  ?- Staying in the shade or wearing long sleeves, sun glasses (UVA+UVB protection) and wide brim hats (4-inch brim around the entire circumference of the hat) are also recommended. ?- Call for new or changing lesions. ? ?- Start 5-fluorouracil/calcipotriene cream twice a day for 7 days to affected areas including scalp. Prescription sent to [facility]. Patient advised they will receive an email to purchase the medication online and have it sent to their home. Patient provided with handout reviewing treatment course and side effects and advised to call or message [place] on MyChart with any concerns. ? ?Lentigines ?- Scattered tan macules ?- Due to sun exposure ?- Benign-appering, observe ?- Recommend daily broad spectrum sunscreen SPF 30+ to sun-exposed areas, reapply every 2 hours as needed. ?- Call for any changes ? ?Return today (on 12/27/2021) for TBSE, AK follow up. ? ?Graciella Belton, RMA, am acting as scribe for Sarina Ser, MD . ?Documentation: I have reviewed the above documentation for accuracy and completeness, and I agree with the above. ? ?Sarina Ser, MD ? ?

## 2021-12-28 ENCOUNTER — Encounter: Payer: Self-pay | Admitting: Dermatology

## 2022-01-24 ENCOUNTER — Telehealth: Payer: BC Managed Care – PPO | Admitting: Physician Assistant

## 2022-01-24 DIAGNOSIS — J028 Acute pharyngitis due to other specified organisms: Secondary | ICD-10-CM

## 2022-01-24 DIAGNOSIS — B9689 Other specified bacterial agents as the cause of diseases classified elsewhere: Secondary | ICD-10-CM | POA: Diagnosis not present

## 2022-01-24 MED ORDER — AMOXICILLIN 500 MG PO CAPS: 500.0000 mg | ORAL_CAPSULE | Freq: Two times a day (BID) | ORAL | 0 refills | Status: AC

## 2022-01-24 NOTE — Progress Notes (Signed)

## 2022-07-05 ENCOUNTER — Other Ambulatory Visit: Payer: Self-pay | Admitting: *Deleted

## 2022-07-05 DIAGNOSIS — C649 Malignant neoplasm of unspecified kidney, except renal pelvis: Secondary | ICD-10-CM

## 2022-07-20 ENCOUNTER — Other Ambulatory Visit: Payer: Self-pay | Admitting: *Deleted

## 2022-07-20 DIAGNOSIS — C649 Malignant neoplasm of unspecified kidney, except renal pelvis: Secondary | ICD-10-CM

## 2022-07-23 ENCOUNTER — Other Ambulatory Visit
Admission: RE | Admit: 2022-07-23 | Discharge: 2022-07-23 | Disposition: A | Discharge status: Home / Self Care | Payer: BC Managed Care – PPO | Attending: Oncology | Admitting: Oncology

## 2022-07-23 ENCOUNTER — Other Ambulatory Visit: Payer: Self-pay | Admitting: *Deleted

## 2022-07-23 DIAGNOSIS — C649 Malignant neoplasm of unspecified kidney, except renal pelvis: Secondary | ICD-10-CM | POA: Insufficient documentation

## 2022-07-23 LAB — COMPREHENSIVE METABOLIC PANEL
ALT: 16 U/L (ref 0–44)
AST: 17 U/L (ref 15–41)
Albumin: 4.1 g/dL (ref 3.5–5.0)
Alkaline Phosphatase: 69 U/L (ref 38–126)
Anion gap: 8 (ref 5–15)
BUN: 15 mg/dL (ref 6–20)
CO2: 28 mmol/L (ref 22–32)
Calcium: 9 mg/dL (ref 8.9–10.3)
Chloride: 104 mmol/L (ref 98–111)
Creatinine, Ser: 1.24 mg/dL (ref 0.61–1.24)
GFR, Estimated: >60 mL/min (ref >60)
Glucose, Bld: 110 mg/dL — ABNORMAL HIGH (ref 70–99)
Potassium: 4.1 mmol/L (ref 3.5–5.1)
Sodium: 140 mmol/L (ref 135–145)
Total Bilirubin: 0.7 mg/dL (ref 0.3–1.2)
Total Protein: 7 g/dL (ref 6.5–8.1)

## 2022-07-23 LAB — CBC WITH DIFFERENTIAL/PLATELET
Abs Immature Granulocytes: 0.01 10*3/uL (ref 0.00–0.07)
Basophils Absolute: 0 10*3/uL (ref 0.0–0.1)
Basophils Relative: 0 %
Eosinophils Absolute: 0.1 10*3/uL (ref 0.0–0.5)
Eosinophils Relative: 1 %
HCT: 41.4 % (ref 39.0–52.0)
Hemoglobin: 13.9 g/dL (ref 13.0–17.0)
Immature Granulocytes: 0 %
Lymphocytes Relative: 20 %
Lymphs Abs: 1 10*3/uL (ref 0.7–4.0)
MCH: 29.6 pg (ref 26.0–34.0)
MCHC: 33.6 g/dL (ref 30.0–36.0)
MCV: 88.3 fL (ref 80.0–100.0)
Monocytes Absolute: 0.5 10*3/uL (ref 0.1–1.0)
Monocytes Relative: 10 %
Neutro Abs: 3.5 10*3/uL (ref 1.7–7.7)
Neutrophils Relative %: 69 %
Platelets: 169 10*3/uL (ref 150–400)
RBC: 4.69 MIL/uL (ref 4.22–5.81)
RDW: 13 % (ref 11.5–15.5)
WBC: 5.1 10*3/uL (ref 4.0–10.5)
nRBC: 0 % (ref 0.0–0.2)

## 2022-07-25 ENCOUNTER — Ambulatory Visit: Payer: BC Managed Care – PPO

## 2022-07-25 ENCOUNTER — Ambulatory Visit
Admission: RE | Admit: 2022-07-25 | Discharge: 2022-07-25 | Disposition: A | Discharge status: Home / Self Care | Payer: BC Managed Care – PPO | Source: Ambulatory Visit | Attending: Oncology | Admitting: Oncology

## 2022-07-25 ENCOUNTER — Ambulatory Visit: Admission: RE | Admit: 2022-07-25 | Payer: BC Managed Care – PPO | Source: Ambulatory Visit

## 2022-07-25 DIAGNOSIS — K7689 Other specified diseases of liver: Secondary | ICD-10-CM | POA: Diagnosis not present

## 2022-07-25 DIAGNOSIS — R911 Solitary pulmonary nodule: Secondary | ICD-10-CM | POA: Diagnosis not present

## 2022-07-25 DIAGNOSIS — C649 Malignant neoplasm of unspecified kidney, except renal pelvis: Secondary | ICD-10-CM | POA: Insufficient documentation

## 2022-07-25 DIAGNOSIS — K5730 Diverticulosis of large intestine without perforation or abscess without bleeding: Secondary | ICD-10-CM | POA: Diagnosis not present

## 2022-07-25 MED ORDER — IOHEXOL 300 MG/ML  SOLN
100.0000 mL | Freq: Once | INTRAMUSCULAR | Status: AC | PRN
  Administered 2022-07-25: 100 mL via INTRAVENOUS

## 2022-08-03 ENCOUNTER — Other Ambulatory Visit: Payer: Self-pay

## 2022-08-03 ENCOUNTER — Inpatient Hospital Stay: Payer: BC Managed Care – PPO | Attending: Oncology | Admitting: Oncology

## 2022-08-03 VITALS — BP 152/81 | HR 92 | Temp 98.1°F | Resp 17 | Ht 70.0 in | Wt 200.2 lb

## 2022-08-03 DIAGNOSIS — Z905 Acquired absence of kidney: Secondary | ICD-10-CM | POA: Diagnosis not present

## 2022-08-03 DIAGNOSIS — C649 Malignant neoplasm of unspecified kidney, except renal pelvis: Secondary | ICD-10-CM | POA: Diagnosis not present

## 2022-08-03 DIAGNOSIS — Z85528 Personal history of other malignant neoplasm of kidney: Secondary | ICD-10-CM | POA: Diagnosis not present

## 2022-08-03 NOTE — Progress Notes (Signed)
Hematology and Oncology Follow Up Visit  Joseph May [id number] 21-Dec-1960 61 y.o. 08/03/2022 1:13 PM Joseph May Joseph May, NPClark, Joseph Penna, NP   Principle Diagnosis: 61 year old man with kidney cancer diagnosed in 2016. He was found to haveT3N0 clear-cell.   Prior Therapy: He is status post laparoscopic left radical nephrectomy done on 06/30/2015. The pathology revealed clear cell histology nuclear grade 4 measuring 3.6 cm.  He declined adjuvant therapy at that time.  Current therapy: Active surveillance.  Interim History: [name] returns for follow up. Since the last visit, he reports no major changes in his health.  He denies any nausea, vomiting or abdominal pain.  He denies any excessive fatigue or tiredness.  He denies any weight loss or appetite changes.  He continues to be active without any decline in ability to perform work-related duties.     Medications: Reviewed without changes.  Current Outpatient Medications  Medication Sig Dispense Refill   acetaminophen (TYLENOL) 500 MG tablet Take 2 tablets (1,000 mg total) by mouth every 6 (six) hours as needed for mild pain or moderate pain. 30 tablet 0   albuterol (PROVENTIL HFA;VENTOLIN HFA) 108 (90 Base) MCG/ACT inhaler Inhale 2 puffs into the lungs every 4 (four) hours as needed for wheezing or shortness of breath. 1 Inhaler 0   FLOVENT HFA 110 MCG/ACT inhaler TAKE 1 PUFF BY MOUTH TWICE A DAY 12 Inhaler 0   mupirocin ointment (BACTROBAN) 2 % Apply 1 application topically daily. With dressing changes 22 g 0   No current facility-administered medications for this visit.     Allergies:  Allergies  Allergen Reactions   No Known Allergies        Physical Exam:         ECOG: 0    General appearance: Comfortable appearing without any discomfort Head: Normocephalic without any trauma Oropharynx: Mucous membranes are moist and pink without any thrush or ulcers. Eyes: Pupils are equal and round reactive to  light. Lymph nodes: No cervical, supraclavicular, inguinal or axillary lymphadenopathy.   Heart:regular rate and rhythm.  S1 and S2 without leg edema. Lung: Clear without any rhonchi or wheezes.  No dullness to percussion. Abdomin: Soft, nontender, nondistended with good bowel sounds.  No hepatosplenomegaly. Musculoskeletal: No joint deformity or effusion.  Full range of motion noted. Neurological: No deficits noted on motor, sensory and deep tendon reflex exam. Skin: No petechial rash or dryness.  Appeared moist.        Lab Results: Lab Results  Component Value Date   WBC 5.1 07/23/2022   HGB 13.9 07/23/2022   HCT 41.4 07/23/2022   MCV 88.3 07/23/2022   PLT 169 07/23/2022     Chemistry      Component Value Date/Time   NA 140 07/23/2022 0845   NA 139 06/28/2017 0829   K 4.1 07/23/2022 0845   K 4.4 06/28/2017 0829   CL 104 07/23/2022 0845   CO2 28 07/23/2022 0845   CO2 27 06/28/2017 0829   BUN 15 07/23/2022 0845   BUN 15.2 06/28/2017 0829   CREATININE 1.24 07/23/2022 0845   CREATININE 1.3 06/28/2017 0829      Component Value Date/Time   CALCIUM 9.0 07/23/2022 0845   CALCIUM 9.5 06/28/2017 0829   ALKPHOS 69 07/23/2022 0845   ALKPHOS 82 06/28/2017 0829   AST 17 07/23/2022 0845   AST 15 06/28/2017 0829   ALT 16 07/23/2022 0845   ALT 19 06/28/2017 0829   BILITOT 0.7 07/23/2022 0845  BILITOT 0.57 06/28/2017 4650         Narrative & Impression  CLINICAL DATA:  Restaging of the left renal RCC status post nephrectomy October 2016. * Tracking Code: BO *   EXAM: CT CHEST WITH CONTRAST   CT ABDOMEN AND PELVIS WITH AND WITHOUT CONTRAST   TECHNIQUE: Multidetector CT imaging of the chest was performed during intravenous contrast administration. Multidetector CT imaging of the abdomen and pelvis was performed following the standard protocol before and during bolus administration of intravenous contrast.   RADIATION DOSE REDUCTION: This exam was performed according  to the departmental dose-optimization program which includes automated exposure control, adjustment of the mA and/or kV according to patient size and/or use of iterative reconstruction technique.   CONTRAST:  140m OMNIPAQUE IOHEXOL 300 MG/ML  SOLN   COMPARISON:  Multiple priors including most recent CT October 11, 2021.   FINDINGS: CT CHEST FINDINGS   Cardiovascular: Normal caliber abdominal aorta. No central pulmonary embolus on this nondedicated study. Normal size heart. No significant pericardial effusion/thickening.   Mediastinum/Nodes: No supraclavicular adenopathy. No suspicious thyroid nodule. No pathologically enlarged mediastinal, hilar or axillary lymph nodes. The esophagus is grossly unremarkable.   Lungs/Pleura: Stable 4 mm subpleural pulmonary nodule in the posterior left lower lobe on image 120/10. Posterior right lower lobe calcified granuloma. No new suspicious pulmonary nodules or masses. No pleural effusion. No pneumothorax.   Musculoskeletal: Partially visualized cervical fusion hardware. No aggressive lytic or blastic lesion of bone. Multilevel degenerative change of the spine.   CT ABDOMEN AND PELVIS FINDINGS   Hepatobiliary: Stable segment IV a hepatic cyst. No suspicious hepatic lesion. Gallbladder is unremarkable. No biliary ductal dilation.   Pancreas: No pancreatic ductal dilation or evidence of acute inflammation.   Spleen: No splenomegaly.   Adrenals/Urinary Tract: Bilateral adrenal glands appear normal.   Left kidney surgically absent without new suspicious enhancing nodularity in the nephrectomy bed.   No right-sided hydronephrosis or suspicious renal mass. Right kidney demonstrates normal enhancement and excretion of contrast material.   Urinary bladder is unremarkable for degree of distension.   Stomach/Bowel: Stomach is unremarkable for degree of distension. No pathologic dilation of small or large bowel. No evidence of  acute bowel inflammation. Left-sided colonic diverticulosis without findings of acute diverticulitis.   Vascular/Lymphatic: Normal caliber abdominal aorta. Congenital duplication of the IVC. No pathologically enlarged abdominal or pelvic lymph nodes.   Reproductive: Prostate is unremarkable.   Other: No significant abdominopelvic free fluid.   Musculoskeletal: No aggressive lytic or blastic lesion of bone. Multilevel degenerative changes spine.   IMPRESSION: 1. Stable examination status post left nephrectomy without evidence of local recurrence or metastatic disease in the chest, abdomen or pelvis. 2. Left-sided colonic diverticulosis without findings of acute diverticulitis.         61year old man with:   1.   Kidney cancer diagnosed in 2016.  He was found to have T3N0 clear-cell tumor.  The natural course of this disease and risk of relapse was discussed at this time.  He continues to have no evidence of relapse based on imaging studies obtained in November 2023.  I recommended continued active surveillance and only use systemic therapy if he has evidence of relapse.  He will have repeat imaging studies in 10 months from now and annually after that to complete 10 years.    2. Follow-up: He will follow-up in 10 months for repeat evaluation.  He will [facility] care [facility] due to geographical  preferences.  30  minutes were spent on this encounter.  The time was dedicated to reviewing laboratory data, disease status update, treatment choices and outlining future plan of care review.   Zola Button, MD 11/10/20231:13 PM

## 2022-08-10 ENCOUNTER — Encounter: Payer: Self-pay | Admitting: Oncology

## 2022-08-24 ENCOUNTER — Other Ambulatory Visit: Payer: Self-pay | Admitting: *Deleted

## 2022-08-24 DIAGNOSIS — C649 Malignant neoplasm of unspecified kidney, except renal pelvis: Secondary | ICD-10-CM

## 2022-08-24 NOTE — Progress Notes (Signed)
Referral placed to [facility] per Patient and Dr Hazeline Junker request

## 2022-12-13 ENCOUNTER — Telehealth: Payer: BC Managed Care – PPO | Admitting: Physician Assistant

## 2022-12-13 DIAGNOSIS — J069 Acute upper respiratory infection, unspecified: Secondary | ICD-10-CM | POA: Diagnosis not present

## 2022-12-13 MED ORDER — FLUTICASONE PROPIONATE 50 MCG/ACT NA SUSP: 2.0000 | Freq: Every day | NASAL | 0 refills | Status: DC

## 2022-12-13 MED ORDER — BENZONATATE 100 MG PO CAPS: 100.0000 mg | ORAL_CAPSULE | Freq: Three times a day (TID) | ORAL | 0 refills | Status: DC | PRN

## 2022-12-13 NOTE — Progress Notes (Signed)
I have spent 5 minutes in review of e-visit questionnaire, review and updating patient chart, medical decision making and response to patient.   Renly Roots Cody Yoshi Vicencio, PA-C    

## 2022-12-13 NOTE — Progress Notes (Signed)

## 2023-01-09 ENCOUNTER — Ambulatory Visit: Payer: BC Managed Care – PPO | Admitting: Dermatology

## 2023-01-09 VITALS — BP 153/74

## 2023-01-09 DIAGNOSIS — L578 Other skin changes due to chronic exposure to nonionizing radiation: Secondary | ICD-10-CM | POA: Diagnosis not present

## 2023-01-09 DIAGNOSIS — Z1283 Encounter for screening for malignant neoplasm of skin: Secondary | ICD-10-CM

## 2023-01-09 DIAGNOSIS — L570 Actinic keratosis: Secondary | ICD-10-CM

## 2023-01-09 DIAGNOSIS — Z86018 Personal history of other benign neoplasm: Secondary | ICD-10-CM

## 2023-01-09 DIAGNOSIS — L814 Other melanin hyperpigmentation: Secondary | ICD-10-CM | POA: Diagnosis not present

## 2023-01-09 DIAGNOSIS — L821 Other seborrheic keratosis: Secondary | ICD-10-CM

## 2023-01-09 NOTE — Progress Notes (Signed)
Follow-Up Visit   Subjective  Joseph May is a 62 y.o. male who presents for the following: Skin Cancer Screening and Full Body Skin Exam - History of dysplastic nevi and AK  The patient presents for Total-Body Skin Exam (TBSE) for skin cancer screening and mole check. The patient has spots, moles and lesions to be evaluated, some may be new or changing and the patient has concerns that these could be cancer.    The following portions of the chart were reviewed this encounter and updated as appropriate: medications, allergies, medical history  Review of Systems:  No other skin or systemic complaints except as noted in HPI or Assessment and Plan.  Objective  Well appearing patient in no apparent distress; mood and affect are within normal limits.  A full examination was performed including scalp, head, eyes, ears, nose, lips, neck, chest, axillae, abdomen, back, buttocks, bilateral upper extremities, bilateral lower extremities, hands, feet, fingers, toes, fingernails, and toenails. All findings within normal limits unless otherwise noted below.   Relevant physical exam findings are noted in the Assessment and Plan.    Assessment & Plan   History of Dysplastic Nevi - No evidence of recurrence today - Recommend regular full body skin exams - Recommend daily broad spectrum sunscreen SPF 30+ to sun-exposed areas, reapply every 2 hours as needed.  - Call if any new or changing lesions are noted between office visits   LENTIGINES, SEBORRHEIC KERATOSES, HEMANGIOMAS - Benign normal skin lesions - Benign-appearing - Call for any changes  MELANOCYTIC NEVI - Tan-brown and/or pink-flesh-colored symmetric macules and papules - Benign appearing on exam today - Observation - Call clinic for new or changing moles - Recommend daily use of broad spectrum spf 30+ sunscreen to sun-exposed areas.   ACTINIC DAMAGE - Chronic condition, secondary to cumulative UV/sun exposure - diffuse  scaly erythematous macules with underlying dyspigmentation - Recommend daily broad spectrum sunscreen SPF 30+ to sun-exposed areas, reapply every 2 hours as needed.  - Staying in the shade or wearing long sleeves, sun glasses (UVA+UVB protection) and wide brim hats (4-inch brim around the entire circumference of the hat) are also recommended for sun protection.  - Call for new or changing lesions.  SKIN CANCER SCREENING PERFORMED TODAY.  ACTINIC KERATOSIS Exam: Erythematous thin papules/macules with gritty scale  Actinic keratoses are precancerous spots that appear secondary to cumulative UV radiation exposure/sun exposure over time. They are chronic with expected duration over 1 year. A portion of actinic keratoses will progress to squamous cell carcinoma of the skin. It is not possible to reliably predict which spots will progress to skin cancer and so treatment is recommended to prevent development of skin cancer.  Recommend daily broad spectrum sunscreen SPF 30+ to sun-exposed areas, reapply every 2 hours as needed.  Recommend staying in the shade or wearing long sleeves, sun glasses (UVA+UVB protection) and wide brim hats (4-inch brim around the entire circumference of the hat). Call for new or changing lesions.  Treatment Plan:  Prior to procedure, discussed risks of blister formation, small wound, skin dyspigmentation, or rare scar following cryotherapy. Recommend Vaseline ointment to treated areas while healing.  Destruction Procedure Note Destruction method: cryotherapy   Informed consent: discussed and consent obtained   Lesion destroyed using liquid nitrogen: Yes   Outcome: patient tolerated procedure well with no complications   Post-procedure details: wound care instructions given   Locations: right cheek # of Lesions Treated: 1  He may retreat scalp with Fluorouracil  5%/Calcipotriene cream bid x 7 days - he has medication at home.  Return in about 1 year (around  01/09/2024) for TBSE.  I, Joanie Coddington, CMA, am acting as scribe for Armida Sans, MD .   Documentation: I have reviewed the above documentation for accuracy and completeness, and I agree with the above.  Armida Sans, MD

## 2023-01-09 NOTE — Patient Instructions (Signed)
Cryotherapy Aftercare  Wash gently with soap and water everyday.   Apply Vaseline and Band-Aid daily until healed.     Due to recent changes in healthcare laws, you may see results of your pathology and/or laboratory studies on MyChart before the doctors have had a chance to review them. We understand that in some cases there may be results that are confusing or concerning to you. Please understand that not all results are received at the same time and often the doctors may need to interpret multiple results in order to provide you with the best plan of care or course of treatment. Therefore, we ask that you please give us 2 business days to thoroughly review all your results before contacting the office for clarification. Should we see a critical lab result, you will be contacted sooner.   If You Need Anything After Your Visit  If you have any questions or concerns for your doctor, please call our main line at [phone] and press option 4 to reach your doctor's medical assistant. If no one answers, please leave a voicemail as directed and we will return your call as soon as possible. Messages left after 4 pm will be answered the following business day.   You may also send us a message via MyChart. We typically respond to MyChart messages within 1-2 business days.  For prescription refills, please ask your pharmacy to contact our office. Our fax number is [phone].  If you have an urgent issue when the clinic is closed that cannot wait until the next business day, you can page your doctor at the number below.    Please note that while we do our best to be available for urgent issues outside of office hours, we are not available 24/7.   If you have an urgent issue and are unable to reach us, you may choose to seek medical care at your doctor's office, retail clinic, urgent care center, or emergency room.  If you have a medical emergency, please immediately call 911 or go to the  emergency department.  Pager Numbers  - Dr. Kowalski: [phone]  - Dr. Moye: [phone]  - Dr. Stewart: [phone]  In the event of inclement weather, please call our main line at [phone] for an update on the status of any delays or closures.  Dermatology Medication Tips: Please keep the boxes that topical medications come in in order to help keep track of the instructions about where and how to use these. Pharmacies typically print the medication instructions only on the boxes and not directly on the medication tubes.   If your medication is too expensive, please contact our office at [phone] option 4 or send us a message through MyChart.   We are unable to tell what your co-pay for medications will be in advance as this is different depending on your insurance coverage. However, we may be able to find a substitute medication at lower cost or fill out paperwork to get insurance to cover a needed medication.   If a prior authorization is required to get your medication covered by your insurance company, please allow us 1-2 business days to complete this process.  Drug prices often vary depending on where the prescription is filled and some pharmacies may offer cheaper prices.  The website www.goodrx.com contains coupons for medications through different pharmacies. The prices here do not account for what the cost may be with help from insurance (it may be cheaper with your insurance), but the website can   give you the price if you did not use any insurance.  - You can print the associated coupon and take it with your prescription to the pharmacy.  - You may also stop by our office during regular business hours and pick up a GoodRx coupon card.  - If you need your prescription sent electronically to a different pharmacy, notify our office through [facility] MyChart or by phone at [phone] option 4.     Si Usted Necesita Algo Despus de Su Visita  Tambin puede  enviarnos un mensaje a travs de MyChart. Por lo general respondemos a los mensajes de MyChart en el transcurso de 1 a 2 das hbiles.  Para renovar recetas, por favor pida a su farmacia que se ponga en contacto con nuestra oficina. Nuestro nmero de fax es el [phone].  Si tiene un asunto urgente cuando la clnica est cerrada y que no puede esperar hasta el siguiente da hbil, puede llamar/localizar a su doctor(a) al nmero que aparece a continuacin.   Por favor, tenga en cuenta que aunque hacemos todo lo posible para estar disponibles para asuntos urgentes fuera del horario de oficina, no estamos disponibles las 24 horas del da, los 7 das de la semana.   Si tiene un problema urgente y no puede comunicarse con nosotros, puede optar por buscar atencin mdica  en el consultorio de su doctor(a), en una clnica privada, en un centro de atencin urgente o en una sala de emergencias.  Si tiene una emergencia mdica, por favor llame inmediatamente al 911 o vaya a la sala de emergencias.  Nmeros de bper  - Dr. Kowalski: [phone]  - Dra. Moye: [phone]  - Dra. Stewart: [phone]  En caso de inclemencias del tiempo, por favor llame a nuestra lnea principal al [phone] para una actualizacin sobre el estado de cualquier retraso o cierre.  Consejos para la medicacin en dermatologa: Por favor, guarde las cajas en las que vienen los medicamentos de uso tpico para ayudarle a seguir las instrucciones sobre dnde y cmo usarlos. Las farmacias generalmente imprimen las instrucciones del medicamento slo en las cajas y no directamente en los tubos del medicamento.   Si su medicamento es muy caro, por favor, pngase en contacto con nuestra oficina llamando al [phone] y presione la opcin 4 o envenos un mensaje a travs de MyChart.   No podemos decirle cul ser su copago por los medicamentos por adelantado ya que esto es diferente dependiendo de la cobertura de su seguro.  Sin embargo, es posible que podamos encontrar un medicamento sustituto a menor costo o llenar un formulario para que el seguro cubra el medicamento que se considera necesario.   Si se requiere una autorizacin previa para que su compaa de seguros cubra su medicamento, por favor permtanos de 1 a 2 das hbiles para completar este proceso.  Los precios de los medicamentos varan con frecuencia dependiendo del lugar de dnde se surte la receta y alguna farmacias pueden ofrecer precios ms baratos.  El sitio web www.goodrx.com tiene cupones para medicamentos de diferentes farmacias. Los precios aqu no tienen en cuenta lo que podra costar con la ayuda del seguro (puede ser ms barato con su seguro), pero el sitio web puede darle el precio si no utiliz ningn seguro.  - Puede imprimir el cupn correspondiente y llevarlo con su receta a la farmacia.  - Tambin puede pasar por nuestra oficina durante el horario de atencin regular y recoger una tarjeta de cupones de GoodRx.  -   Si necesita que su receta se enve electrnicamente a una farmacia diferente, informe a nuestra oficina a travs de MyChart de [facility] o por telfono llamando al [phone] y presione la opcin 4.  

## 2023-01-21 ENCOUNTER — Encounter: Payer: Self-pay | Admitting: Dermatology

## 2023-01-30 ENCOUNTER — Telehealth: Payer: BC Managed Care – PPO | Admitting: Nurse Practitioner

## 2023-01-30 DIAGNOSIS — J40 Bronchitis, not specified as acute or chronic: Secondary | ICD-10-CM

## 2023-01-30 DIAGNOSIS — J0110 Acute frontal sinusitis, unspecified: Secondary | ICD-10-CM

## 2023-01-30 MED ORDER — DOXYCYCLINE HYCLATE 100 MG PO TABS
100.0000 mg | ORAL_TABLET | Freq: Two times a day (BID) | ORAL | 0 refills | Status: AC
Start: 2023-01-30 — End: 2023-02-09

## 2023-01-30 NOTE — Progress Notes (Signed)
E-Visit for Cough  We are sorry that you are not feeling well.  Here is how we plan to help!  Based on your presentation I believe you most likely have A cough due to bacteria.  When patients have a fever and a productive cough with a change in color or increased sputum production, we are concerned about bacterial bronchitis.  If left untreated it can progress to pneumonia.  If your symptoms do not improve with your treatment plan it is important that you contact your provider.   I have prescribed Doxycycline 100 mg twice a day for 10 days, this will cover your cough as well as any lingering sinus symptoms you may have     In addition you may use A non-prescription cough medication called Mucinex DM: take 2 tablets every 12 hours. and A prescription cough medication called Tessalon Perles 100mg . You may take 1-2 capsules every 8 hours as needed for your cough.    From your responses in the eVisit questionnaire you describe inflammation in the upper respiratory tract which is causing a significant cough.  This is commonly called Bronchitis and has four common causes:   Allergies Viral Infections Acid Reflux Bacterial Infection Allergies, viruses and acid reflux are treated by controlling symptoms or eliminating the cause. An example might be a cough caused by taking certain blood pressure medications. You stop the cough by changing the medication. Another example might be a cough caused by acid reflux. Controlling the reflux helps control the cough.  USE OF BRONCHODILATOR ("RESCUE") INHALERS: There is a risk from using your bronchodilator too frequently.  The risk is that over-reliance on a medication which only relaxes the muscles surrounding the breathing tubes can reduce the effectiveness of medications prescribed to reduce swelling and congestion of the tubes themselves.  Although you feel brief relief from the bronchodilator inhaler, your asthma may actually be worsening with the tubes becoming  more swollen and filled with mucus.  This can delay other crucial treatments, such as oral steroid medications. If you need to use a bronchodilator inhaler daily, several times per day, you should discuss this with your provider.  There are probably better treatments that could be used to keep your asthma under control.     HOME CARE Only take medications as instructed by your medical team. Complete the entire course of an antibiotic. Drink plenty of fluids and get plenty of rest. Avoid close contacts especially the very young and the elderly Cover your mouth if you cough or cough into your sleeve. Always remember to wash your hands A steam or ultrasonic humidifier can help congestion.   GET HELP RIGHT AWAY IF: You develop worsening fever. You become short of breath You cough up blood. Your symptoms persist after you have completed your treatment plan MAKE SURE YOU  Understand these instructions. Will watch your condition. Will get help right away if you are not doing well or get worse.    Thank you for choosing an e-visit.  Your e-visit answers were reviewed by a board certified advanced clinical practitioner to complete your personal care plan. Depending upon the condition, your plan could have included both over the counter or prescription medications.  Please review your pharmacy choice. Make sure the pharmacy is open so you can pick up prescription now. If there is a problem, you may contact your provider through [employer] and have the prescription routed to another pharmacy.  Your safety is important to [place]. If you have drug  allergies check your prescription carefully.   For the next 24 hours you can use MyChart to ask questions about today's visit, request a non-urgent call back, or ask for a work or school excuse. You will get an email in the next two days asking about your experience. I hope that your e-visit has been valuable and will speed your recovery.   Meds  ordered this encounter  Medications   doxycycline (VIBRA-TABS) 100 MG tablet    Sig: Take 1 tablet (100 mg total) by mouth 2 (two) times daily for 10 days.    Dispense:  20 tablet    Refill:  0    I spent approximately 5 minutes reviewing the patient's history, current symptoms and coordinating their care today.

## 2023-05-28 ENCOUNTER — Inpatient Hospital Stay: Payer: BC Managed Care – PPO | Attending: Internal Medicine

## 2023-05-28 DIAGNOSIS — C649 Malignant neoplasm of unspecified kidney, except renal pelvis: Secondary | ICD-10-CM

## 2023-05-28 DIAGNOSIS — Z905 Acquired absence of kidney: Secondary | ICD-10-CM | POA: Diagnosis not present

## 2023-05-28 DIAGNOSIS — Z85528 Personal history of other malignant neoplasm of kidney: Secondary | ICD-10-CM | POA: Diagnosis not present

## 2023-05-28 LAB — CMP (CANCER CENTER ONLY)
ALT: 18 U/L (ref 0–44)
AST: 18 U/L (ref 15–41)
Albumin: 4.3 g/dL (ref 3.5–5.0)
Alkaline Phosphatase: 68 U/L (ref 38–126)
Anion gap: 8 (ref 5–15)
BUN: 17 mg/dL (ref 6–20)
CO2: 25 mmol/L (ref 22–32)
Calcium: 8.6 mg/dL — ABNORMAL LOW (ref 8.9–10.3)
Chloride: 102 mmol/L (ref 98–111)
Creatinine: 1.2 mg/dL (ref 0.61–1.24)
GFR, Estimated: >60 mL/min (ref >60)
Glucose, Bld: 110 mg/dL — ABNORMAL HIGH (ref 70–99)
Potassium: 3.6 mmol/L (ref 3.5–5.1)
Sodium: 135 mmol/L (ref 135–145)
Total Bilirubin: 0.5 mg/dL (ref 0.3–1.2)
Total Protein: 6.7 g/dL (ref 6.5–8.1)

## 2023-05-28 LAB — CBC WITH DIFFERENTIAL (CANCER CENTER ONLY)
Abs Immature Granulocytes: 0.02 10*3/uL (ref 0.00–0.07)
Basophils Absolute: 0 10*3/uL (ref 0.0–0.1)
Basophils Relative: 1 %
Eosinophils Absolute: 0.1 10*3/uL (ref 0.0–0.5)
Eosinophils Relative: 3 %
HCT: 40.5 % (ref 39.0–52.0)
Hemoglobin: 13.4 g/dL (ref 13.0–17.0)
Immature Granulocytes: 1 %
Lymphocytes Relative: 23 %
Lymphs Abs: 1 10*3/uL (ref 0.7–4.0)
MCH: 30.1 pg (ref 26.0–34.0)
MCHC: 33.1 g/dL (ref 30.0–36.0)
MCV: 91 fL (ref 80.0–100.0)
Monocytes Absolute: 0.5 10*3/uL (ref 0.1–1.0)
Monocytes Relative: 12 %
Neutro Abs: 2.7 10*3/uL (ref 1.7–7.7)
Neutrophils Relative %: 60 %
Platelet Count: 163 10*3/uL (ref 150–400)
RBC: 4.45 MIL/uL (ref 4.22–5.81)
RDW: 12.7 % (ref 11.5–15.5)
WBC Count: 4.4 10*3/uL (ref 4.0–10.5)
nRBC: 0 % (ref 0.0–0.2)

## 2023-05-29 ENCOUNTER — Ambulatory Visit
Admission: RE | Admit: 2023-05-29 | Discharge: 2023-05-29 | Disposition: A | Discharge status: Home / Self Care | Payer: BC Managed Care – PPO | Source: Ambulatory Visit | Attending: Oncology | Admitting: Oncology

## 2023-05-29 DIAGNOSIS — K5730 Diverticulosis of large intestine without perforation or abscess without bleeding: Secondary | ICD-10-CM | POA: Diagnosis not present

## 2023-05-29 DIAGNOSIS — C649 Malignant neoplasm of unspecified kidney, except renal pelvis: Secondary | ICD-10-CM | POA: Insufficient documentation

## 2023-05-29 DIAGNOSIS — I700 Atherosclerosis of aorta: Secondary | ICD-10-CM | POA: Diagnosis not present

## 2023-05-29 DIAGNOSIS — C642 Malignant neoplasm of left kidney, except renal pelvis: Secondary | ICD-10-CM | POA: Diagnosis not present

## 2023-05-29 DIAGNOSIS — Z905 Acquired absence of kidney: Secondary | ICD-10-CM | POA: Diagnosis not present

## 2023-05-29 MED ORDER — IOHEXOL 300 MG/ML  SOLN
100.0000 mL | Freq: Once | INTRAMUSCULAR | Status: AC | PRN
  Administered 2023-05-29: 100 mL via INTRAVENOUS

## 2023-06-03 ENCOUNTER — Other Ambulatory Visit: Payer: Self-pay | Admitting: *Deleted

## 2023-06-03 DIAGNOSIS — C649 Malignant neoplasm of unspecified kidney, except renal pelvis: Secondary | ICD-10-CM

## 2023-06-04 ENCOUNTER — Inpatient Hospital Stay: Payer: BC Managed Care – PPO

## 2023-06-04 ENCOUNTER — Inpatient Hospital Stay: Payer: BC Managed Care – PPO | Admitting: Internal Medicine

## 2023-06-04 VITALS — BP 149/75 | HR 72 | Temp 98.9°F | Ht 70.0 in | Wt 203.2 lb

## 2023-06-04 DIAGNOSIS — Z85528 Personal history of other malignant neoplasm of kidney: Secondary | ICD-10-CM | POA: Diagnosis not present

## 2023-06-04 DIAGNOSIS — C642 Malignant neoplasm of left kidney, except renal pelvis: Secondary | ICD-10-CM | POA: Diagnosis not present

## 2023-06-04 DIAGNOSIS — Z905 Acquired absence of kidney: Secondary | ICD-10-CM | POA: Diagnosis not present

## 2023-06-04 NOTE — Progress Notes (Signed)
 No questions/concerns today.

## 2023-06-04 NOTE — Assessment & Plan Note (Addendum)
#   Kidney cancer diagnosed in 2016.  He was found to have T3N0 clear-cell tumor-no adjuvant therapy.   # I again reviewed the the natural course of this disease and risk of relapse was discussed at this time.  He continues to have no evidence of relapse based on imaging studies obtained in November 2023.  Awaiting imaging results from 1 week ago.   # Will continue surveillance with imaging again in 10 months; continue systemic therapy and evidence of relapse.  # screening: Long discussion regarding importance of colonoscopy.  Patient finally relented. referral to GI- Dr.Anna- re: screening colonoscopy.   CT CAP- pt re: will call as authorization 52m only.  # DISPOSITION: # referral to GI- Dr.Anna- re: screening colonoscopy.  # Follow up 10 months- MD; labs- cbc/cmp; -Dr.B

## 2023-06-04 NOTE — Progress Notes (Signed)
[facility] CONSULT NOTE  Patient Care Team: [name], NP as PCP - General (Internal Medicine) Earna Coder, MD as Consulting Physician (Oncology)  CHIEF COMPLAINTS/PURPOSE OF CONSULTATION: RCC  Oncology History  Cancer of left kidney [facility])  01/17/2017 Initial Diagnosis   Renal cell cancer (HCC)   02/10/2021 Cancer Staging   Staging form: Kidney, AJCC 8th Edition - Clinical: Stage III (cT3, cN0, cM0) - Signed by Benjiman Core, MD on 02/10/2021      HISTORY OF PRESENTING ILLNESS: Patient ambulating-independently. Accompanied by wife.   Joseph May 62 y.o.  male pleasant patient with a kidney cancer diagnosed in 2016. He was found to haveT3N0 clear-cell- post laparoscopic left radical nephrectomy done on 06/30/2015. The pathology revealed clear cell histology nuclear grade 4/ measuring 3.6 cm.  He declined adjuvant therapy at that time.  Patient denies any shortness of breath or cough or chest pain.  No bone pain.  No nausea no vomiting.   Review of Systems  Constitutional:  Negative for chills, diaphoresis, fever, malaise/fatigue and weight loss.  HENT:  Negative for nosebleeds and sore throat.   Eyes:  Negative for double vision.  Respiratory:  Negative for cough, hemoptysis, sputum production, shortness of breath and wheezing.   Cardiovascular:  Negative for chest pain, palpitations, orthopnea and leg swelling.  Gastrointestinal:  Negative for abdominal pain, blood in stool, constipation, diarrhea, heartburn, melena, nausea and vomiting.  Genitourinary:  Negative for dysuria, frequency and urgency.  Musculoskeletal:  Negative for back pain and joint pain.  Skin: Negative.  Negative for itching and rash.  Neurological:  Negative for dizziness, tingling, focal weakness, weakness and headaches.  Endo/Heme/Allergies:  Does not bruise/bleed easily.  Psychiatric/Behavioral:  Negative for depression. The patient is not nervous/anxious and does not  have insomnia.     MEDICAL HISTORY:  Past Medical History:  Diagnosis Date   Arthritis    Dysplastic nevus 05/02/2016   R post auricular - moderate   Dysplastic nevus 03/03/2018   R infrapectoral med epigastric - mod to severe - excision 07/15/2018   Hemorrhoids    Liver cyst    JUST WATCHING   Renal cell cancer (HCC)    Left RCC 06/30/2015    SURGICAL HISTORY: Past Surgical History:  Procedure Laterality Date   ANTERIOR CERVICAL DECOMP/DISCECTOMY FUSION N/A 01/31/2017   Procedure: ANTERIOR CERVICAL DECOMPRESSION/DISCECTOMY FUSION CERVICAL FOUR - CERVICAL FIVE , CERVICAL SIX- CERVICAL SEVEN;  Surgeon: Coletta Memos, MD;  Location: [facility] OR;  Service: Neurosurgery;  Laterality: N/A;   CYSTOSCOPY WITH STENT PLACEMENT Left 06/07/2015   Procedure: CYSTOSCOPY WITH STENT PLACEMENT;  Surgeon: Crist Fat, MD;  Location: [facility] ORS;  Service: Urology;  Laterality: Left;   LAPAROSCOPIC NEPHRECTOMY Left 06/30/2015   Procedure: LAPAROSCOPIC LEFT  NEPHRECTOMY;  Surgeon: Crist Fat, MD;  Location: [facility] ORS;  Service: Urology;  Laterality: Left;   NECK SURGERY      SOCIAL HISTORY: Social History   Socioeconomic History   Marital status: Married    Spouse name: Not on file   Number of children: Not on file   Years of education: Not on file   Highest education level: Not on file  Occupational History   Not on file  Tobacco Use   Smoking status: Never   Smokeless tobacco: Never  Substance and Sexual Activity   Alcohol use: Yes    Alcohol/week: 0.0 standard drinks of alcohol    Comment: 2-3 beers at night   Drug  use: No   Sexual activity: Not on file  Other Topics Concern   Not on file  Social History Narrative   Married.  Lives in a one story home.   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     Enjoys visiting family at the beach.    Social Determinants of Health   Financial Resource Strain: Not on file  Food Insecurity: Not on file  Transportation Needs: Not on  file  Physical Activity: Not on file  Stress: Not on file  Social Connections: Not on file  Intimate Partner Violence: Not on file    FAMILY HISTORY: Family History  Problem Relation Age of Onset   Diabetes Mother    Breast cancer Mother    Liver disease Father    Alcohol abuse Father        Deceased 24   Healthy Brother    Healthy Daughter     ALLERGIES:  is allergic to no known allergies.  MEDICATIONS:  Current Outpatient Medications  Medication Sig Dispense Refill   acetaminophen (TYLENOL) 500 MG tablet Take 2 tablets (1,000 mg total) by mouth every 6 (six) hours as needed for mild pain or moderate pain. 30 tablet 0   albuterol (PROVENTIL HFA;VENTOLIN HFA) 108 (90 Base) MCG/ACT inhaler Inhale 2 puffs into the lungs every 4 (four) hours as needed for wheezing or shortness of breath. (Patient not taking: Reported on 06/04/2023) 1 Inhaler 0   benzonatate (TESSALON) 100 MG capsule Take 1 capsule (100 mg total) by mouth 3 (three) times daily as needed for cough. (Patient not taking: Reported on 06/04/2023) 30 capsule 0   FLOVENT HFA 110 MCG/ACT inhaler TAKE 1 PUFF BY MOUTH TWICE A DAY (Patient not taking: Reported on 06/04/2023) 12 Inhaler 0   fluticasone (FLONASE) 50 MCG/ACT nasal spray Place 2 sprays into both nostrils daily. (Patient not taking: Reported on 06/04/2023) 16 g 0   mupirocin ointment (BACTROBAN) 2 % Apply 1 application topically daily. With dressing changes (Patient not taking: Reported on 06/04/2023) 22 g 0   No current facility-administered medications for this visit.    PHYSICAL EXAMINATION:   Vitals:   06/04/23 1508  BP: (!) 149/75  Pulse: 72  Temp: 98.9 F (37.2 C)  SpO2: 100%   Filed Weights   06/04/23 1508  Weight: 203 lb 3.2 oz (92.2 kg)    Physical Exam Vitals and nursing note reviewed.  HENT:     Head: Normocephalic and atraumatic.     Mouth/Throat:     Pharynx: Oropharynx is clear.  Eyes:     Extraocular Movements: Extraocular movements  intact.     Pupils: Pupils are equal, round, and reactive to light.  Cardiovascular:     Rate and Rhythm: Normal rate and regular rhythm.  Pulmonary:     Comments: Decreased breath sounds bilaterally.  Abdominal:     Palpations: Abdomen is soft.  Musculoskeletal:        General: Normal range of motion.     Cervical back: Normal range of motion.  Skin:    General: Skin is warm.  Neurological:     General: No focal deficit present.     Mental Status: He is alert and oriented to person, place, and time.  Psychiatric:        Behavior: Behavior normal.        Judgment: Judgment normal.     LABORATORY DATA:  I have reviewed the data as listed Lab Results  Component Value  Date   WBC 4.4 05/28/2023   HGB 13.4 05/28/2023   HCT 40.5 05/28/2023   MCV 91.0 05/28/2023   PLT 163 05/28/2023   Recent Labs    07/23/22 0845 05/28/23 0809  NA 140 135  K 4.1 3.6  CL 104 102  CO2 28 25  GLUCOSE 110* 110*  BUN 15 17  CREATININE 1.24 1.20  CALCIUM 9.0 8.6*  GFRNONAA >60 >60  PROT 7.0 6.7  ALBUMIN 4.1 4.3  AST 17 18  ALT 16 18  ALKPHOS 69 68  BILITOT 0.7 0.5    RADIOGRAPHIC STUDIES: I have personally reviewed the radiological images as listed and agreed with the findings in the report. CT Chest W Contrast  Result Date: 06/04/2023 CLINICAL DATA:  Kidney cancer recurrence. Surveillance. History of left renal cell carcinoma with left nephrectomy in 2016. * Tracking Code: BO * EXAM: CT CHEST WITH CONTRAST CT ABDOMEN AND PELVIS WITH AND WITHOUT CONTRAST TECHNIQUE: Multidetector CT imaging of the chest was performed during intravenous contrast administration. Multidetector CT imaging of the abdomen and pelvis was performed following the standard protocol before and during bolus administration of intravenous contrast. RADIATION DOSE REDUCTION: This exam was performed according to the departmental dose-optimization program which includes automated exposure control, adjustment of the mA  and/or kV according to patient size and/or use of iterative reconstruction technique. CONTRAST:  OMNIPAQUE IOHEXOL 300 MG/ML  SOLN COMPARISON:  Prior CTs 07/25/2022 and 10/11/2021. FINDINGS: CT CHEST FINDINGS Cardiovascular: No significant vascular findings. The heart size is normal. There is no pericardial effusion. Mediastinum/Nodes: There are no enlarged mediastinal, hilar or axillary lymph nodes. The thyroid gland, trachea and esophagus demonstrate no significant findings. Lungs/Pleura: No pleural effusion or pneumothorax. No suspicious pulmonary nodules. Stable chronic 4 mm subpleural nodule in the left lower lobe on image 117/10, consistent with a lymph node. Musculoskeletal/Chest wall: No chest wall mass or suspicious osseous findings. Previous lower cervical fusion. Mild multilevel thoracic spondylosis. CT ABDOMEN AND PELVIS FINDINGS Hepatobiliary: The liver is normal in density without suspicious focal abnormality. No evidence of gallstones, gallbladder wall thickening or biliary dilatation. Pancreas: Unremarkable. No pancreatic ductal dilatation or surrounding inflammatory changes. Spleen: Normal in size without focal abnormality. Adrenals/Urinary Tract: Both adrenal glands appear normal. Status post left nephrectomy. No evidence of tumor recurrence in the nephrectomy bed. The right kidney appears normal. Specifically, no evidence of right renal mass, urinary tract calculus or hydronephrosis. The bladder appears normal for its degree of distention. Stomach/Bowel: No enteric contrast administered. The stomach appears unremarkable for its degree of distension. No evidence of bowel wall thickening, distention or surrounding inflammatory change. The appendix appears normal. Stable diverticular changes within the distal colon. Vascular/Lymphatic: There are no enlarged abdominal or pelvic lymph nodes. Small inguinal lymph nodes are unchanged. No acute vascular findings. Mild aortic atherosclerosis.  Duplication of the infrarenal IVC again noted. Reproductive: The prostate gland and seminal vesicles appear unremarkable. Other: No evidence of abdominal wall mass or hernia. No ascites or pneumoperitoneum. Musculoskeletal: No acute or significant osseous findings. Stable mild spondylosis. Unless specific follow-up recommendations are mentioned in the findings or impression sections, no imaging follow-up of any mentioned incidental findings is recommended. IMPRESSION: 1. Stable CTS of the chest, abdomen and pelvis status post left nephrectomy. 2. No evidence of local recurrence or metastatic disease. 3. No acute findings. 4.  Aortic Atherosclerosis (ICD10-I70.0). 5. If subsequent surveillance is warranted clinically, routine post-contrast imaging recommended (no pre contrast imaging of the abdomen or pelvis necessary). Electronically  Signed   By: Carey Bullocks M.D.   On: 06/04/2023 15:50   CT Abdomen Pelvis W Wo Contrast  Result Date: 06/04/2023 CLINICAL DATA:  Kidney cancer recurrence. Surveillance. History of left renal cell carcinoma with left nephrectomy in 2016. * Tracking Code: BO * EXAM: CT CHEST WITH CONTRAST CT ABDOMEN AND PELVIS WITH AND WITHOUT CONTRAST TECHNIQUE: Multidetector CT imaging of the chest was performed during intravenous contrast administration. Multidetector CT imaging of the abdomen and pelvis was performed following the standard protocol before and during bolus administration of intravenous contrast. RADIATION DOSE REDUCTION: This exam was performed according to the departmental dose-optimization program which includes automated exposure control, adjustment of the mA and/or kV according to patient size and/or use of iterative reconstruction technique. CONTRAST:  OMNIPAQUE IOHEXOL 300 MG/ML  SOLN COMPARISON:  Prior CTs 07/25/2022 and 10/11/2021. FINDINGS: CT CHEST FINDINGS Cardiovascular: No significant vascular findings. The heart size is normal. There is no pericardial  effusion. Mediastinum/Nodes: There are no enlarged mediastinal, hilar or axillary lymph nodes. The thyroid gland, trachea and esophagus demonstrate no significant findings. Lungs/Pleura: No pleural effusion or pneumothorax. No suspicious pulmonary nodules. Stable chronic 4 mm subpleural nodule in the left lower lobe on image 117/10, consistent with a lymph node. Musculoskeletal/Chest wall: No chest wall mass or suspicious osseous findings. Previous lower cervical fusion. Mild multilevel thoracic spondylosis. CT ABDOMEN AND PELVIS FINDINGS Hepatobiliary: The liver is normal in density without suspicious focal abnormality. No evidence of gallstones, gallbladder wall thickening or biliary dilatation. Pancreas: Unremarkable. No pancreatic ductal dilatation or surrounding inflammatory changes. Spleen: Normal in size without focal abnormality. Adrenals/Urinary Tract: Both adrenal glands appear normal. Status post left nephrectomy. No evidence of tumor recurrence in the nephrectomy bed. The right kidney appears normal. Specifically, no evidence of right renal mass, urinary tract calculus or hydronephrosis. The bladder appears normal for its degree of distention. Stomach/Bowel: No enteric contrast administered. The stomach appears unremarkable for its degree of distension. No evidence of bowel wall thickening, distention or surrounding inflammatory change. The appendix appears normal. Stable diverticular changes within the distal colon. Vascular/Lymphatic: There are no enlarged abdominal or pelvic lymph nodes. Small inguinal lymph nodes are unchanged. No acute vascular findings. Mild aortic atherosclerosis. Duplication of the infrarenal IVC again noted. Reproductive: The prostate gland and seminal vesicles appear unremarkable. Other: No evidence of abdominal wall mass or hernia. No ascites or pneumoperitoneum. Musculoskeletal: No acute or significant osseous findings. Stable mild spondylosis. Unless specific follow-up  recommendations are mentioned in the findings or impression sections, no imaging follow-up of any mentioned incidental findings is recommended. IMPRESSION: 1. Stable CTS of the chest, abdomen and pelvis status post left nephrectomy. 2. No evidence of local recurrence or metastatic disease. 3. No acute findings. 4.  Aortic Atherosclerosis (ICD10-I70.0). 5. If subsequent surveillance is warranted clinically, routine post-contrast imaging recommended (no pre contrast imaging of the abdomen or pelvis necessary). Electronically Signed   By: Carey Bullocks M.D.   On: 06/04/2023 15:50     Cancer of left kidney [facility])  # Kidney cancer diagnosed in 2016.  He was found to have T3N0 clear-cell tumor-no adjuvant therapy.   # I again reviewed the the natural course of this disease and risk of relapse was discussed at this time.  He continues to have no evidence of relapse based on imaging studies obtained in November 2023.  Awaiting imaging results from 1 week ago.   # Will continue surveillance with imaging again in 10 months; continue systemic therapy  and evidence of relapse.  # screening: Long discussion regarding importance of colonoscopy.  Patient finally relented. referral to GI- Dr.Anna- re: screening colonoscopy.   CT CAP- pt re: will call as authorization 3m only.  # DISPOSITION: # referral to GI- Dr.Anna- re: screening colonoscopy.  # Follow up 10 months- MD; labs- cbc/cmp; -Dr.B     Earna Coder, MD 06/04/2023 4:03 PM

## 2023-06-18 ENCOUNTER — Encounter: Payer: Self-pay | Admitting: *Deleted

## 2023-06-26 ENCOUNTER — Telehealth: Payer: Self-pay

## 2023-06-26 ENCOUNTER — Telehealth: Payer: Self-pay | Admitting: *Deleted

## 2023-06-26 ENCOUNTER — Other Ambulatory Visit: Payer: Self-pay | Admitting: *Deleted

## 2023-06-26 DIAGNOSIS — Z1211 Encounter for screening for malignant neoplasm of colon: Secondary | ICD-10-CM

## 2023-06-26 MED ORDER — NA SULFATE-K SULFATE-MG SULF 17.5-3.13-1.6 GM/177ML PO SOLN: 1.0000 | Freq: Once | ORAL | 0 refills | Status: AC

## 2023-06-26 NOTE — Telephone Encounter (Signed)
Colonoscopy schedule on 08/09/2023 with Dr Tobi Bastos

## 2023-06-26 NOTE — Telephone Encounter (Signed)
Patient called back to schedule his colonoscopy.

## 2023-06-26 NOTE — Telephone Encounter (Signed)
Gastroenterology Pre-Procedure Review  Request Date: 08/09/2023 Requesting Physician: Dr. Tobi Bastos  PATIENT REVIEW QUESTIONS: The patient responded to the following health history questions as indicated:    1. Are you having any GI issues? no 2. Do you have a personal history of Polyps? no 3. Do you have a family history of Colon Cancer or Polyps? no 4. Diabetes Mellitus? no 5. Joint replacements in the past 12 months?no 6. Major health problems in the past 3 months?no 7. Any artificial heart valves, MVP, or defibrillator?no    MEDICATIONS & ALLERGIES:    Patient reports the following regarding taking any anticoagulation/antiplatelet therapy:   Plavix, Coumadin, Eliquis, Xarelto, Lovenox, Pradaxa, Brilinta, or Effient? no Aspirin? no  Patient confirms/reports the following medications:  Current Outpatient Medications  Medication Sig Dispense Refill   acetaminophen (TYLENOL) 500 MG tablet Take 2 tablets (1,000 mg total) by mouth every 6 (six) hours as needed for mild pain or moderate pain. 30 tablet 0   albuterol (PROVENTIL HFA;VENTOLIN HFA) 108 (90 Base) MCG/ACT inhaler Inhale 2 puffs into the lungs every 4 (four) hours as needed for wheezing or shortness of breath. (Patient not taking: Reported on 06/04/2023) 1 Inhaler 0   benzonatate (TESSALON) 100 MG capsule Take 1 capsule (100 mg total) by mouth 3 (three) times daily as needed for cough. (Patient not taking: Reported on 06/04/2023) 30 capsule 0   FLOVENT HFA 110 MCG/ACT inhaler TAKE 1 PUFF BY MOUTH TWICE A DAY (Patient not taking: Reported on 06/04/2023) 12 Inhaler 0   fluticasone (FLONASE) 50 MCG/ACT nasal spray Place 2 sprays into both nostrils daily. (Patient not taking: Reported on 06/04/2023) 16 g 0   mupirocin ointment (BACTROBAN) 2 % Apply 1 application topically daily. With dressing changes (Patient not taking: Reported on 06/04/2023) 22 g 0   No current facility-administered medications for this visit.    Patient confirms/reports  the following allergies:  Allergies  Allergen Reactions   No Known Allergies     No orders of the defined types were placed in this encounter.   AUTHORIZATION INFORMATION Primary Insurance: 1D#: Group #:  Secondary Insurance: 1D#: Group #:  SCHEDULE INFORMATION: Date: 08/09/2023 Time: Location: [facility]

## 2023-08-05 ENCOUNTER — Encounter: Payer: Self-pay | Admitting: Gastroenterology

## 2023-08-07 ENCOUNTER — Telehealth: Payer: Self-pay

## 2023-08-07 NOTE — Telephone Encounter (Signed)
okay

## 2023-08-07 NOTE — Telephone Encounter (Signed)
Message left for patient to return my call.  Patient is already schedule on 08/09/2023 according, this should be a reschedule if he calls again.

## 2023-08-07 NOTE — Telephone Encounter (Signed)
Pt requesting call back to schedule colonoscopy.

## 2023-08-07 NOTE — Telephone Encounter (Signed)
Patient called back, he had questions regarding his upcoming colonoscopy on Friday, 08/09/2023.  Discuss with patient.

## 2023-08-09 ENCOUNTER — Encounter: Payer: Self-pay | Admitting: Gastroenterology

## 2023-08-09 ENCOUNTER — Other Ambulatory Visit: Payer: Self-pay

## 2023-08-09 ENCOUNTER — Ambulatory Visit: Payer: BC Managed Care – PPO | Admitting: Certified Registered"

## 2023-08-09 ENCOUNTER — Ambulatory Visit
Admission: RE | Admit: 2023-08-09 | Discharge: 2023-08-09 | Disposition: A | Discharge status: Home / Self Care | Payer: BC Managed Care – PPO | Attending: Gastroenterology | Admitting: Gastroenterology

## 2023-08-09 ENCOUNTER — Encounter
Admission: RE | Disposition: A | Discharge status: Home / Self Care | Payer: Self-pay | Source: Home / Self Care | Attending: Gastroenterology

## 2023-08-09 DIAGNOSIS — K635 Polyp of colon: Secondary | ICD-10-CM | POA: Diagnosis not present

## 2023-08-09 DIAGNOSIS — D126 Benign neoplasm of colon, unspecified: Secondary | ICD-10-CM

## 2023-08-09 DIAGNOSIS — K5730 Diverticulosis of large intestine without perforation or abscess without bleeding: Secondary | ICD-10-CM | POA: Diagnosis not present

## 2023-08-09 DIAGNOSIS — Z1211 Encounter for screening for malignant neoplasm of colon: Secondary | ICD-10-CM | POA: Insufficient documentation

## 2023-08-09 DIAGNOSIS — D128 Benign neoplasm of rectum: Secondary | ICD-10-CM | POA: Insufficient documentation

## 2023-08-09 DIAGNOSIS — D123 Benign neoplasm of transverse colon: Secondary | ICD-10-CM | POA: Insufficient documentation

## 2023-08-09 HISTORY — PX: POLYPECTOMY: SHX5525

## 2023-08-09 HISTORY — PX: COLONOSCOPY WITH PROPOFOL: SHX5780

## 2023-08-09 SURGERY — COLONOSCOPY WITH PROPOFOL
Anesthesia: General

## 2023-08-09 MED ORDER — PROPOFOL 10 MG/ML IV BOLUS
INTRAVENOUS | Status: DC | PRN
  Administered 2023-08-09: 20 mg via INTRAVENOUS
  Administered 2023-08-09: 50 mg via INTRAVENOUS
  Administered 2023-08-09: 20 mg via INTRAVENOUS
  Administered 2023-08-09: 30 mg via INTRAVENOUS
  Administered 2023-08-09 (×4): 20 mg via INTRAVENOUS

## 2023-08-09 MED ORDER — LIDOCAINE HCL (PF) 2 % IJ SOLN
INTRAMUSCULAR | Status: AC
  Filled 2023-08-09: qty 5

## 2023-08-09 MED ORDER — SODIUM CHLORIDE 0.9 % IV SOLN: INTRAVENOUS | Status: DC

## 2023-08-09 MED ORDER — PROPOFOL 1000 MG/100ML IV EMUL
INTRAVENOUS | Status: AC
  Filled 2023-08-09: qty 100

## 2023-08-09 MED ORDER — LIDOCAINE HCL (PF) 2 % IJ SOLN
INTRAMUSCULAR | Status: DC | PRN
  Administered 2023-08-09: 40 mg via INTRADERMAL

## 2023-08-09 NOTE — Op Note (Addendum)
[facility] Gastroenterology Patient Name: Joseph May Procedure Date: 08/09/2023 8:14 AM MRN: [id number] Account #: [id number] Date of Birth: Apr 28, 1961 Admit Type: Outpatient Age: 62 Room: [facility] ENDO ROOM 1 Gender: Male Note Status: Finalized Instrument Name: Prentice Docker [id number] Procedure:             Colonoscopy Indications:           Screening for colorectal malignant neoplasm Providers:             [name] MD, MD Referring MD:          [name] (Referring MD) Medicines:             Monitored Anesthesia Care Complications:         No immediate complications. Procedure:             Pre-Anesthesia Assessment:                        - Prior to the procedure, a History and Physical was                         performed, and patient medications, allergies and                         sensitivities were reviewed. The patient's tolerance                         of previous anesthesia was reviewed.                        - The risks and benefits of the procedure and the                         sedation options and risks were discussed with the                         patient. All questions were answered and informed                         consent was obtained.                        - ASA Grade Assessment: II - A patient with mild                         systemic disease.                        After obtaining informed consent, the colonoscope was                         passed under direct vision. Throughout the procedure,                         the patient's blood pressure, pulse, and oxygen                         saturations were monitored continuously. The                         Colonoscope was introduced  through the anus and                         advanced to the the cecum, identified by the                         appendiceal orifice. The colonoscopy was performed                         with ease. The patient tolerated the procedure well.                          The quality of the bowel preparation was adequate to                         identify polyps greater than 5 mm in size. Findings:      The perianal and digital rectal examinations were normal.      Two sessile polyps were found in the rectum and transverse colon. The       polyps were 4 to 5 mm in size. These polyps were removed with a cold       snare. Resection and retrieval were complete.      The exam was otherwise without abnormality on direct and retroflexion       views. Impression:            - Two 4 to 5 mm polyps in the rectum, in the                         descending colon and in the transverse colon, removed                         with a cold snare. Resected and retrieved.                        - The examination was otherwise normal on direct and                         retroflexion views. Recommendation:        - Discharge patient to home (with escort).                        - Resume previous diet.                        - Continue present medications.                        - Await pathology results.                        - Repeat colonoscopy in 3 - 5 years for surveillance. Procedure Code(s):     --- Professional ---                        [phone], Colonoscopy, flexible; with removal of                         tumor(s), polyp(s), or other lesion(s) by snare  technique Diagnosis Code(s):     --- Professional ---                        Z12.11, Encounter for screening for malignant neoplasm                         of colon                        D12.8, Benign neoplasm of rectum                        D12.4, Benign neoplasm of descending colon CPT copyright 2022 American Medical Association. All rights reserved. The codes documented in this report are preliminary and upon coder review may  be revised to meet current compliance requirements. [name], MD [name] MD, MD 08/09/2023 8:45:31 AM This report has been signed  electronically. Number of Addenda: 0 Note Initiated On: 08/09/2023 8:14 AM Scope Withdrawal Time: 0 hours 8 minutes 38 seconds  Total Procedure Duration: 0 hours 10 minutes 51 seconds  Estimated Blood Loss:  Estimated blood loss: none. Estimated blood loss: none.      [facility]

## 2023-08-09 NOTE — H&P (Signed)
[name], MD [address], [place], [place], [address] [address], [place], [place], [address] Phone: [phone]  Fax: [phone]  Primary Care Physician:  [name], NP   Pre-Procedure History & Physical: HPI:  Joseph May is a 62 y.o. male is here for an colonoscopy.   Past Medical History:  Diagnosis Date   Arthritis    Dysplastic nevus 05/02/2016   R post auricular - moderate   Dysplastic nevus 03/03/2018   R infrapectoral med epigastric - mod to severe - excision 07/15/2018   Hemorrhoids    Liver cyst    JUST WATCHING   Renal cell cancer (HCC)    Left RCC 06/30/2015    Past Surgical History:  Procedure Laterality Date   ANTERIOR CERVICAL DECOMP/DISCECTOMY FUSION N/A 01/31/2017   Procedure: ANTERIOR CERVICAL DECOMPRESSION/DISCECTOMY FUSION CERVICAL FOUR - CERVICAL FIVE , CERVICAL SIX- CERVICAL SEVEN;  Surgeon: Coletta Memos, MD;  Location: [facility];  Service: Neurosurgery;  Laterality: N/A;   CYSTOSCOPY WITH STENT PLACEMENT Left 06/07/2015   Procedure: CYSTOSCOPY WITH STENT PLACEMENT;  Surgeon: Crist Fat, MD;  Location: [facility] ORS;  Service: Urology;  Laterality: Left;   LAPAROSCOPIC NEPHRECTOMY Left 06/30/2015   Procedure: LAPAROSCOPIC LEFT  NEPHRECTOMY;  Surgeon: Crist Fat, MD;  Location: [facility];  Service: Urology;  Laterality: Left;   NECK SURGERY      Prior to Admission medications   Medication Sig Start Date End Date Taking? Authorizing Provider  acetaminophen (TYLENOL) 500 MG tablet Take 2 tablets (1,000 mg total) by mouth every 6 (six) hours as needed for mild pain or moderate pain. 07/01/15   Crist Fat, MD  albuterol (PROVENTIL HFA;VENTOLIN HFA) 108 (90 Base) MCG/ACT inhaler Inhale 2 puffs into the lungs every 4 (four) hours as needed for wheezing or shortness of breath. Patient not taking: Reported on 06/04/2023 06/19/18   [name], NP  benzonatate (TESSALON) 100 MG capsule Take 1 capsule  (100 mg total) by mouth 3 (three) times daily as needed for cough. Patient not taking: Reported on 06/04/2023 12/13/22   [name]  FLOVENT HFA 110 MCG/ACT inhaler TAKE 1 PUFF BY MOUTH TWICE A DAY Patient not taking: Reported on 06/04/2023 08/20/18   [name], NP  fluticasone [facility]) 50 MCG/ACT nasal spray Place 2 sprays into both nostrils daily. Patient not taking: Reported on 06/04/2023 12/13/22   [name], PA-C  mupirocin ointment (BACTROBAN) 2 % Apply 1 application topically daily. With dressing changes Patient not taking: Reported on 06/04/2023 06/20/21   [name], MD    Allergies as of 06/27/2023 - Review Complete 06/04/2023  Allergen Reaction Noted   No known allergies  01/30/2017    Family History  Problem Relation Age of Onset   Diabetes Mother    Breast cancer Mother    Liver disease Father    Alcohol abuse Father        Deceased 35   Healthy Brother    Healthy Daughter     Social History   Socioeconomic History   Marital status: Married    Spouse name: Not on file   Number of children: Not on file   Years of education: Not on file   Highest education level: Not on file  Occupational History   Not on file  Tobacco Use   Smoking status: Never   Smokeless tobacco: Never  Vaping Use   Vaping status: Never Used  Substance and Sexual Activity   Alcohol use: Yes    Alcohol/week: 0.0 standard drinks of alcohol    Comment: 2-3 beers at night   Drug use: No   Sexual activity: Not on file  Other Topics Concern   Not on file  Social History Narrative   Married.  Lives in a one story home.   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     Enjoys visiting family at the beach.    Social Determinants of Health   Financial Resource Strain: Not on file  Food Insecurity: Not on file  Transportation Needs: Not on file  Physical Activity: Not on file  Stress: Not on file  Social Connections: Not on file  Intimate Partner  Violence: Not on file    Review of Systems: See HPI, otherwise negative ROS  Physical Exam: BP (!) 140/83   Pulse 61   Temp (!) 96.7 F (35.9 C) (Temporal)   Resp 16   Ht 6\' 1"  (1.854 m)   Wt 89.3 kg   SpO2 99%   BMI 25.96 kg/m  General:   Alert,  pleasant and cooperative in NAD Head:  Normocephalic and atraumatic. Neck:  Supple; no masses or thyromegaly. Lungs:  Clear throughout to auscultation, normal respiratory effort.    Heart:  +S1, +S2, Regular rate and rhythm, No edema. Abdomen:  Soft, nontender and nondistended. Normal bowel sounds, without guarding, and without rebound.   Neurologic:  Alert and  oriented x4;  grossly normal neurologically.  Impression/Plan: Joseph May is here for an colonoscopy to be performed for Screening colonoscopy average risk   Risks, benefits, limitations, and alternatives regarding  colonoscopy have been reviewed with the patient.  Questions have been answered.  All parties agreeable.   [name], MD  08/09/2023, 7:38 AM

## 2023-08-09 NOTE — Transfer of Care (Signed)
Immediate Anesthesia Transfer of Care Note  Patient: Joseph May  Procedure(s) Performed: COLONOSCOPY WITH PROPOFOL  Patient Location: PACU and Endoscopy Unit  Anesthesia Type:MAC  Level of Consciousness: awake and oriented  Airway & Oxygen Therapy: Patient Spontanous Breathing and Patient connected to nasal cannula oxygen  Post-op Assessment: Report given to RN and Post -op Vital signs reviewed and stable  Post vital signs: Reviewed and stable  Last Vitals:  Vitals Value Taken Time  BP 125/93 08/09/23 0844  Temp    Pulse 75 08/09/23 0844  Resp 12 08/09/23 0844  SpO2 100 % 08/09/23 0844  Vitals shown include unfiled device data.  Last Pain:  Vitals:   08/09/23 0720  TempSrc: Temporal  PainSc: 0-No pain         Complications: No notable events documented.

## 2023-08-09 NOTE — Anesthesia Postprocedure Evaluation (Signed)
Anesthesia Post Note  Patient: Joseph May  Procedure(s) Performed: COLONOSCOPY WITH PROPOFOL POLYPECTOMY  Patient location during evaluation: PACU Anesthesia Type: General Level of consciousness: awake Pain management: pain level controlled Vital Signs Assessment: post-procedure vital signs reviewed and stable Respiratory status: nonlabored ventilation Cardiovascular status: blood pressure returned to baseline Anesthetic complications: no   No notable events documented.   Last Vitals:  Vitals:   08/09/23 0843 08/09/23 0854  BP: (!) 125/93 (!) 137/90  Pulse: 75 71  Resp: 14 20  Temp: (!) 36.1 C   SpO2: 100% 100%    Last Pain:  Vitals:   08/09/23 0854  TempSrc:   PainSc: 0-No pain                 VAN STAVEREN,Nekeya Briski

## 2023-08-09 NOTE — Anesthesia Preprocedure Evaluation (Signed)
Anesthesia Evaluation  Patient identified by MRN, date of birth, ID band Patient awake    Reviewed: Allergy & Precautions, NPO status , Patient's Chart, lab work & pertinent test results  Airway Mallampati: II  TM Distance: >3 FB Neck ROM: full    Dental  (+) Teeth Intact   Pulmonary neg pulmonary ROS   Pulmonary exam normal breath sounds clear to auscultation       Cardiovascular Exercise Tolerance: Good negative cardio ROS Normal cardiovascular exam Rhythm:Regular Rate:Normal     Neuro/Psych negative neurological ROS  negative psych ROS   GI/Hepatic negative GI ROS, Neg liver ROS,,,  Endo/Other  negative endocrine ROS    Renal/GU negative Renal ROSS/p L nephrectomy  negative genitourinary   Musculoskeletal   Abdominal Normal abdominal exam  (+)   Peds negative pediatric ROS (+)  Hematology negative hematology ROS (+)   Anesthesia Other Findings Past Medical History: No date: Arthritis 05/02/2016: Dysplastic nevus     Comment:  R post auricular - moderate 03/03/2018: Dysplastic nevus     Comment:  R infrapectoral med epigastric - mod to severe -               excision 07/15/2018 No date: Hemorrhoids No date: Liver cyst     Comment:  JUST WATCHING No date: Renal cell cancer (HCC)     Comment:  Left RCC 06/30/2015  Past Surgical History: 01/31/2017: ANTERIOR CERVICAL DECOMP/DISCECTOMY FUSION; N/A     Comment:  Procedure: ANTERIOR CERVICAL DECOMPRESSION/DISCECTOMY               FUSION CERVICAL FOUR - CERVICAL FIVE , CERVICAL SIX-               CERVICAL SEVEN;  Surgeon: Coletta Memos, MD;  [facility];  Service: Neurosurgery;  Laterality: N/A; 06/07/2015: CYSTOSCOPY WITH STENT PLACEMENT; Left     Comment:  Procedure: CYSTOSCOPY WITH STENT PLACEMENT;  Surgeon:               Crist Fat, MD;  Location: [facility] ORS;  Service:               Urology;  Laterality: Left; 06/30/2015:  LAPAROSCOPIC NEPHRECTOMY; Left     Comment:  Procedure: LAPAROSCOPIC LEFT  NEPHRECTOMY;  Surgeon:               Crist Fat, MD;  Location: [facility];  Service:               Urology;  Laterality: Left; No date: NECK SURGERY  BMI    Body Mass Index: 25.96 kg/m      Reproductive/Obstetrics negative OB ROS                             Anesthesia Physical Anesthesia Plan  ASA: 2  Anesthesia Plan: General   Post-op Pain Management:    Induction: Intravenous  PONV Risk Score and Plan: Propofol infusion and TIVA  Airway Management Planned: Natural Airway and Nasal Cannula  Additional Equipment:   Intra-op Plan:   Post-operative Plan:   Informed Consent: I have reviewed the patients History and Physical, chart, labs and discussed the procedure including the risks, benefits and alternatives for the proposed anesthesia with the patient or authorized representative who has indicated his/her understanding and acceptance.     Dental Advisory Given  Plan Discussed  with: CRNA and Surgeon  Anesthesia Plan Comments:        Anesthesia Quick Evaluation

## 2023-08-12 ENCOUNTER — Encounter: Payer: Self-pay | Admitting: Gastroenterology

## 2023-08-12 LAB — SURGICAL PATHOLOGY

## 2023-08-15 ENCOUNTER — Encounter: Payer: Self-pay | Admitting: Gastroenterology

## 2024-01-08 ENCOUNTER — Ambulatory Visit: Payer: BC Managed Care – PPO | Admitting: Dermatology

## 2024-03-17 ENCOUNTER — Ambulatory Visit: Admitting: Dermatology

## 2024-04-01 ENCOUNTER — Other Ambulatory Visit: Payer: Self-pay | Admitting: Internal Medicine

## 2024-04-01 ENCOUNTER — Telehealth: Payer: Self-pay | Admitting: *Deleted

## 2024-04-01 DIAGNOSIS — C642 Malignant neoplasm of left kidney, except renal pelvis: Secondary | ICD-10-CM

## 2024-04-01 NOTE — Progress Notes (Signed)
 Please have the CT scan scheduled- and re-schedule the pt's appts appx 1 week post CT scans are done.   GB

## 2024-04-01 NOTE — Telephone Encounter (Signed)
 Pt called and has an appt this Friday (lab/MD) pt stated that for 9 years he has done CT scans before his MD appts and is confused as to why he has not had a CT and is just having lab/MD this Friday.   I told him he could bring this up to Dr.B when he sees him Friday but he wanted a msg sent as he feels if there was no CT scan then there would be nothing to talk about at his appt.  Please advise and call pt back. Phone number on chart is correct. Pt stated that sometimes he doesn't hear his phone when he is in the shop.

## 2024-04-01 NOTE — Telephone Encounter (Signed)
 The patient called about his appointments.  I called him back and he did not answer I did tell him that on July 11 at 3 PM he will be getting labs and 3:15 will be having a visit with Dr. Rennie.  If that was not what the answer he needed to ask then he can give me a call back so we can get it all straight.

## 2024-04-03 ENCOUNTER — Inpatient Hospital Stay: Payer: BC Managed Care – PPO | Admitting: Internal Medicine

## 2024-04-03 ENCOUNTER — Inpatient Hospital Stay: Payer: BC Managed Care – PPO

## 2024-04-08 ENCOUNTER — Ambulatory Visit
Admission: RE | Admit: 2024-04-08 | Discharge: 2024-04-08 | Disposition: A | Discharge status: Home / Self Care | Source: Ambulatory Visit | Attending: Internal Medicine | Admitting: Internal Medicine

## 2024-04-08 DIAGNOSIS — C642 Malignant neoplasm of left kidney, except renal pelvis: Secondary | ICD-10-CM

## 2024-04-08 DIAGNOSIS — R911 Solitary pulmonary nodule: Secondary | ICD-10-CM | POA: Diagnosis not present

## 2024-04-08 DIAGNOSIS — K5730 Diverticulosis of large intestine without perforation or abscess without bleeding: Secondary | ICD-10-CM | POA: Diagnosis not present

## 2024-04-08 DIAGNOSIS — Z905 Acquired absence of kidney: Secondary | ICD-10-CM | POA: Diagnosis not present

## 2024-04-08 MED ORDER — IOPAMIDOL (ISOVUE-300) INJECTION 61%
100.0000 mL | Freq: Once | INTRAVENOUS | Status: AC | PRN
  Administered 2024-04-08: 100 mL via INTRAVENOUS

## 2024-04-24 ENCOUNTER — Other Ambulatory Visit

## 2024-04-24 ENCOUNTER — Ambulatory Visit: Admitting: Internal Medicine

## 2024-04-28 ENCOUNTER — Other Ambulatory Visit

## 2024-04-28 ENCOUNTER — Ambulatory Visit: Admitting: Internal Medicine

## 2024-04-28 ENCOUNTER — Ambulatory Visit: Admitting: Dermatology

## 2024-04-29 ENCOUNTER — Encounter: Payer: Self-pay | Admitting: Internal Medicine

## 2024-04-29 ENCOUNTER — Inpatient Hospital Stay: Attending: Internal Medicine

## 2024-04-29 ENCOUNTER — Inpatient Hospital Stay (HOSPITAL_BASED_OUTPATIENT_CLINIC_OR_DEPARTMENT_OTHER): Admitting: Internal Medicine

## 2024-04-29 VITALS — BP 159/73 | HR 67 | Temp 98.4°F | Resp 16 | Ht 73.0 in | Wt 203.0 lb

## 2024-04-29 DIAGNOSIS — Z905 Acquired absence of kidney: Secondary | ICD-10-CM | POA: Diagnosis not present

## 2024-04-29 DIAGNOSIS — C642 Malignant neoplasm of left kidney, except renal pelvis: Secondary | ICD-10-CM | POA: Diagnosis not present

## 2024-04-29 DIAGNOSIS — Z85528 Personal history of other malignant neoplasm of kidney: Secondary | ICD-10-CM | POA: Insufficient documentation

## 2024-04-29 DIAGNOSIS — Z09 Encounter for follow-up examination after completed treatment for conditions other than malignant neoplasm: Secondary | ICD-10-CM | POA: Insufficient documentation

## 2024-04-29 LAB — CBC WITH DIFFERENTIAL (CANCER CENTER ONLY)
Abs Immature Granulocytes: 0.01 K/uL (ref 0.00–0.07)
Basophils Absolute: 0 K/uL (ref 0.0–0.1)
Basophils Relative: 1 %
Eosinophils Absolute: 0.1 K/uL (ref 0.0–0.5)
Eosinophils Relative: 2 %
HCT: 39.7 % (ref 39.0–52.0)
Hemoglobin: 13.5 g/dL (ref 13.0–17.0)
Immature Granulocytes: 0 %
Lymphocytes Relative: 20 %
Lymphs Abs: 1.1 K/uL (ref 0.7–4.0)
MCH: 30.8 pg (ref 26.0–34.0)
MCHC: 34 g/dL (ref 30.0–36.0)
MCV: 90.6 fL (ref 80.0–100.0)
Monocytes Absolute: 0.6 K/uL (ref 0.1–1.0)
Monocytes Relative: 11 %
Neutro Abs: 3.8 K/uL (ref 1.7–7.7)
Neutrophils Relative %: 66 %
Platelet Count: 174 K/uL (ref 150–400)
RBC: 4.38 MIL/uL (ref 4.22–5.81)
RDW: 12.5 % (ref 11.5–15.5)
WBC Count: 5.7 K/uL (ref 4.0–10.5)
nRBC: 0 % (ref 0.0–0.2)

## 2024-04-29 LAB — CMP (CANCER CENTER ONLY)
ALT: 19 U/L (ref 0–44)
AST: 22 U/L (ref 15–41)
Albumin: 4.1 g/dL (ref 3.5–5.0)
Alkaline Phosphatase: 77 U/L (ref 38–126)
Anion gap: 7 (ref 5–15)
BUN: 22 mg/dL — ABNORMAL HIGH (ref 6–20)
CO2: 26 mmol/L (ref 22–32)
Calcium: 9.1 mg/dL (ref 8.9–10.3)
Chloride: 101 mmol/L (ref 98–111)
Creatinine: 1.24 mg/dL (ref 0.61–1.24)
GFR, Estimated: >60 mL/min (ref >60)
Glucose, Bld: 103 mg/dL — ABNORMAL HIGH (ref 70–99)
Potassium: 4.4 mmol/L (ref 3.5–5.1)
Sodium: 134 mmol/L — ABNORMAL LOW (ref 135–145)
Total Bilirubin: 0.7 mg/dL (ref 0.0–1.2)
Total Protein: 6.8 g/dL (ref 6.5–8.1)

## 2024-04-29 NOTE — Progress Notes (Signed)
 Joseph May  Patient Care Team: Gretta Comer POUR, NP as PCP - General (Internal Medicine) Rennie Cindy SAUNDERS, MD as Consulting Physician (Oncology)  CHIEF COMPLAINTS/PURPOSE OF CONSULTATION: RCC  Oncology History  Cancer of left kidney [facility])  01/17/2017 Initial Diagnosis   Renal cell cancer (HCC)   02/10/2021 Cancer Staging   Staging form: Kidney, AJCC 8th Edition - Clinical: Stage III (cT3, cN0, cM0) - Signed by Amadeo Windell SAILOR, MD on 02/10/2021      HISTORY OF PRESENTING ILLNESS: Patient ambulating-independently. Accompanied by wife.   Joseph May 63 y.o.  male pleasant patient with a kidney cancer diagnosed in 2016  with T3N0 clear-cell- post laparoscopic left radical nephrectomy done on 06/30/2015-currently on surveillance is here to review the results of the CT scan.  Pt drinks a lot of water. Appetite is good. Bowels normal. No flank pain. No blood in urine   Patient denies any shortness of breath or cough or chest pain.  No bone pain.  No nausea no vomiting.   Review of Systems  Constitutional:  Negative for chills, diaphoresis, fever, malaise/fatigue and weight loss.  HENT:  Negative for nosebleeds and sore throat.   Eyes:  Negative for double vision.  Respiratory:  Negative for cough, hemoptysis, sputum production, shortness of breath and wheezing.   Cardiovascular:  Negative for chest pain, palpitations, orthopnea and leg swelling.  Gastrointestinal:  Negative for abdominal pain, blood in stool, constipation, diarrhea, heartburn, melena, nausea and vomiting.  Genitourinary:  Negative for dysuria, frequency and urgency.  Musculoskeletal:  Negative for back pain and joint pain.  Skin: Negative.  Negative for itching and rash.  Neurological:  Negative for dizziness, tingling, focal weakness, weakness and headaches.  Endo/Heme/Allergies:  Does not bruise/bleed easily.  Psychiatric/Behavioral:  Negative for depression. The patient is not  nervous/anxious and does not have insomnia.     MEDICAL HISTORY:  Past Medical History:  Diagnosis Date   Arthritis    Dysplastic nevus 05/02/2016   R post auricular - moderate   Dysplastic nevus 03/03/2018   R infrapectoral med epigastric - mod to severe - excision 07/15/2018   Hemorrhoids    Liver cyst    JUST WATCHING   Renal cell cancer (HCC)    Left RCC 06/30/2015    SURGICAL HISTORY: Past Surgical History:  Procedure Laterality Date   ANTERIOR CERVICAL DECOMP/DISCECTOMY FUSION N/A 01/31/2017   Procedure: ANTERIOR CERVICAL DECOMPRESSION/DISCECTOMY FUSION CERVICAL FOUR - CERVICAL FIVE , CERVICAL SIX- CERVICAL SEVEN;  Surgeon: Gillie Duncans, MD;  Location: [facility] OR;  Service: Neurosurgery;  Laterality: N/A;   COLONOSCOPY WITH PROPOFOL  N/A 08/09/2023   Procedure: COLONOSCOPY WITH PROPOFOL ;  Surgeon: Therisa Bi, MD;  Location: [facility] ENDOSCOPY;  Service: Gastroenterology;  Laterality: N/A;  1st case, please   CYSTOSCOPY WITH STENT PLACEMENT Left 06/07/2015   Procedure: CYSTOSCOPY WITH STENT PLACEMENT;  Surgeon: Morene LELON Salines, MD;  Location: [facility] ORS;  Service: Urology;  Laterality: Left;   LAPAROSCOPIC NEPHRECTOMY Left 06/30/2015   Procedure: LAPAROSCOPIC LEFT  NEPHRECTOMY;  Surgeon: Morene LELON Salines, MD;  Location: [facility];  Service: Urology;  Laterality: Left;   NECK SURGERY     POLYPECTOMY  08/09/2023   Procedure: POLYPECTOMY;  Surgeon: Therisa Bi, MD;  Location: [facility] ENDOSCOPY;  Service: Gastroenterology;;    SOCIAL HISTORY: Social History   Socioeconomic History   Marital status: Married    Spouse name: Not on file   Number of children: Not on file   Years of  education: Not on file   Highest education level: Not on file  Occupational History   Not on file  Tobacco Use   Smoking status: Never   Smokeless tobacco: Never  Vaping Use   Vaping status: Never Used  Substance and Sexual Activity   Alcohol use: Yes    Alcohol/week: 0.0 standard drinks of alcohol     Comment: 2-3 beers at night   Drug use: No   Sexual activity: Not on file  Other Topics Concern   Not on file  Social History Narrative   Married.  Lives in a one story home.   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     Enjoys visiting family at the beach.    Social Drivers of Corporate investment banker Strain: Not on file  Food Insecurity: Not on file  Transportation Needs: Not on file  Physical Activity: Not on file  Stress: Not on file  Social Connections: Not on file  Intimate Partner Violence: Not on file    FAMILY HISTORY: Family History  Problem Relation Age of Onset   Diabetes Mother    Breast cancer Mother    Liver disease Father    Alcohol abuse Father        Deceased 26   Healthy Brother    Healthy Daughter     ALLERGIES:  is allergic to no known allergies.  MEDICATIONS:  Current Outpatient Medications  Medication Sig Dispense Refill   acetaminophen  (TYLENOL ) 500 MG tablet Take 2 tablets (1,000 mg total) by mouth every 6 (six) hours as needed for mild pain or moderate pain. 30 tablet 0   No current facility-administered medications for this visit.    PHYSICAL EXAMINATION:   Vitals:   04/29/24 1518  BP: (!) 159/73  Pulse: 67  Resp: 16  Temp: 98.4 F (36.9 C)  SpO2: 100%   Filed Weights   04/29/24 1518  Weight: 203 lb (92.1 kg)    Physical Exam Vitals and nursing May reviewed.  HENT:     Head: Normocephalic and atraumatic.     Mouth/Throat:     Pharynx: Oropharynx is clear.  Eyes:     Extraocular Movements: Extraocular movements intact.     Pupils: Pupils are equal, round, and reactive to light.  Cardiovascular:     Rate and Rhythm: Normal rate and regular rhythm.  Pulmonary:     Comments: Decreased breath sounds bilaterally.  Abdominal:     Palpations: Abdomen is soft.  Musculoskeletal:        General: Normal range of motion.     Cervical back: Normal range of motion.  Skin:    General: Skin is warm.  Neurological:      General: No focal deficit present.     Mental Status: He is alert and oriented to person, place, and time.  Psychiatric:        Behavior: Behavior normal.        Judgment: Judgment normal.     LABORATORY DATA:  I have reviewed the data as listed Lab Results  Component Value Date   WBC 5.7 04/29/2024   HGB 13.5 04/29/2024   HCT 39.7 04/29/2024   MCV 90.6 04/29/2024   PLT 174 04/29/2024   Recent Labs    05/28/23 0809 04/29/24 1456  NA 135 134*  K 3.6 4.4  CL 102 101  CO2 25 26  GLUCOSE 110* 103*  BUN 17 22*  CREATININE 1.20 1.24  CALCIUM 8.6* 9.1  GFRNONAA >60 >60  PROT 6.7 6.8  ALBUMIN 4.3 4.1  AST 18 22  ALT 18 19  ALKPHOS 68 77  BILITOT 0.5 0.7    RADIOGRAPHIC STUDIES: I have personally reviewed the radiological images as listed and agreed with the findings in the report. CT CHEST ABDOMEN PELVIS W CONTRAST Result Date: 04/12/2024 CLINICAL DATA:  History of renal cell carcinoma, follow-up. * Tracking Code: BO *. EXAM: CT CHEST, ABDOMEN, AND PELVIS WITH CONTRAST TECHNIQUE: Multidetector CT imaging of the chest, abdomen and pelvis was performed following the standard protocol during bolus administration of intravenous contrast. RADIATION DOSE REDUCTION: This exam was performed according to the departmental dose-optimization program which includes automated exposure control, adjustment of the mA and/or kV according to patient size and/or use of iterative reconstruction technique. CONTRAST:  ISOVUE -300 IOPAMIDOL  (ISOVUE -300) INJECTION 61% COMPARISON:  Multiple priors including CT May 29, 2023 FINDINGS: CT CHEST FINDINGS Cardiovascular: Normal caliber thoracic aorta. Normal size heart. No significant pericardial effusion/thickening. Mediastinum/Nodes: No suspicious thyroid nodule. No pathologically enlarged mediastinal, hilar or axillary lymph nodes. The esophagus is grossly unremarkable. Lungs/Pleura: Stable 4 mm subpleural left lower lobe pulmonary nodule on image  100/3. No new suspicious pulmonary nodules or masses. Scattered atelectasis/scarring. Musculoskeletal: No aggressive lytic or blastic lesion of bone. Multilevel degenerative change of the spine. Anterior cervical fusion hardware. CT ABDOMEN PELVIS FINDINGS Hepatobiliary: No suspicious hepatic lesion. Gallbladder is unremarkable. No biliary ductal dilation. Pancreas: No pancreatic ductal dilation or evidence of acute inflammation. Spleen: No splenomegaly. Adrenals/Urinary Tract: Bilateral adrenal glands appear normal. Left kidney surgically absent without new suspicious nodularity in the surgical bed. Right kidney is unremarkable without hydronephrosis or solid enhancing renal mass. Urinary bladder is unremarkable for degree of distension. Stomach/Bowel: Stomach is within normal limits. Appendix appears normal. No evidence of bowel wall thickening, distention, or inflammatory changes. Colonic diverticulosis. Vascular/Lymphatic: Duplication of the SVC, congenital variant. Normal caliber abdominal aorta. No pathologically enlarged abdominal or pelvic lymph nodes. Reproductive: Prostate is unremarkable. Other: No significant abdominopelvic free fluid. Musculoskeletal: No aggressive lytic or blastic lesion of bone. Multilevel degenerative changes spine. Degenerative changes bilateral hips. IMPRESSION: 1. Status post left nephrectomy without evidence of local recurrence or metastatic disease in the chest, abdomen or pelvis. 2. Stable 4 mm subpleural left lower lobe pulmonary nodule, favored benign. 3. Colonic diverticulosis without evidence of acute diverticulitis. Electronically Signed   By: Reyes Holder M.D.   On: 04/12/2024 10:32     Cancer of left kidney [facility])  # [Dr.Shadad] Kidney cancer diagnosed in 2016.T3N0 clear-cell tumor-  nuclear grade 4/ measuring 3.6 cm. Declined adjuvant therapy.   # CT scan: JULY 2025- Status post left nephrectomy without evidence of local recurrence or metastatic disease in the  chest, abdomen or pelvis; Stable 4 mm subpleural left lower lobe pulmonary nodule, favored benign.  # for now Will continue surveillance with imaging again in 12  months.   # screening: colonoscopy- nov 2025- next 3-5 years [Dr.Anna]. annual skin exams-   # DISPOSITION: # Follow up 12 months- MD; labs- cbc/cmp; pior-CT CAP- -Dr.B  # I reviewed the blood work- with the patient in detail; also reviewed the imaging independently [as summarized above]; and with the patient in detail.       Cindy JONELLE Joe, MD 04/29/2024 3:56 PM

## 2024-04-29 NOTE — Assessment & Plan Note (Addendum)
# [  Dr.Shadad] Kidney cancer diagnosed in 2016.T3N0 clear-cell tumor-  nuclear grade 4/ measuring 3.6 cm. Declined adjuvant therapy.   # CT scan: JULY 2025- Status post left nephrectomy without evidence of local recurrence or metastatic disease in the chest, abdomen or pelvis; Stable 4 mm subpleural left lower lobe pulmonary nodule, favored benign.  # for now Will continue surveillance with imaging again in 12  months.   # screening: colonoscopy- nov 2025- next 3-5 years [Dr.Anna]. annual skin exams-   # DISPOSITION: # Follow up 12 months- MD; labs- cbc/cmp; pior-CT CAP- -Dr.B  # I reviewed the blood work- with the patient in detail; also reviewed the imaging independently [as summarized above]; and with the patient in detail.

## 2024-04-29 NOTE — Progress Notes (Signed)
 Pt drinks a lot of water. Appetite is good. Bowels normal. No flank pain. No blood in urine.

## 2024-05-05 ENCOUNTER — Ambulatory Visit: Admitting: Dermatology

## 2024-05-05 ENCOUNTER — Encounter: Payer: Self-pay | Admitting: Dermatology

## 2024-05-05 DIAGNOSIS — Z79899 Other long term (current) drug therapy: Secondary | ICD-10-CM

## 2024-05-05 DIAGNOSIS — Z86018 Personal history of other benign neoplasm: Secondary | ICD-10-CM

## 2024-05-05 DIAGNOSIS — W908XXA Exposure to other nonionizing radiation, initial encounter: Secondary | ICD-10-CM

## 2024-05-05 DIAGNOSIS — Z5111 Encounter for antineoplastic chemotherapy: Secondary | ICD-10-CM

## 2024-05-05 DIAGNOSIS — L578 Other skin changes due to chronic exposure to nonionizing radiation: Secondary | ICD-10-CM

## 2024-05-05 DIAGNOSIS — L814 Other melanin hyperpigmentation: Secondary | ICD-10-CM | POA: Diagnosis not present

## 2024-05-05 DIAGNOSIS — Z7189 Other specified counseling: Secondary | ICD-10-CM

## 2024-05-05 DIAGNOSIS — L820 Inflamed seborrheic keratosis: Secondary | ICD-10-CM | POA: Diagnosis not present

## 2024-05-05 DIAGNOSIS — L570 Actinic keratosis: Secondary | ICD-10-CM

## 2024-05-05 DIAGNOSIS — Z1283 Encounter for screening for malignant neoplasm of skin: Secondary | ICD-10-CM | POA: Diagnosis not present

## 2024-05-05 NOTE — Progress Notes (Signed)
 Follow-Up Visit   Subjective  Joseph May is a 63 y.o. male who presents for the following: Skin Cancer Screening and Full Body Skin Exam  The patient presents for Total-Body Skin Exam (TBSE) for skin cancer screening and mole check. The patient has spots, moles and lesions to be evaluated, some may be new or changing and the patient may have concern these could be cancer.  The following portions of the chart were reviewed this encounter and updated as appropriate: medications, allergies, medical history  Review of Systems:  No other skin or systemic complaints except as noted in HPI or Assessment and Plan.  Objective  Well appearing patient in no apparent distress; mood and affect are within normal limits.  A full examination was performed including scalp, head, eyes, ears, nose, lips, neck, chest, axillae, abdomen, back, buttocks, bilateral upper extremities, bilateral lower extremities, hands, feet, fingers, toes, fingernails, and toenails. All findings within normal limits unless otherwise noted below.   Relevant physical exam findings are noted in the Assessment and Plan.  Scalp x 5, right ear x 1, neck x 1 (7) Erythematous thin papules/macules with gritty scale.  neck x 3, right xyphoid x 1 (4) Stuck-on, waxy, tan-brown papules and plaques -- Discussed benign etiology and prognosis.   Assessment & Plan   SKIN CANCER SCREENING PERFORMED TODAY.   LENTIGINES, SEBORRHEIC KERATOSES, HEMANGIOMAS - Benign normal skin lesions - Benign-appearing - Call for any changes  MELANOCYTIC NEVI - Tan-brown and/or pink-flesh-colored symmetric macules and papules - Benign appearing on exam today - Observation - Call clinic for new or changing moles - Recommend daily use of broad spectrum spf 30+ sunscreen to sun-exposed areas.   HISTORY OF DYSPLASTIC NEVUS No evidence of recurrence today Recommend regular full body skin exams Recommend daily broad spectrum sunscreen SPF 30+ to  sun-exposed areas, reapply every 2 hours as needed.  Call if any new or changing lesions are noted between office visits   AK (ACTINIC KERATOSIS) (7) Scalp x 5, right ear x 1, neck x 1 (7) ACTINIC DAMAGE WITH PRECANCEROUS ACTINIC KERATOSES Counseling for Topical Chemotherapy Management: Patient exhibits: - Severe, confluent actinic changes with pre-cancerous actinic keratoses that is secondary to cumulative UV radiation exposure over time - Condition that is severe; chronic, not at goal. - diffuse scaly erythematous macules and papules with underlying dyspigmentation - Discussed Prescription Field Treatment topical Chemotherapy for Severe, Chronic Confluent Actinic Changes with Pre-Cancerous Actinic Keratoses Field treatment involves treatment of an entire area of skin that has confluent Actinic Changes (Sun/ Ultraviolet light damage) and PreCancerous Actinic Keratoses by method of PhotoDynamic Therapy (PDT) and/or prescription Topical Chemotherapy agents such as 5-fluorouracil, 5-fluorouracil/calcipotriene, and/or imiquimod.  The purpose is to decrease the number of clinically evident and subclinical PreCancerous lesions to prevent progression to development of skin cancer by chemically destroying early precancer changes that may or may not be visible.  It has been shown to reduce the risk of developing skin cancer in the treated area. As a result of treatment, redness, scaling, crusting, and open sores may occur during treatment course. One or more than one of these methods may be used and may have to be used several times to control, suppress and eliminate the PreCancerous changes. Discussed treatment course, expected reaction, and possible side effects. - Recommend daily broad spectrum sunscreen SPF 30+ to sun-exposed areas, reapply every 2 hours as needed.  - Staying in the shade or wearing long sleeves, sun glasses (UVA+UVB protection) and wide brim  hats (4-inch brim around the entire  circumference of the hat) are also recommended. - Call for new or changing lesions.   In September begin 5FU/Calcipotriene cream apply to scalp twice a day for 10 days  Destruction of lesion - Scalp x 5, right ear x 1, neck x 1 (7) Complexity: simple   Destruction method: cryotherapy   Informed consent: discussed and consent obtained   Timeout:  patient name, date of birth, surgical site, and procedure verified Lesion destroyed using liquid nitrogen: Yes   Region frozen until ice ball extended beyond lesion: Yes   Outcome: patient tolerated procedure well with no complications   Post-procedure details: wound care instructions given    INFLAMED SEBORRHEIC KERATOSIS (4) neck x 3, right xyphoid x 1 (4) Symptomatic, irritating, patient would like treated.  Destruction of lesion - neck x 3, right xyphoid x 1 (4) Complexity: simple   Destruction method: cryotherapy   Informed consent: discussed and consent obtained   Timeout:  patient name, date of birth, surgical site, and procedure verified Lesion destroyed using liquid nitrogen: Yes   Region frozen until ice ball extended beyond lesion: Yes   Outcome: patient tolerated procedure well with no complications   Post-procedure details: wound care instructions given      Return in about 1 year (around 05/05/2025) for TBSE, hx of Dysplastic nevus .  IFay Kirks, CMA, am acting as scribe for Alm Rhyme, MD .   Documentation: I have reviewed the above documentation for accuracy and completeness, and I agree with the above.  Alm Rhyme, MD

## 2024-05-05 NOTE — Patient Instructions (Addendum)
 5-Fluorouracil/Calcipotriene Patient Education   In September begin 5FU/Calcipotriene cream apply to scalp twice a day for 10 days    Actinic keratoses are the dry, red scaly spots on the skin caused by sun damage. A portion of these spots can turn into skin cancer with time, and treating them can help prevent development of skin cancer.   Treatment of these spots requires removal of the defective skin cells. There are various ways to remove actinic keratoses, including freezing with liquid nitrogen, treatment with creams, or treatment with a blue light procedure in the office.   5-fluorouracil cream is a topical cream used to treat actinic keratoses. It works by interfering with the growth of abnormal fast-growing skin cells, such as actinic keratoses. These cells peel off and are replaced by healthy ones.   5-fluorouracil/calcipotriene is a combination of the 5-fluorouracil cream with a vitamin D analog cream called calcipotriene. The calcipotriene alone does not treat actinic keratoses. However, when it is combined with 5-fluorouracil, it helps the 5-fluorouracil treat the actinic keratoses much faster so that the same results can be achieved with a much shorter treatment time.  INSTRUCTIONS FOR 5-FLUOROURACIL/CALCIPOTRIENE CREAM:   5-fluorouracil/calcipotriene cream typically only needs to be used for 4-7 days. A thin layer should be applied twice a day to the treatment areas recommended by your physician.   If your physician prescribed you separate tubes of 5-fluourouracil and calcipotriene, apply a thin layer of 5-fluorouracil followed by a thin layer of calcipotriene.   Avoid contact with your eyes, nostrils, and mouth. Do not use 5-fluorouracil/calcipotriene cream on infected or open wounds.   You will develop redness, irritation and some crusting at areas where you have pre-cancer damage/actinic keratoses. IF YOU DEVELOP PAIN, BLEEDING, OR SIGNIFICANT CRUSTING, STOP THE TREATMENT EARLY  - you have already gotten a good response and the actinic keratoses should clear up well.  Wash your hands after applying 5-fluorouracil 5% cream on your skin.   A moisturizer or sunscreen with a minimum SPF 30 should be applied each morning.   Once you have finished the treatment, you can apply a thin layer of Vaseline twice a day to irritated areas to soothe and calm the areas more quickly. If you experience significant discomfort, contact your physician.  For some patients it is necessary to repeat the treatment for best results.  SIDE EFFECTS: When using 5-fluorouracil/calcipotriene cream, you may have mild irritation, such as redness, dryness, swelling, or a mild burning sensation. This usually resolves within 2 weeks. The more actinic keratoses you have, the more redness and inflammation you can expect during treatment. Eye irritation has been reported rarely. If this occurs, please let us  know.  If you have any trouble using this cream, please call the office. If you have any other questions about this information, please do not hesitate to ask me before you leave the office.        Cryotherapy Aftercare  Wash gently with soap and water everyday.   Apply Vaseline and Band-Aid daily until healed.      Due to recent changes in healthcare laws, you may see results of your pathology and/or laboratory studies on MyChart before the doctors have had a chance to review them. We understand that in some cases there may be results that are confusing or concerning to you. Please understand that not all results are received at the same time and often the doctors may need to interpret multiple results in order to provide you with the best  plan of care or course of treatment. Therefore, we ask that you please give us  2 business days to thoroughly review all your results before contacting the office for clarification. Should we see a critical lab result, you will be contacted sooner.   If You  Need Anything After Your Visit  If you have any questions or concerns for your doctor, please call our main line at [phone] and press option 4 to reach your doctor's medical assistant. If no one answers, please leave a voicemail as directed and we will return your call as soon as possible. Messages left after 4 pm will be answered the following business day.   You may also send us  a message via MyChart. We typically respond to MyChart messages within 1-2 business days.  For prescription refills, please ask your pharmacy to contact our office. Our fax number is [phone].  If you have an urgent issue when the clinic is closed that cannot wait until the next business day, you can page your doctor at the number below.    Please note that while we do our best to be available for urgent issues outside of office hours, we are not available 24/7.   If you have an urgent issue and are unable to reach us , you may choose to seek medical care at your doctor's office, retail clinic, urgent care center, or emergency room.  If you have a medical emergency, please immediately call 911 or go to the emergency department.  Pager Numbers  - Dr. Hester: [phone]  - Dr. Jackquline: [phone]  - Dr. Claudene: [phone]   In the event of inclement weather, please call our main line at [phone] for an update on the status of any delays or closures.  Dermatology Medication Tips: Please keep the boxes that topical medications come in in order to help keep track of the instructions about where and how to use these. Pharmacies typically print the medication instructions only on the boxes and not directly on the medication tubes.   If your medication is too expensive, please contact our office at [phone] option 4 or send us  a message through MyChart.   We are unable to tell what your co-pay for medications will be in advance as this is different depending on your insurance coverage. However,  we may be able to find a substitute medication at lower cost or fill out paperwork to get insurance to cover a needed medication.   If a prior authorization is required to get your medication covered by your insurance company, please allow us  1-2 business days to complete this process.  Drug prices often vary depending on where the prescription is filled and some pharmacies may offer cheaper prices.  The website www.goodrx.com contains coupons for medications through different pharmacies. The prices here do not account for what the cost may be with help from insurance (it may be cheaper with your insurance), but the website can give you the price if you did not use any insurance.  - You can print the associated coupon and take it with your prescription to the pharmacy.  - You may also stop by our office during regular business hours and pick up a GoodRx coupon card.  - If you need your prescription sent electronically to a different pharmacy, notify our office through [facility] or by phone at [phone] option 4.     Si Usted Necesita Algo Despus de Su Visita  Tambin puede enviarnos un mensaje a travs de Clinical cytogeneticist.  Por lo general respondemos a los mensajes de MyChart en el transcurso de 1 a 2 das hbiles.  Para renovar recetas, por favor pida a su farmacia que se ponga en contacto con nuestra oficina. Randi lakes de fax es [place] [phone].  Si tiene un asunto urgente cuando la clnica est cerrada y que no puede esperar hasta el siguiente da hbil, puede llamar/localizar a su doctor(a) al nmero que aparece a continuacin.   Por favor, tenga en cuenta que aunque hacemos todo lo posible para estar disponibles para asuntos urgentes fuera del horario de [place], no estamos disponibles las 24 horas del da, los 7 [address]  [address] de la [place].   Si tiene un problema urgente y no puede comunicarse con nosotros, puede optar por buscar atencin mdica  en el consultorio de su doctor(a), en una  clnica privada, en un centro de atencin urgente o en una sala de emergencias.  Si tiene Engineer, drilling, por favor llame inmediatamente al 911 o vaya a la sala de emergencias.  Nmeros de bper  - Dr. Hester: [phone]  - Dra. Jackquline: [phone]  - Dr. Claudene: [phone]   En caso de inclemencias del tiempo, por favor llame a landry capes principal al [phone] para una actualizacin sobre el [place] de cualquier retraso o cierre.  Consejos para la medicacin en dermatologa: Por favor, guarde las cajas en las que vienen los medicamentos de uso tpico para ayudarle a seguir las instrucciones sobre dnde y cmo usarlos. Las farmacias generalmente imprimen las instrucciones del medicamento slo en las cajas y no directamente en los tubos del [place].   Si su medicamento es muy caro, por favor, pngase en contacto con landry rieger llamando al [phone] y presione la opcin 4 o envenos un mensaje a travs de Clinical cytogeneticist.   No podemos decirle cul ser su copago por los medicamentos por adelantado ya que esto es diferente dependiendo de la cobertura de su seguro. Sin embargo, es posible que podamos encontrar un medicamento sustituto a Audiological scientist un formulario para que el seguro cubra el medicamento que se considera necesario.   Si se requiere una autorizacin previa para que su compaa de seguros malta su medicamento, por favor permtanos de 1 a 2 das hbiles para completar este proceso.  Los precios de los medicamentos varan con frecuencia dependiendo del Environmental consultant de dnde se surte la receta y alguna farmacias pueden ofrecer precios ms baratos.  El sitio web www.goodrx.com tiene cupones para medicamentos de Health and safety inspector. Los precios aqu no tienen en cuenta lo que podra costar con la ayuda del seguro (puede ser ms barato con su seguro), pero el sitio web puede darle el precio si no utiliz Tourist information centre manager.  - Puede imprimir el cupn correspondiente  y llevarlo con su receta a la farmacia.  - Tambin puede pasar por nuestra oficina durante el horario de atencin regular y Education officer, museum una tarjeta de cupones de GoodRx.  - Si necesita que su receta se enve electrnicamente a una farmacia diferente, informe a nuestra oficina a travs de MyChart de [place] o por telfono llamando al [phone] y presione la opcin 4.

## 2024-06-23 ENCOUNTER — Encounter: Payer: Self-pay | Admitting: Internal Medicine

## 2025-04-28 ENCOUNTER — Other Ambulatory Visit

## 2025-05-05 ENCOUNTER — Ambulatory Visit: Admitting: Dermatology

## 2025-05-12 ENCOUNTER — Ambulatory Visit: Admitting: Internal Medicine

## 2025-05-12 ENCOUNTER — Other Ambulatory Visit
